# Patient Record
Sex: Female | Born: 1945 | Race: White | Hispanic: No | Smoking: Never smoker
Health system: Southern US, Community
[De-identification: ages and names within clinical notes are randomized; demographics above are authoritative.]

## PROBLEM LIST (undated history)

## (undated) DIAGNOSIS — T4145XA Adverse effect of unspecified anesthetic, initial encounter: Secondary | ICD-10-CM

## (undated) DIAGNOSIS — R21 Rash and other nonspecific skin eruption: Secondary | ICD-10-CM

## (undated) DIAGNOSIS — K219 Gastro-esophageal reflux disease without esophagitis: Secondary | ICD-10-CM

## (undated) DIAGNOSIS — R002 Palpitations: Secondary | ICD-10-CM

## (undated) DIAGNOSIS — J387 Other diseases of larynx: Secondary | ICD-10-CM

## (undated) DIAGNOSIS — G243 Spasmodic torticollis: Secondary | ICD-10-CM

## (undated) DIAGNOSIS — T8859XA Other complications of anesthesia, initial encounter: Secondary | ICD-10-CM

## (undated) DIAGNOSIS — A048 Other specified bacterial intestinal infections: Secondary | ICD-10-CM

## (undated) DIAGNOSIS — K5730 Diverticulosis of large intestine without perforation or abscess without bleeding: Secondary | ICD-10-CM

## (undated) DIAGNOSIS — K449 Diaphragmatic hernia without obstruction or gangrene: Secondary | ICD-10-CM

## (undated) DIAGNOSIS — L309 Dermatitis, unspecified: Secondary | ICD-10-CM

## (undated) DIAGNOSIS — Z98811 Dental restoration status: Secondary | ICD-10-CM

## (undated) DIAGNOSIS — Z87442 Personal history of urinary calculi: Secondary | ICD-10-CM

## (undated) DIAGNOSIS — R0982 Postnasal drip: Secondary | ICD-10-CM

## (undated) DIAGNOSIS — R1310 Dysphagia, unspecified: Secondary | ICD-10-CM

## (undated) DIAGNOSIS — I10 Essential (primary) hypertension: Secondary | ICD-10-CM

## (undated) DIAGNOSIS — E785 Hyperlipidemia, unspecified: Secondary | ICD-10-CM

## (undated) DIAGNOSIS — F419 Anxiety disorder, unspecified: Secondary | ICD-10-CM

## (undated) HISTORY — DX: Gastro-esophageal reflux disease without esophagitis: K21.9

## (undated) HISTORY — DX: Diverticulosis of large intestine without perforation or abscess without bleeding: K57.30

## (undated) HISTORY — DX: Hyperlipidemia, unspecified: E78.5

## (undated) HISTORY — DX: Other specified bacterial intestinal infections: A04.8

## (undated) HISTORY — DX: Diaphragmatic hernia without obstruction or gangrene: K44.9

---

## 1981-07-08 HISTORY — PX: TRANSPHENOIDAL / TRANSNASAL HYPOPHYSECTOMY / RESECTION PITUITARY TUMOR: SUR1382

## 1990-07-08 HISTORY — PX: OOPHORECTOMY: SHX86

## 1991-07-09 HISTORY — PX: ABDOMINAL HYSTERECTOMY: SHX81

## 1999-11-22 ENCOUNTER — Encounter: Admission: RE | Admit: 1999-11-22 | Discharge: 1999-11-22 | Payer: Self-pay | Admitting: Urology

## 1999-11-22 ENCOUNTER — Encounter: Payer: Self-pay | Admitting: Urology

## 2000-01-16 ENCOUNTER — Encounter: Payer: Self-pay | Admitting: Internal Medicine

## 2000-01-16 ENCOUNTER — Encounter: Payer: Self-pay | Admitting: Orthopedic Surgery

## 2000-01-16 ENCOUNTER — Inpatient Hospital Stay (HOSPITAL_COMMUNITY): Admission: EM | Admit: 2000-01-16 | Discharge: 2000-01-17 | Payer: Self-pay | Admitting: Internal Medicine

## 2000-01-16 HISTORY — PX: ORIF ULNAR FRACTURE: SHX5417

## 2000-04-28 ENCOUNTER — Encounter: Payer: Self-pay | Admitting: Gastroenterology

## 2000-04-28 ENCOUNTER — Encounter (INDEPENDENT_AMBULATORY_CARE_PROVIDER_SITE_OTHER): Payer: Self-pay

## 2000-04-28 ENCOUNTER — Other Ambulatory Visit: Admission: RE | Admit: 2000-04-28 | Discharge: 2000-04-28 | Payer: Self-pay | Admitting: Gastroenterology

## 2000-05-28 ENCOUNTER — Encounter: Admission: RE | Admit: 2000-05-28 | Discharge: 2000-05-28 | Payer: Self-pay | Admitting: Urology

## 2000-05-28 ENCOUNTER — Encounter: Payer: Self-pay | Admitting: Urology

## 2000-06-17 ENCOUNTER — Encounter: Admission: RE | Admit: 2000-06-17 | Discharge: 2000-06-17 | Payer: Self-pay | Admitting: Urology

## 2000-06-17 ENCOUNTER — Encounter: Payer: Self-pay | Admitting: Urology

## 2000-12-10 ENCOUNTER — Encounter: Payer: Self-pay | Admitting: Urology

## 2000-12-10 ENCOUNTER — Encounter: Admission: RE | Admit: 2000-12-10 | Discharge: 2000-12-10 | Payer: Self-pay | Admitting: Urology

## 2001-09-11 ENCOUNTER — Encounter: Admission: RE | Admit: 2001-09-11 | Discharge: 2001-09-11 | Payer: Self-pay | Admitting: Internal Medicine

## 2001-09-11 ENCOUNTER — Encounter: Payer: Self-pay | Admitting: Internal Medicine

## 2001-12-01 ENCOUNTER — Encounter: Admission: RE | Admit: 2001-12-01 | Discharge: 2001-12-01 | Payer: Self-pay | Admitting: Urology

## 2001-12-01 ENCOUNTER — Encounter: Payer: Self-pay | Admitting: Urology

## 2003-03-23 ENCOUNTER — Ambulatory Visit (HOSPITAL_COMMUNITY): Admission: RE | Admit: 2003-03-23 | Discharge: 2003-03-23 | Payer: Self-pay | Admitting: Gastroenterology

## 2003-03-23 ENCOUNTER — Encounter: Payer: Self-pay | Admitting: Gastroenterology

## 2004-06-19 ENCOUNTER — Ambulatory Visit: Payer: Self-pay | Admitting: Internal Medicine

## 2004-07-12 ENCOUNTER — Ambulatory Visit: Payer: Self-pay | Admitting: Internal Medicine

## 2004-07-20 ENCOUNTER — Ambulatory Visit: Payer: Self-pay | Admitting: Internal Medicine

## 2004-07-20 ENCOUNTER — Ambulatory Visit: Payer: Self-pay | Admitting: Family Medicine

## 2004-08-14 ENCOUNTER — Ambulatory Visit: Payer: Self-pay | Admitting: Internal Medicine

## 2004-08-24 ENCOUNTER — Ambulatory Visit: Payer: Self-pay | Admitting: Internal Medicine

## 2004-09-11 ENCOUNTER — Ambulatory Visit (HOSPITAL_COMMUNITY): Admission: RE | Admit: 2004-09-11 | Discharge: 2004-09-11 | Payer: Self-pay | Admitting: Internal Medicine

## 2004-09-11 ENCOUNTER — Ambulatory Visit: Payer: Self-pay | Admitting: Internal Medicine

## 2004-09-26 ENCOUNTER — Encounter: Admission: RE | Admit: 2004-09-26 | Discharge: 2004-09-26 | Payer: Self-pay | Admitting: Internal Medicine

## 2004-10-22 ENCOUNTER — Ambulatory Visit: Payer: Self-pay | Admitting: Internal Medicine

## 2005-01-10 ENCOUNTER — Ambulatory Visit: Payer: Self-pay | Admitting: Internal Medicine

## 2005-01-22 ENCOUNTER — Ambulatory Visit: Payer: Self-pay | Admitting: Internal Medicine

## 2005-03-01 ENCOUNTER — Ambulatory Visit: Payer: Self-pay | Admitting: Internal Medicine

## 2005-03-04 ENCOUNTER — Ambulatory Visit: Payer: Self-pay

## 2005-03-05 ENCOUNTER — Ambulatory Visit: Payer: Self-pay

## 2005-04-01 ENCOUNTER — Ambulatory Visit: Payer: Self-pay | Admitting: Internal Medicine

## 2005-04-12 ENCOUNTER — Ambulatory Visit: Payer: Self-pay | Admitting: Internal Medicine

## 2005-04-16 ENCOUNTER — Ambulatory Visit: Payer: Self-pay | Admitting: Gastroenterology

## 2005-05-03 ENCOUNTER — Ambulatory Visit: Payer: Self-pay | Admitting: Internal Medicine

## 2005-07-08 HISTORY — PX: CATARACT EXTRACTION, BILATERAL: SHX1313

## 2005-08-01 ENCOUNTER — Ambulatory Visit: Payer: Self-pay | Admitting: Internal Medicine

## 2005-08-20 ENCOUNTER — Ambulatory Visit: Payer: Self-pay | Admitting: Gastroenterology

## 2005-10-16 ENCOUNTER — Ambulatory Visit: Payer: Self-pay | Admitting: Gastroenterology

## 2005-10-28 ENCOUNTER — Ambulatory Visit: Payer: Self-pay | Admitting: Gastroenterology

## 2005-10-28 LAB — HM COLONOSCOPY

## 2006-03-04 ENCOUNTER — Ambulatory Visit: Payer: Self-pay | Admitting: Internal Medicine

## 2006-03-11 ENCOUNTER — Ambulatory Visit: Payer: Self-pay | Admitting: Internal Medicine

## 2006-04-25 ENCOUNTER — Ambulatory Visit: Payer: Self-pay | Admitting: Internal Medicine

## 2006-12-26 ENCOUNTER — Ambulatory Visit: Payer: Self-pay | Admitting: Internal Medicine

## 2006-12-26 LAB — CONVERTED CEMR LAB
ALT: 32 units/L (ref 0–40)
AST: 28 units/L (ref 0–37)
Albumin: 4 g/dL (ref 3.5–5.2)
Alkaline Phosphatase: 53 units/L (ref 39–117)
BUN: 12 mg/dL (ref 6–23)
Basophils Absolute: 0 10*3/uL (ref 0.0–0.1)
Basophils Relative: 0.1 % (ref 0.0–1.0)
CO2: 34 meq/L — ABNORMAL HIGH (ref 19–32)
Calcium: 9.6 mg/dL (ref 8.4–10.5)
Chloride: 104 meq/L (ref 96–112)
Creatinine, Ser: 0.6 mg/dL (ref 0.4–1.2)
Eosinophils Absolute: 0.3 10*3/uL (ref 0.0–0.6)
Eosinophils Relative: 6.3 % — ABNORMAL HIGH (ref 0.0–5.0)
GFR calc Af Amer: 131 mL/min
GFR calc non Af Amer: 108 mL/min
Glucose, Bld: 97 mg/dL (ref 70–99)
HCT: 42.8 % (ref 36.0–46.0)
Hemoglobin: 14.5 g/dL (ref 12.0–15.0)
Hgb A1c MFr Bld: 5.9 % (ref 4.6–6.0)
Lymphocytes Relative: 18.2 % (ref 12.0–46.0)
MCHC: 34 g/dL (ref 30.0–36.0)
MCV: 88.9 fL (ref 78.0–100.0)
Monocytes Absolute: 0.3 10*3/uL (ref 0.2–0.7)
Monocytes Relative: 7.2 % (ref 3.0–11.0)
Neutro Abs: 3.3 10*3/uL (ref 1.4–7.7)
Neutrophils Relative %: 68.2 % (ref 43.0–77.0)
Platelets: 220 10*3/uL (ref 150–400)
Potassium: 3.6 meq/L (ref 3.5–5.1)
RBC: 4.81 M/uL (ref 3.87–5.11)
RDW: 13.5 % (ref 11.5–14.6)
Sodium: 141 meq/L (ref 135–145)
TSH: 4.09 microintl units/mL (ref 0.35–5.50)
Total Bilirubin: 2 mg/dL — ABNORMAL HIGH (ref 0.3–1.2)
Total Protein: 7.2 g/dL (ref 6.0–8.3)
WBC: 4.8 10*3/uL (ref 4.5–10.5)

## 2007-01-24 ENCOUNTER — Encounter: Payer: Self-pay | Admitting: Internal Medicine

## 2007-01-24 DIAGNOSIS — F32A Depression, unspecified: Secondary | ICD-10-CM | POA: Insufficient documentation

## 2007-01-24 DIAGNOSIS — F329 Major depressive disorder, single episode, unspecified: Secondary | ICD-10-CM

## 2007-01-24 DIAGNOSIS — K219 Gastro-esophageal reflux disease without esophagitis: Secondary | ICD-10-CM | POA: Insufficient documentation

## 2007-01-24 DIAGNOSIS — E785 Hyperlipidemia, unspecified: Secondary | ICD-10-CM | POA: Insufficient documentation

## 2007-01-24 DIAGNOSIS — I10 Essential (primary) hypertension: Secondary | ICD-10-CM | POA: Insufficient documentation

## 2007-01-24 DIAGNOSIS — Z87442 Personal history of urinary calculi: Secondary | ICD-10-CM | POA: Insufficient documentation

## 2007-03-04 ENCOUNTER — Ambulatory Visit: Payer: Self-pay | Admitting: Gastroenterology

## 2007-04-01 ENCOUNTER — Ambulatory Visit: Payer: Self-pay | Admitting: Internal Medicine

## 2007-04-01 LAB — CONVERTED CEMR LAB
ALT: 27 units/L (ref 0–35)
AST: 24 units/L (ref 0–37)
Alkaline Phosphatase: 58 units/L (ref 39–117)
Cholesterol: 189 mg/dL (ref 0–200)
HDL: 56.2 mg/dL (ref >39.0)
LDL Cholesterol: 114 mg/dL — ABNORMAL HIGH (ref 0–99)
Total Bilirubin: 2.1 mg/dL — ABNORMAL HIGH (ref 0.3–1.2)
Total CHOL/HDL Ratio: 3.4
Triglycerides: 93 mg/dL (ref 0–149)
VLDL: 19 mg/dL (ref 0–40)

## 2007-04-20 ENCOUNTER — Ambulatory Visit: Payer: Self-pay | Admitting: Internal Medicine

## 2007-09-02 ENCOUNTER — Telehealth: Payer: Self-pay | Admitting: Internal Medicine

## 2007-10-07 DIAGNOSIS — K5730 Diverticulosis of large intestine without perforation or abscess without bleeding: Secondary | ICD-10-CM | POA: Insufficient documentation

## 2007-11-16 ENCOUNTER — Telehealth: Payer: Self-pay | Admitting: Internal Medicine

## 2008-02-19 ENCOUNTER — Encounter: Payer: Self-pay | Admitting: Internal Medicine

## 2008-03-03 ENCOUNTER — Encounter: Payer: Self-pay | Admitting: Internal Medicine

## 2008-03-28 ENCOUNTER — Ambulatory Visit: Payer: Self-pay | Admitting: Internal Medicine

## 2008-03-28 ENCOUNTER — Telehealth: Payer: Self-pay | Admitting: Gastroenterology

## 2008-03-28 DIAGNOSIS — M810 Age-related osteoporosis without current pathological fracture: Secondary | ICD-10-CM | POA: Insufficient documentation

## 2008-04-12 ENCOUNTER — Ambulatory Visit: Payer: Self-pay | Admitting: Internal Medicine

## 2008-05-16 ENCOUNTER — Telehealth: Payer: Self-pay | Admitting: Gastroenterology

## 2008-06-14 ENCOUNTER — Telehealth: Payer: Self-pay | Admitting: Internal Medicine

## 2008-06-14 ENCOUNTER — Ambulatory Visit: Payer: Self-pay | Admitting: Gastroenterology

## 2009-01-23 ENCOUNTER — Telehealth: Payer: Self-pay | Admitting: Family Medicine

## 2009-03-23 ENCOUNTER — Telehealth: Payer: Self-pay | Admitting: Gastroenterology

## 2009-03-28 ENCOUNTER — Telehealth: Payer: Self-pay | Admitting: Internal Medicine

## 2009-05-01 ENCOUNTER — Ambulatory Visit: Payer: Self-pay | Admitting: Internal Medicine

## 2009-06-07 ENCOUNTER — Ambulatory Visit: Payer: Self-pay | Admitting: Internal Medicine

## 2009-06-07 DIAGNOSIS — R040 Epistaxis: Secondary | ICD-10-CM | POA: Insufficient documentation

## 2009-06-07 LAB — CONVERTED CEMR LAB
Albumin: 4.2 g/dL (ref 3.5–5.2)
Alkaline Phosphatase: 57 units/L (ref 39–117)
BUN: 10 mg/dL (ref 6–23)
Basophils Relative: 0.2 % (ref 0.0–3.0)
CO2: 34 meq/L — ABNORMAL HIGH (ref 19–32)
Chloride: 101 meq/L (ref 96–112)
Cholesterol: 210 mg/dL — ABNORMAL HIGH (ref 0–200)
Hemoglobin: 14.1 g/dL (ref 12.0–15.0)
Lymphocytes Relative: 15.7 % (ref 12.0–46.0)
Monocytes Relative: 5 % (ref 3.0–12.0)
Neutro Abs: 5.3 10*3/uL (ref 1.4–7.7)
Potassium: 3.5 meq/L (ref 3.5–5.1)
RBC: 4.58 M/uL (ref 3.87–5.11)
TSH: 3.33 microintl units/mL (ref 0.35–5.50)
Total CHOL/HDL Ratio: 3
Triglycerides: 92 mg/dL (ref 0.0–149.0)
VLDL: 18.4 mg/dL (ref 0.0–40.0)

## 2009-06-10 ENCOUNTER — Encounter: Payer: Self-pay | Admitting: Internal Medicine

## 2009-06-16 ENCOUNTER — Encounter: Payer: Self-pay | Admitting: Internal Medicine

## 2009-06-21 ENCOUNTER — Telehealth (INDEPENDENT_AMBULATORY_CARE_PROVIDER_SITE_OTHER): Payer: Self-pay | Admitting: *Deleted

## 2009-09-27 ENCOUNTER — Telehealth: Payer: Self-pay | Admitting: Gastroenterology

## 2009-10-03 ENCOUNTER — Telehealth: Payer: Self-pay | Admitting: Internal Medicine

## 2009-11-02 ENCOUNTER — Telehealth: Payer: Self-pay | Admitting: Internal Medicine

## 2009-11-03 ENCOUNTER — Ambulatory Visit: Payer: Self-pay | Admitting: Internal Medicine

## 2009-12-29 ENCOUNTER — Ambulatory Visit: Payer: Self-pay | Admitting: Internal Medicine

## 2009-12-29 DIAGNOSIS — J309 Allergic rhinitis, unspecified: Secondary | ICD-10-CM | POA: Insufficient documentation

## 2010-01-11 ENCOUNTER — Telehealth: Payer: Self-pay | Admitting: Internal Medicine

## 2010-03-30 ENCOUNTER — Ambulatory Visit: Payer: Self-pay | Admitting: Internal Medicine

## 2010-04-24 ENCOUNTER — Telehealth: Payer: Self-pay | Admitting: Internal Medicine

## 2010-05-03 ENCOUNTER — Telehealth: Payer: Self-pay | Admitting: Gastroenterology

## 2010-05-24 ENCOUNTER — Encounter (INDEPENDENT_AMBULATORY_CARE_PROVIDER_SITE_OTHER): Payer: Self-pay | Admitting: *Deleted

## 2010-05-24 ENCOUNTER — Ambulatory Visit: Payer: Self-pay | Admitting: Gastroenterology

## 2010-05-24 DIAGNOSIS — M436 Torticollis: Secondary | ICD-10-CM | POA: Insufficient documentation

## 2010-07-23 ENCOUNTER — Ambulatory Visit: Admit: 2010-07-23 | Payer: Self-pay | Admitting: Gastroenterology

## 2010-07-29 ENCOUNTER — Encounter: Payer: Self-pay | Admitting: Otolaryngology

## 2010-07-29 ENCOUNTER — Encounter: Payer: Self-pay | Admitting: Internal Medicine

## 2010-08-07 NOTE — Progress Notes (Signed)
Summary: Switch Aciphex to a Generic   Medications Added OMEPRAZOLE 20 MG CPDR (OMEPRAZOLE) 1 by mouth qd       Phone Note Call from Patient Call back at Home Phone [phone] Call back at or cell [phone]   Call For: DR Jacqualine Weichel Reason for Call: Talk to Nurse Summary of Call: Only has 5 aciphex pills left. Uses [facility] mail order. They told her that if she was to switch to generic her medicine would be free. She has been paying $70 for the aciphex so would really like to try to get the free generic instead. Initial call taken by: Leanor Kail [facility],  September 27, 2009 8:25 AM  Follow-up for Phone Call        any is .Marland Kitchenok Follow-up by: Mardella Layman MD FACG,  September 27, 2009 8:38 AM  Additional Follow-up for Phone Call Additional follow up Details #1::        Med changed to omeprazole.  Rxsent to [facility]  Pt notified. Additional Follow-up by: Ashok Cordia RN,  September 27, 2009 8:53 AM    New/Updated Medications: OMEPRAZOLE 20 MG CPDR (OMEPRAZOLE) 1 by mouth qd Prescriptions: OMEPRAZOLE 20 MG CPDR (OMEPRAZOLE) 1 by mouth qd  #90 x 3   Entered by:   Ashok Cordia RN   Authorized by:   Mardella Layman MD [facility]   Signed by:   Ashok Cordia RN on 09/27/2009   Method used:   Electronically to        [facility]* (mail-order)             ,          Ph: [phone]       Fax: [phone]   RxID:   [id number]

## 2010-08-07 NOTE — Letter (Signed)
Summary: [facility] Instructions  [facility]  [address] [place], [place] [address]   Phone: [phone]  Fax: [phone]       JANILLE DRAUGHON    [birth year]    MRN: [id number]        Procedure Day /Date: Monday 07/23/2010     Arrival Time: 9:30am     Procedure Time: 10:30am     Location of Procedure:                    X  [facility] (4th Floor)                        PREPARATION FOR COLONOSCOPY WITH MOVIPREP   Starting 5 days prior to your procedure 07/18/2010 do not eat nuts, seeds, popcorn, corn, beans, peas,  salads, or any raw vegetables.  Do not take any fiber supplements (e.g. Metamucil, Citrucel, and Benefiber).  THE DAY BEFORE YOUR PROCEDURE         DATE: 07/22/2010  DAY: Sunday  1.  Drink clear liquids the entire day-NO SOLID FOOD  2.  Do not drink anything colored red or purple.  Avoid juices with pulp.  No orange juice.  3.  Drink at least 64 oz. (8 glasses) of fluid/clear liquids during the day to prevent dehydration and help the prep work efficiently.  CLEAR LIQUIDS INCLUDE: Water Jello Ice Popsicles Tea (sugar ok, no milk/cream) Powdered fruit flavored drinks Coffee (sugar ok, no milk/cream) Gatorade Juice: apple, white grape, white cranberry  Lemonade Clear bullion, consomm, broth Carbonated beverages (any kind) Strained chicken noodle soup Hard Candy                             4.  In the morning, mix first dose of MoviPrep solution:    Empty 1 Pouch A and 1 Pouch B into the disposable container    Add lukewarm drinking water to the top line of the container. Mix to dissolve    Refrigerate (mixed solution should be used within 24 hrs)  5.  Begin drinking the prep at 5:00 p.m. The MoviPrep container is divided by 4 marks.   Every 15 minutes drink the solution down to the next mark (approximately 8 oz) until the full liter is complete.   6.  Follow completed prep with 16 oz of clear liquid of your choice (Nothing  red or purple).  Continue to drink clear liquids until bedtime.  7.  Before going to bed, mix second dose of MoviPrep solution:    Empty 1 Pouch A and 1 Pouch B into the disposable container    Add lukewarm drinking water to the top line of the container. Mix to dissolve    Refrigerate  THE DAY OF YOUR PROCEDURE      DATE: 07/23/2010  DAY: Monday  Beginning at 5:30am (5 hours before procedure):         1. Every 15 minutes, drink the solution down to the next mark (approx 8 oz) until the full liter is complete.  2. Follow completed prep with 16 oz. of clear liquid of your choice.    3. You may drink clear liquids until 8:30am (2 HOURS BEFORE PROCEDURE).   MEDICATION INSTRUCTIONS  Unless otherwise instructed, you should take regular prescription medications with a small sip of water   as early as possible the morning of your procedure.  OTHER INSTRUCTIONS  You will need a responsible adult at least 66 years of age to accompany you and drive you home.   This person must remain in the waiting room during your procedure.  Wear loose fitting clothing that is easily removed.  Leave jewelry and other valuables at home.  However, you may wish to bring a book to read or  an iPod/MP3 player to listen to music as you wait for your procedure to start.  Remove all body piercing jewelry and leave at home.  Total time from sign-in until discharge is approximately 2-3 hours.  You should go home directly after your procedure and rest.  You can resume normal activities the  day after your procedure.  The day of your procedure you should not:   Drive   Make legal decisions   Operate machinery   Drink alcohol   Return to work  You will receive specific instructions about eating, activities and medications before you leave.    The above instructions have been reviewed and explained to me by   _______________________    I fully understand and can verbalize these  instructions _____________________________ Date _________

## 2010-08-07 NOTE — Assessment & Plan Note (Signed)
Summary: flu shot-lb   Nurse Visit   Allergies: 1)  ! Codeine 2)  ! Penicillin  Orders Added: 1)  Flu Vaccine 66yrs + MEDICARE PATIENTS [Q2039] 2)  Administration Flu vaccine - MCR [G0008]      Flu Vaccine Consent Questions     Do you have a history of severe allergic reactions to this vaccine? no    Any prior history of allergic reactions to egg and/or gelatin? no    Do you have a sensitivity to the preservative Thimersol? no    Do you have a past history of Guillan-Barre Syndrome? no    Do you currently have an acute febrile illness? no    Have you ever had a severe reaction to latex? no    Vaccine information given and explained to patient? yes    Are you currently pregnant? no    Lot Number:[id number]   Exp Date:01/05/2011   Site Given  Left Deltoid IMu

## 2010-08-07 NOTE — Progress Notes (Signed)
  Phone Note Refill Request Message from:  Patient on January 11, 2010 8:26 AM     New/Updated Medications: VERAMYST 27.5 MCG/SPRAY SUSP (FLUTICASONE FUROATE) 1 spray two times a day as needed Prescriptions: VERAMYST 27.5 MCG/SPRAY SUSP (FLUTICASONE FUROATE) 1 spray two times a day as needed  #1 month x 3   Entered by:   Ami Bullins CMA   Authorized by:   Jacques Navy MD   Signed by:   Bill Salinas CMA on 01/11/2010   Method used:   [facility] [phone][facility] (retail)       [address]       [place], [place]  [address]       Ph: [phone]       Fax: [phone]   RxID:   [id number]

## 2010-08-07 NOTE — Assessment & Plan Note (Signed)
Summary: cough,cold/cd   Vital Signs:  Patient profile:   65 year old female Height:      63 inches Weight:      189 pounds BMI:     33.60 O2 Sat:      95 % on Room air Temp:     98.1 degrees F oral Pulse rate:   105 / minute BP sitting:   148 / 82  (left arm) Cuff size:   regular  Vitals Entered By: Bill Salinas CMA (November 03, 2009 1:52 PM)  O2 Flow:  Room air  Primary Care Provider:  Jacques Navy MD   History of Present Illness: Patient with a cold with sinus congestion, headach, gagging and choking cough associated with chest pain. Her sputum has been tenacious but clear. She had been taking robitussin at home. She had been instructed to take 1 tsp robitussin, loratadine and mucinex.   Current Medications (verified): 1)  Tums Calcium For Life Bone 750 Mg  Chew (Calcium Carbonate Antacid) .... 2-4 Once Daily 2)  Simvastatin 20 Mg Tabs (Simvastatin) .[name].. 1 By Mouth Once Daily 3)  Hydrochlorothiazide 25 Mg Tabs (Hydrochlorothiazide) .[name].. 1 Once Daily 4)  Omeprazole 20 Mg Cpdr (Omeprazole) .[name].. 1 By Mouth Qd 5)  Tums 500 Mg  Chew (Calcium Carbonate Antacid) .... As Needed 6)  Vitamin D 1000 Unit Tabs (Cholecalciferol) .... Once Daily 7)  Hyomax-Ft 0.125 Mg Tbdp (Hyoscyamine Sulfate) .... Take One By Mouth As Needed 8)  Tussin Dm 100-10 Mg/44ml Syrp (Dextromethorphan-Guaifenesin) .... As Needed 9)  Claritin 10 Mg Tabs (Loratadine) .... As Needed  Allergies (verified): 1)  ! Codeine 2)  ! Penicillin  Past History:  Past Medical History: Last updated: 31-Mar-2008 HIATAL HERNIA (ICD-553.3) HELICOBACTER PYLORI INFECTION, HX OF (ICD-V12.71) ESOPHAGITIS, REFLUX (ICD-530.11) DIVERTICULOSIS, COLON (ICD-562.10) DEPRESSION (ICD-311) GERD (ICD-530.81) RENAL CALCULUS, HX OF (ICD-V13.01) HYPERTENSION (ICD-401.9) HYPERLIPIDEMIA (ICD-272.4)  Past Surgical History: Last updated: 06/14/2008 Hysterectomy pituitary tumor surgery  arm surgery c section  Family History: Last  updated: 03-31-08 father - deceased @ 50: ITP, HTN mother - deceased @ 73: CAD/MI, colon cancer,  Breast cancer Neg - DM in first degree kin  Social History: Last updated: 03-31-2008 HSG married -'1- 39yrs, divorced 1 daughter - '85; no grandchildren work: part-time for [employer] - alone, but adjoins daughter  Review of Systems       The patient complains of fever, hoarseness, chest pain, and prolonged cough.  The patient denies anorexia, weight loss, weight gain, dyspnea on exertion, peripheral edema, hemoptysis, abdominal pain, incontinence, muscle weakness, difficulty walking, unusual weight change, and angioedema.    Physical Exam  General:  WNWD white female in no distress Head:  normocephalic and atraumatic.   Eyes:  vision grossly intact, pupils equal, pupils round, and corneas and lenses clear.   Ears:  R ear normal and L ear normal.   Nose:  no external deformity and no external erythema.   Mouth:  no oral lesions, posterior pharynx Neck:  supple, full ROM, and no thyromegaly.   Chest Wall:  no deformities.   Lungs:  normal respiratory effort, no intercostal retractions, no accessory muscle use, normal breath sounds, no crackles, and no wheezes.   Heart:  regualr tachycardia, II/VI systolic murmur. Msk:  normal ROM, no joint tenderness, and no redness over joints.   Pulses:  2+ radial pulse Neurologic:  alert & oriented X3, cranial nerves II-XII intact, gait normal, and DTRs symmetrical and normal.  Skin:  turgor normal.   Cervical Nodes:  no anterior cervical adenopathy and no posterior cervical adenopathy.   Psych:  Oriented X3, normally interactive, and good eye contact.     Impression & Recommendations:  Problem # 1:  VIRAL URI (ICD-465.9) Patient presentatiion and exam c/w viral URI.  Plan - supportive care - see pt. instr.   Her updated medication list for this problem includes:    Benzonatate 100 Mg Caps (Benzonatate) .[name]... 1 by  mouth three times a day for cough    Claritin 10 Mg Tabs (Loratadine) .[name]... As needed  Complete Medication List: 1)  Tums Calcium For Life Bone 750 Mg Chew (Calcium carbonate antacid) .... 2-4 once daily 2)  Simvastatin 20 Mg Tabs (Simvastatin) .[name].. 1 by mouth once daily 3)  Hydrochlorothiazide 25 Mg Tabs (Hydrochlorothiazide) .[name].. 1 once daily 4)  Omeprazole 20 Mg Cpdr (Omeprazole) .[name].. 1 by mouth qd 5)  Tums 500 Mg Chew (Calcium carbonate antacid) .... As needed 6)  Vitamin D 1000 Unit Tabs (Cholecalciferol) .... Once daily 7)  Hyomax-ft 0.125 Mg Tbdp (Hyoscyamine sulfate) .... Take one by mouth as needed 8)  Benzonatate 100 Mg Caps (Benzonatate) .[name].. 1 by mouth three times a day for cough 9)  Claritin 10 Mg Tabs (Loratadine) .... As needed  Patient Instructions: 1)  Viral upper respiratory infection - no sign of bacterial infection, thus no need for antibiotics. Plan - phenergan with codein cough syrup 1 tsp every 6 hours as needed for cough; benzonatate 100mg  three times a day for cough;  mucinex two tablets AM and PM to help thin secretions; sudafed 30mg  three times a day as needed for sinus congestion; hydrate; vitamin C 1500mg  daily.  Prescriptions: BENZONATATE 100 MG CAPS (BENZONATATE) 1 by mouth three times a day for cough  #30 x 1   Entered and Authorized by:   Jacques Navy MD   Signed by:   Jacques Navy MD on 11/03/2009   Method used:   Print then Give to Patient   RxID:   [id number]

## 2010-08-07 NOTE — Progress Notes (Signed)
Summary: Mucus  Phone Note Call from Patient   Summary of Call: Pt has office visit w/Dr Bernardina Cacho for eval tomorrow pm. She is req suggestions for tonight. C/o cough and increasing amounts of mucus causing gagging. Wants to know if it is ok to try benadryl tonight or other suggestions?  Initial call taken by: Lamar Sprinkles, CMA,  November 02, 2009 3:20 PM  Follow-up for Phone Call        OK to try loratadine 10mg  daily, take dose tonight, for drainage; Robitussin DM ( or equivalent) 1 tsp q 6 hours.   patient called. Follow-up by: Jacques Navy MD,  November 02, 2009 6:42 PM

## 2010-08-07 NOTE — Progress Notes (Signed)
Summary: renew rx   Phone Note Call from Patient Call back at Home Phone [phone]   Caller: Patient Call For: Dr. Jarold Motto Reason for Call: Talk to Nurse Summary of Call: needs rx for [facility]cyamine [facility] [place] Initial call taken by: Vallarie Mare,  May 03, 2010 11:04 AM  Follow-up for Phone Call        rx refilled and pt made an appt for 05/24/2010 Follow-up by: Harlow Mares CMA (AAMA),  May 03, 2010 11:35 AM    Prescriptions: HYOMAX-FT 0.125 MG TBDP (HYOSCYAMINE SULFATE) take one by mouth as needed  #90 x 0   Entered by:   Harlow Mares CMA (AAMA)   Authorized by:   Mardella Layman MD [facility]   Signed by:   Harlow Mares CMA (AAMA) on 05/03/2010   Method used:   [facility]        [facility] [phone]* (retail)       [address]       [place], [place]  [address]       Ph: [phone]       Fax: [phone]   RxID:   [id number]

## 2010-08-07 NOTE — Progress Notes (Signed)
Summary: Omeprazole  Phone Note Call from Patient   Summary of Call: Pt was changed from aciphex to omeprazole. She wants to make sure Dr Debby Bud thinks this is an ok change keeping in mind her current medications.  Initial call taken by: Lamar Sprinkles, CMA,  October 03, 2009 3:31 PM  Follow-up for Phone Call        omperazole, a 1st cousin of aciphex, is just fine. May need to take two = 40mg  to see the same efficacy as aciphex. Can have Rx for # 60 with as needed refills Follow-up by: Jacques Navy MD,  October 04, 2009 8:59 AM  Additional Follow-up for Phone Call Additional follow up Details #1::        Patient notified and states that Dr. Jarold Motto already gave her an prescription. Additional Follow-up by: Lucious Groves,  October 04, 2009 9:48 AM

## 2010-08-07 NOTE — Progress Notes (Signed)
Summary: Simvastatin  Phone Note Call from Patient   Summary of Call: Pt has been cutting simvastatin 40 in half. She is req rx fro 20 mg tabs to go to [facility]. OK?  Initial call taken by: Lamar Sprinkles, CMA,  October 03, 2009 10:06 AM  Follow-up for Phone Call        OK  simvastatin 20mg  # 90 refill x 3, sig 1 by mouth qPM Follow-up by: Jacques Navy MD,  October 03, 2009 12:47 PM    New/Updated Medications: SIMVASTATIN 20 MG TABS (SIMVASTATIN) 1 by mouth once daily Prescriptions: SIMVASTATIN 20 MG TABS (SIMVASTATIN) 1 by mouth once daily  #90 x 3   Entered by:   Lamar Sprinkles, CMA   Authorized by:   Jacques Navy MD   Signed by:   Lamar Sprinkles, CMA on 10/03/2009   Method used:   Faxed to ...       [facility]* (mail-order)             ,          Ph: [phone]       Fax: [phone]   RxID:   [id number]

## 2010-08-07 NOTE — Assessment & Plan Note (Signed)
Summary: follow up 530.81/562.10/lk    History of Present Illness Visit Type: Follow-up Visit Primary GI MD: Sheryn Bison MD FACP FAGA Primary Provider: Jacques Navy MD Chief Complaint: GERD , no problems; She wants to set up colon screening in Jan. 2012 History of Present Illness:   Very pleasant 65 year old Caucasian female with chronic gastroesophageal reflux disease doing well on omeprazole 20 mg a day. She also has diverticulosis coli, IBS, and uses Levsin on a p.r.n. basis. She has a family history colon cancer and is due for followup colonoscopy exam. She denies lower bowel problems at this time. She is followed closely by Dr. Illene Regulus.   GI Review of Systems    Reports acid reflux.      Denies abdominal pain, belching, bloating, chest pain, dysphagia with liquids, dysphagia with solids, heartburn, loss of appetite, nausea, vomiting, vomiting blood, weight loss, and  weight gain.        Denies anal fissure, black tarry stools, change in bowel habit, constipation, diarrhea, diverticulosis, fecal incontinence, heme positive stool, hemorrhoids, irritable bowel syndrome, jaundice, light color stool, liver problems, rectal bleeding, and  rectal pain.    Current Medications (verified): 1)  Tums Calcium For Life Bone 750 Mg  Chew (Calcium Carbonate Antacid) .... 2-4 Once Daily 2)  Simvastatin 20 Mg Tabs (Simvastatin) .[name].. 1 By Mouth Once Daily 3)  Hydrochlorothiazide 25 Mg Tabs (Hydrochlorothiazide) .[name].. 1 Once Daily 4)  Omeprazole 20 Mg Cpdr (Omeprazole) .[name].. 1 By Mouth Qd 5)  Tums 500 Mg  Chew (Calcium Carbonate Antacid) .... As Needed 6)  Vitamin D 1000 Unit Tabs (Cholecalciferol) .... Once Daily 7)  Hyomax-Ft 0.125 Mg Tbdp (Hyoscyamine Sulfate) .... Take One By Mouth As Needed 8)  Claritin 10 Mg Tabs (Loratadine) .... As Needed  Allergies (verified): 1)  ! Codeine 2)  ! Penicillin  Past History:  Past medical, surgical, family and social histories (including risk  factors) reviewed for relevance to current acute and chronic problems.  Past Medical History: Reviewed history from 03/28/2008 and no changes required. HIATAL HERNIA (ICD-553.3) HELICOBACTER PYLORI INFECTION, HX OF (ICD-V12.71) ESOPHAGITIS, REFLUX (ICD-530.11) DIVERTICULOSIS, COLON (ICD-562.10) DEPRESSION (ICD-311) GERD (ICD-530.81) RENAL CALCULUS, HX OF (ICD-V13.01) HYPERTENSION (ICD-401.9) HYPERLIPIDEMIA (ICD-272.4)  Past Surgical History: Reviewed history from 06/14/2008 and no changes required. Hysterectomy pituitary tumor surgery  arm surgery c section  Family History: Reviewed history from 03/28/2008 and no changes required. father - deceased @ 38: ITP, HTN mother - deceased @ 42: CAD/MI, colon cancer,  Breast cancer Neg - DM in first degree kin  Social History: Reviewed history from 03/28/2008 and no changes required. HSG married -'82- 59yrs, divorced 1 daughter - '85; no grandchildren work: part-time for [employer] - alone, but adjoins daughter  Review of Systems       The patient complains of allergy/sinus and arthritis/joint pain.  The patient denies anemia, anxiety-new, back pain, blood in urine, breast changes/lumps, change in vision, confusion, cough, coughing up blood, depression-new, fainting, fatigue, fever, headaches-new, hearing problems, heart murmur, heart rhythm changes, itching, menstrual pain, muscle pains/cramps, night sweats, nosebleeds, pregnancy symptoms, shortness of breath, skin rash, sleeping problems, sore throat, swelling of feet/legs, swollen lymph glands, thirst - excessive , urination - excessive , urination changes/pain, urine leakage, vision changes, and voice change.    Vital Signs:  Patient profile:   65 year old female Height:      63 inches Weight:      193.13 pounds BMI:  34.34 Pulse rate:   60 / minute Pulse rhythm:   regular BP sitting:   136 / 78  (left arm) Cuff size:   regular  Vitals Entered By:  June Mc[place]ray CMA Duncan Dull) (May 24, 2010 10:41 AM)  Physical Exam  General:  Well developed, well nourished, no acute distress.healthy appearing.  healthy appearing.   Head:  Normocephalic and atraumatic. Eyes:  PERRLA, no icterus.exam deferred to patient's ophthalmologist.  exam deferred to patient's ophthalmologist.   Lungs:  Clear throughout to auscultation. Heart:  Regular rate and rhythm; no murmurs, rubs,  or bruits. Abdomen:  Soft, nontender and nondistended. No masses, hepatosplenomegaly or hernias noted. Normal bowel sounds. Extremities:  No clubbing, cyanosis, edema or deformities noted. Neurologic:  Alert and  oriented x4;  grossly normal neurologically. Psych:  Alert and cooperative. Normal mood and affect.   Impression & Recommendations:  Problem # 1:  ADENOCARCINOMA, COLON, FAMILY HX (ICD-V16.0) Assessment Unchanged Colonoscopy scheduled at her convenience. She does have some mild hemorrhoid problems and we will examine her rectum closed at the time of her colonoscopy.  Problem # 2:  GERD (ICD-530.81) Assessment: Improved continue reflect regime and daily omeprazole therapy. She does not need endoscopy at this time. Reflect regime again reviewed with patient.  Other Orders: Colonoscopy (Colon)  Patient Instructions: 1)  Copy sent to : Jacques Navy MD 2)  Your prescription(s) have been sent to you pharmacy.  3)  Your procedure has been scheduled for 07/23/2010, please follow the seperate instructions.  4)  Please continue current medications.  5)  [facility] Patient Information Guide given to patient.  6)  Colonoscopy and Flexible Sigmoidoscopy brochure given.  7)  Diet should be high in fiber ( fruits, vegetables, whole grains) but low in residue. Drink at least eight (8) glasses of water a day.  8)  The medication list was reviewed and reconciled.  All changed / newly prescribed medications were explained.  A complete medication list was provided  to the patient / caregiver. Prescriptions: HYOMAX-FT 0.125 MG TBDP (HYOSCYAMINE SULFATE) take one by mouth as needed  #90 x 3   Entered by:   Harlow Mares CMA (AAMA)   Authorized by:   Mardella Layman MD [facility]   Signed by:   Harlow Mares CMA (AAMA) on 05/24/2010   Method used:   [facility] [phone]* (retail)       [address]       [place], [place]  [address]       Ph: [phone]       Fax: [phone]   RxID:   [id number] MOVIPREP 100 GM  SOLR (PEG-KCL-NACL-NASULF-NA ASC-C) As per prep instructions.  #1 x 0   Entered by:   Harlow Mares CMA (AAMA)   Authorized by:   Mardella Layman MD [facility]   Signed by:   Harlow Mares CMA (AAMA) on 05/24/2010   Method used:   [facility]        [facility] [phone]* (retail)       [address]       [place], [place]  [address]       Ph: [phone]       Fax: [phone]   RxID:   [id number]

## 2010-08-07 NOTE — Assessment & Plan Note (Signed)
Summary: SINUS PRESSURE/CONGESTION/CD   Vital Signs:  Patient profile:   65 year old female Height:      63 inches Weight:      190 pounds BMI:     33.78 O2 Sat:      96 % on Room air Temp:     97.5 degrees F oral Pulse rate:   89 / minute BP sitting:   126 / 78  (left arm) Cuff size:   regular  Vitals Entered By: Bill Salinas CMA (December 29, 2009 10:08 AM)  O2 Flow:  Room air CC: pt here with c/o sinus drip, sinus pressure and hoarsness/ pt has seen two ENT mds that she is unhappy with. She has an appt sch with Dr Patterson Hammersmith ENT at [facility]     Last PAP Result pt has had hysterectomy   Primary Care Provider:  Jacques Navy MD  CC:  pt here with c/o sinus drip and sinus pressure and hoarsness/ pt has seen two ENT mds that she is unhappy with. She has an appt sch with Dr Patterson Hammersmith ENT at [facility].  History of Present Illness: Patient presents for evaluation of persistent nasal drainage, sinus congestion and laryngitis. She has seen Dr. Pollyann Kennedy in December - normal examination including vocal chords. she had use flonase nasal spray only once or twice and then stopped after developing epistaxis. Of note: she continues to have epistaxis when not using nasal spray. She has not had any fever, or purlulent drainage. She does work in a Administrator, arts" environment raising the possibility of alergy related problem.  She does report that the sinus drainage has a terrible odor and bad taste.   Current Medications (verified): 1)  Tums Calcium For Life Bone 750 Mg  Chew (Calcium Carbonate Antacid) .... 2-4 Once Daily 2)  Simvastatin 20 Mg Tabs (Simvastatin) .[name].. 1 By Mouth Once Daily 3)  Hydrochlorothiazide 25 Mg Tabs (Hydrochlorothiazide) .[name].. 1 Once Daily 4)  Omeprazole 20 Mg Cpdr (Omeprazole) .[name].. 1 By Mouth Qd 5)  Tums 500 Mg  Chew (Calcium Carbonate Antacid) .... As Needed 6)  Vitamin D 1000 Unit Tabs (Cholecalciferol) .... Once Daily 7)  Hyomax-Ft 0.125 Mg Tbdp (Hyoscyamine Sulfate) .... Take One By  Mouth As Needed 8)  Benzonatate 100 Mg Caps (Benzonatate) .[name].. 1 By Mouth Three Times A Day For Cough 9)  Claritin 10 Mg Tabs (Loratadine) .... As Needed  Allergies (verified): 1)  ! Codeine 2)  ! Penicillin  Past History:  Past Medical History: Last updated: 04/02/2008 HIATAL HERNIA (ICD-553.3) HELICOBACTER PYLORI INFECTION, HX OF (ICD-V12.71) ESOPHAGITIS, REFLUX (ICD-530.11) DIVERTICULOSIS, COLON (ICD-562.10) DEPRESSION (ICD-311) GERD (ICD-530.81) RENAL CALCULUS, HX OF (ICD-V13.01) HYPERTENSION (ICD-401.9) HYPERLIPIDEMIA (ICD-272.4)  Past Surgical History: Last updated: 06/14/2008 Hysterectomy pituitary tumor surgery  arm surgery c section  Family History: Last updated: 04/02/2008 father - deceased @ 80: ITP, HTN mother - deceased @ 55: CAD/MI, colon cancer,  Breast cancer Neg - DM in first degree kin  Social History: Last updated: 2008-04-02 HSG married -'56- 81yrs, divorced 1 daughter - '85; no grandchildren work: part-time for [employer] - alone, but adjoins daughter  Review of Systems       The patient complains of hoarseness and prolonged cough.  The patient denies anorexia, fever, weight loss, weight gain, chest pain, syncope, dyspnea on exertion, headaches, abdominal pain, muscle weakness, difficulty walking, depression, enlarged lymph nodes, and angioedema.    Physical Exam  General:  alert, well-developed, and  well-nourished.   Head:  no sinus tenderness Eyes:  corneas and lenses clear and no injection.   Neck:  supple and full ROM.   Lungs:  normal respiratory effort and normal breath sounds.   Heart:  normal rate and regular rhythm.   Skin:  turgor normal and color normal.   Cervical Nodes:  no anterior cervical adenopathy and no posterior cervical adenopathy.   Psych:  Oriented X3, memory intact for recent and remote, normally interactive, and good eye contact.     Impression & Recommendations:  Problem # 1:  ALLERGIC  RHINITIS (ICD-477.9) Symptoms are very likely to be allergic or vasomotor rhinnitis. Due to the bad smell and taste of mucus does suggest possibility of infection.  Plan - continue claritin.          trial of veramyst - if successful will Rx fluticasone          continue nasal saline spray           avelox 400mg  once daily x 7 (pcn allergic patient.          Continue omeprazole.   Her updated medication list for this problem includes:    Claritin 10 Mg Tabs (Loratadine) .[name]... As needed  Complete Medication List: 1)  Tums Calcium For Life Bone 750 Mg Chew (Calcium carbonate antacid) .... 2-4 once daily 2)  Simvastatin 20 Mg Tabs (Simvastatin) .[name].. 1 by mouth once daily 3)  Hydrochlorothiazide 25 Mg Tabs (Hydrochlorothiazide) .[name].. 1 once daily 4)  Omeprazole 20 Mg Cpdr (Omeprazole) .[name].. 1 by mouth qd 5)  Tums 500 Mg Chew (Calcium carbonate antacid) .... As needed 6)  Vitamin D 1000 Unit Tabs (Cholecalciferol) .... Once daily 7)  Hyomax-ft 0.125 Mg Tbdp (Hyoscyamine sulfate) .... Take one by mouth as needed 8)  Benzonatate 100 Mg Caps (Benzonatate) .[name].. 1 by mouth three times a day for cough 9)  Claritin 10 Mg Tabs (Loratadine) .... As needed 10)  Avelox 400 Mg Tabs (Moxifloxacin hcl) .[name].. 1 by mouth once daily x 7  Patient Instructions: 1)  laryngitis and post-nasal drip: most likely rhinnitis, either allergic or vasomotor. With the bad smell and taste of mucus may have infection.  Plan - avelox 400mg  once a day x 7 days for infection. Veramyst spray - one spray per nostril once a day (gargle after use). If this works we can prescribe the generic. Continue claritin, may use sudafed as needed, continue nasal saline spray and may use the benzonatate perles as needed.  Prescriptions: AVELOX 400 MG TABS (MOXIFLOXACIN HCL) 1 by mouth once daily x 7  #7 x 0   Entered and Authorized by:   Jacques Navy MD   Signed by:   Jacques Navy MD on 12/29/2009   Method used:   [facility]         [phone][facility] (retail)       [address]       [place], [place]  [address]       Ph: [phone]       Fax: [phone]   RxID:   [id number]    Preventive Care Screening  Pap Smear:    Date:  12/29/2009    Results:  pt has had hysterectomy

## 2010-08-07 NOTE — Progress Notes (Signed)
Summary: Nose pain X 1 day  Phone Note Call from Patient   Caller: Patient Summary of Call: pt left vm stating she woke up yesterday am with her nose hurting on top and a little swollen.  She states it is some better today but she wants to know what she can do for it.  She stopped using veramyst.......Marland Kitchenplease advise Initial call taken by: Lanier Prude, [facility]),  April 24, 2010 11:24 AM  Follow-up for Phone Call        OK to stop veramyst. Warm compresses may help. For persistent pain and swellng - OV Follow-up by: Jacques Navy MD,  April 24, 2010 1:21 PM  Additional Follow-up for Phone Call Additional follow up Details #1::        pt informed  Additional Follow-up by: Lanier Prude, [facility]),  April 24, 2010 2:05 PM

## 2010-08-29 ENCOUNTER — Other Ambulatory Visit: Payer: Self-pay | Admitting: Dermatology

## 2010-09-18 ENCOUNTER — Telehealth: Payer: Self-pay | Admitting: Gastroenterology

## 2010-09-18 ENCOUNTER — Telehealth: Payer: Self-pay | Admitting: Internal Medicine

## 2010-09-25 NOTE — Progress Notes (Signed)
Summary: Omeprazole refill   Phone Note Call from Patient Call back at Home Phone [phone]   Call For: Dr Jarold Motto Reason for Call: Refill Medication Summary of Call: Wants her omeprazole refill. Says nurse already know where to send her script to for her [facility], she didnt know. Initial call taken by: Leanor Kail [facility],  September 18, 2010 8:04 AM  Follow-up for Phone Call        rx sent Follow-up by: Harlow Mares CMA [facility]),  September 18, 2010 8:07 AM    Prescriptions: OMEPRAZOLE 20 MG CPDR (OMEPRAZOLE) 1 by mouth qd  #90 x 3   Entered by:   Harlow Mares CMA (AAMA)   Authorized by:   Mardella Layman MD [facility]   Signed by:   Harlow Mares CMA (AAMA) on 09/18/2010   Method used:   Electronically to        [facility]* (retail)             ,          Ph: [phone]       Fax: [phone]   RxID:   [id number]

## 2010-09-25 NOTE — Progress Notes (Signed)
Summary: REFILL & OV   Phone Note Call from Patient   Summary of Call: Patient is requesting refill of simvastatin to [facility], will be out in 2 wks. Wants to know if she needs labs?  Initial call taken by: Lamar Sprinkles, CMA,  September 18, 2010 9:54 AM  Follow-up for Phone Call        1. last OV 12/10 ! 2. Last labs 12/10! 3. OK to refill simvastatin for 90 days only. 4.  Needs lab: lipid 272.4, hepatic 995.20 , metabolic 401.9 5. Needs full phsyical exam in next 90 days. Follow-up by: Jacques Navy MD,  September 18, 2010 9:37 PM  Additional Follow-up for Phone Call Additional follow up Details #1::        left detailed vm on pt's home #, pt will need office visit w/labs after.  Additional Follow-up by: Lamar Sprinkles, CMA,  September 20, 2010 3:51 PM    Prescriptions: SIMVASTATIN 20 MG TABS (SIMVASTATIN) 1 by mouth once daily  #90 x 0   Entered by:   Lamar Sprinkles, CMA   Authorized by:   Jacques Navy MD   Signed by:   Lamar Sprinkles, CMA on 09/20/2010   Method used:   Electronically to        [facility]* (retail)             ,          Ph: [phone]       Fax: [phone]   RxID:   [id number]

## 2010-10-01 ENCOUNTER — Telehealth: Payer: Self-pay | Admitting: Gastroenterology

## 2010-10-01 ENCOUNTER — Other Ambulatory Visit: Payer: Self-pay | Admitting: Dermatology

## 2010-10-01 MED ORDER — OMEPRAZOLE 20 MG PO CPDR: 20.0000 mg | DELAYED_RELEASE_CAPSULE | Freq: Every day | ORAL | Status: DC

## 2010-10-01 NOTE — Telephone Encounter (Signed)
rx sent to [facility] per pt.

## 2010-10-05 ENCOUNTER — Other Ambulatory Visit: Payer: Self-pay | Admitting: *Deleted

## 2010-10-05 MED ORDER — OMEPRAZOLE 20 MG PO CPDR: 20.0000 mg | DELAYED_RELEASE_CAPSULE | Freq: Every day | ORAL | Status: DC

## 2010-10-05 NOTE — Telephone Encounter (Signed)
Addended by: Harlow Mares on: 10/05/2010 05:03 PM   Modules accepted: Orders

## 2010-10-05 NOTE — Telephone Encounter (Signed)
[facility]  

## 2010-10-08 ENCOUNTER — Telehealth: Payer: Self-pay | Admitting: *Deleted

## 2010-10-08 MED ORDER — SIMVASTATIN 20 MG PO TABS: 20.0000 mg | ORAL_TABLET | Freq: Every day | ORAL | Status: DC

## 2010-10-08 NOTE — Telephone Encounter (Signed)
Call[facility] who stated that they have no information on fill for pt's simvastatin. Called patient who advised that a rx refill is needed. RX sent to [facility]

## 2010-10-08 NOTE — Telephone Encounter (Signed)
Pt left vm - [facility] questions about pt's simvastatin and [facility] @ [phone]

## 2010-10-09 NOTE — Telephone Encounter (Signed)
Rx Error recvd from Pharmacy: Called [facility] and gave VO to pharmacist/John

## 2010-10-25 ENCOUNTER — Other Ambulatory Visit: Payer: Self-pay | Admitting: Otolaryngology

## 2010-10-25 ENCOUNTER — Ambulatory Visit
Admission: RE | Admit: 2010-10-25 | Discharge: 2010-10-25 | Disposition: A | Discharge status: Home / Self Care | Payer: Medicare Other | Source: Ambulatory Visit | Attending: Otolaryngology | Admitting: Otolaryngology

## 2010-10-25 DIAGNOSIS — J329 Chronic sinusitis, unspecified: Secondary | ICD-10-CM

## 2010-11-15 ENCOUNTER — Ambulatory Visit (INDEPENDENT_AMBULATORY_CARE_PROVIDER_SITE_OTHER): Payer: Medicare Other | Admitting: Internal Medicine

## 2010-11-15 ENCOUNTER — Other Ambulatory Visit (INDEPENDENT_AMBULATORY_CARE_PROVIDER_SITE_OTHER): Payer: Medicare Other

## 2010-11-15 VITALS — BP 144/88 | HR 92 | Temp 98.4°F | Wt 188.0 lb

## 2010-11-15 DIAGNOSIS — E859 Amyloidosis, unspecified: Secondary | ICD-10-CM

## 2010-11-15 LAB — CBC WITH DIFFERENTIAL/PLATELET
Basophils Relative: 0.4 % (ref 0.0–3.0)
Eosinophils Relative: 4.7 % (ref 0.0–5.0)
HCT: 42.9 % (ref 36.0–46.0)
Hemoglobin: 14.6 g/dL (ref 12.0–15.0)
Lymphs Abs: 1.2 10*3/uL (ref 0.7–4.0)
MCV: 90.3 fl (ref 78.0–100.0)
Monocytes Absolute: 0.5 10*3/uL (ref 0.1–1.0)
Monocytes Relative: 7 % (ref 3.0–12.0)
Neutro Abs: 4.5 10*3/uL (ref 1.4–7.7)
Platelets: 245 10*3/uL (ref 150.0–400.0)
RBC: 4.75 Mil/uL (ref 3.87–5.11)
WBC: 6.5 10*3/uL (ref 4.5–10.5)

## 2010-11-16 NOTE — Progress Notes (Signed)
  Subjective:    Patient ID: Katie Stout, female    DOB: 1946-01-19, 65 y.o.   MRN: [id number]  HPI Mrs. Nolton recently had ENT evaluation for chronic hoarseness. 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 She is here for further evaluation to rule out any underlying hematologic disease.  I have reviewed the patient's medical history in detail and updated the computerized patient record.    Review of Systems HEENT- no fever, sweats, weight loss Pul - hoarseness. No cough, SOB, DOE Cor - no chest pain or discomfort GI- stable without change MSK normal    Objective:   Physical Exam WNWD white woman in o distress HEENT - hoarse, no further exam - deferred to Dr. Marvetta Gibbons Cor - RRR       Assessment & Plan:  1. 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

## 2010-11-19 LAB — PROTEIN ELECTROPHORESIS, SERUM, WITH REFLEX
Alpha-2-Globulin: 11 % (ref 7.1–11.8)
Beta 2: 5.6 % (ref 3.2–6.5)
Beta Globulin: 7.5 % — ABNORMAL HIGH (ref 4.7–7.2)
Gamma Globulin: 13.5 % (ref 11.1–18.8)
Total Protein, Serum Electrophoresis: 7.4 g/dL (ref 6.0–8.3)

## 2010-11-20 NOTE — Assessment & Plan Note (Signed)
[facility]                         GASTROENTEROLOGY OFFICE NOTE   SHENEKA, SCHROM                      MRN:          [id number]  DATE:03/04/2007                            DOB:          13-May-1946    OFFICE ADD-ON NOTE   COMPLAINT:  Painful swallowing.   HISTORY:  Katie Stout is a pleasant 65 year old white female followed by Dr.  Jarold Motto.  She does have a history of chronic GERD and also  diverticulosis.  She was last seen in April of 2007 when she underwent  colonoscopy.  She did not have any polyps noted at that time, and was  found to have left colon diverticulosis.  Last EGD was done in October  of 2001.  She had reflux esophagitis and a small hiatal hernia.  She has  been on long-term AcipHex 20 mg q. a.m. and says this generally controls  her symptoms well.  Now, over the past 2 to 3 days, she has been having  a burning, raw sensation from her throat all the way to the substernal  area.  She denies any chest pain and says that this does not feel like  her usual acid reflux.  She has been having pain when swallowing as  well, with the burning sensation after swallowing any bolus of food.  She has not had any nausea or vomiting.  No fevers or chills.  She has  not been on any recent antibiotics.  Denies any new medications, but had  been taking Aleve for a couple of days about 4 to 5 days ago.  Over the  past few months, she denies any difficulty with dysphagia or  odynophagia.  She said her symptoms started after a particularly  stressful day, after she had gone for a job interview, which she had not  had to do for the past 33 years.  She has been using Tums in addition to  her AcipHex with little success.   The patient also mentions that off and on over the past couple of years,  she has periodically regurgitated a little piece of food, which she  feels comes up from somewhere in her esophageus.  She says that normally  this smells  awful, like it has been sitting in her esophageus for a long  time.   EXAM:  Well-developed white female in no acute distress.  Weight 193.8, blood pressure 130/86, pulse 88.  CARDIOVASCULAR:  Regular rate and rhythm with S1, S2.  PULMONARY:  Clear to A and P.  ABDOMEN:  Obese, soft.  She is very minimally tender in the subxiphoid  area.  There is no palpable mass or hepatosplenomegaly.  Bowel sounds  are active.  RECTAL:  Not done today.   IMPRESSION:  48. A 65 year old white female with chronic gastroesophageal reflux      disease with a 2 to 3 day history of dysphagia and odynophagia.  I      suspect she has an acute esophagitis, possibly exacerbated by      recent Aleve and rule out esophageal stricture.  2. Family history of colon cancer.  Up to date on colon screening.   PLAN:  1. Increase AcipHex to 20 mg b.i.d. for the next 2 to 3 weeks and then      try to drop back to once daily.  2. Add Carafate slurry 1 g between meals and at bedtime.  3. Schedule EGD with Dr. Jarold Motto and possible esophageal dilation.  4. Advised no aspirin or NSAID for the time being.      Mike Gip, PA-C  Electronically Signed      Barbette Hair. Arlyce Dice, MD,FACG  Electronically Signed   AE/MedQ  DD: 03/04/2007  DT: 03/05/2007  Job #: [id number]

## 2010-11-21 ENCOUNTER — Encounter: Payer: Self-pay | Admitting: Internal Medicine

## 2010-11-23 ENCOUNTER — Telehealth: Payer: Self-pay | Admitting: *Deleted

## 2010-11-23 NOTE — Op Note (Signed)
[place]  Patient:    Katie Stout, Katie Stout                      MRN: [id number] Proc. Date: 01/16/00 Adm. Date:  [id number] Attending:  Twana First                           Operative Report  PREOPERATIVE DIAGNOSIS:  Left ulna fracture with posterior radial head Monteggia fracture dislocation.  POSTOPERATIVE DIAGNOSIS:  Left ulna fracture with posterior radial head Monteggia fracture dislocation.  OPERATION PERFORMED:  Open reduction internal fixation of left ulna fracture and radial head dislocation.  SURGEON:  Elana Alm. Thurston Hole, M.D.  ASSISTANT:  Kirstin Adelberger, P.A.  ANESTHESIA:  General.  OPERATIVE TIME:  One hour.  COMPLICATIONS:  None.  DESCRIPTION OF PROCEDURE:  The patient was brought to the operating room on January 16, 2000 and placed on the operating table in supine position.  After an adequate level of general anesthesia was obtained, her radial head posterior dislocation was reduced closed.  The left arm was prepped using sterile Betadine and draped using sterile technique.  She received vancomycin 1 gm IV preoperatively for prophylaxis.  The arm was exsanguinated and a tourniquet elevated 250 mmHg.  Initially through a 7 cm longitudinal incision based on the posterior ulnar border of the ulna proximally initially exposure was made. The underlying subcutaneous tissues were incised in line with the skin incisoin.  The intermuscular septum was incised longitudinally and the ulnar fracture was exposed.  Ulnar nerve carefully retracted.  The fracture was reduced in an anatomic position and held there while a 7-0 one third tubular plate was placed on the posterior lateral surface and sequentially each of the screw holes were drilled, measured, tapped and the appropriate length 3.5 mm cortical screws placed.  After this was done, firm fixation was achieved.  At this point AP and lateral fluoroscopic x-rays confirmed anatomic  reduction of the fracture, satisfactory position of the hardware and satisfactory reduction of the radial head.  The wound was copiously irrigated and closed using 2-0 Vicryl suture and skin staples.  Sterile dressings were applied and a long arm splint.  Tourniquet was released.  Patient awakened and taken to recovery room in stable condition.  The sponge and needle counts were correct x 2 at the end of the case. DD:  01/16/00 TD:  01/17/00 Job: [id number]

## 2010-11-23 NOTE — Telephone Encounter (Signed)
Please call patient: Katie Stout and protein electrophoresis were normal

## 2010-11-23 NOTE — Telephone Encounter (Signed)
Pt requesting lab results.

## 2010-11-23 NOTE — Telephone Encounter (Signed)
Pt informed by Lamar Sprinkles of results and pending letter sent via mail.

## 2010-11-28 ENCOUNTER — Encounter: Payer: Self-pay | Admitting: Gastroenterology

## 2010-12-04 ENCOUNTER — Ambulatory Visit (INDEPENDENT_AMBULATORY_CARE_PROVIDER_SITE_OTHER): Payer: Medicare Other | Admitting: Internal Medicine

## 2010-12-04 ENCOUNTER — Telehealth: Payer: Self-pay

## 2010-12-04 VITALS — BP 138/76 | HR 105 | Temp 98.3°F | Wt 189.0 lb

## 2010-12-04 DIAGNOSIS — R300 Dysuria: Secondary | ICD-10-CM

## 2010-12-04 DIAGNOSIS — R319 Hematuria, unspecified: Secondary | ICD-10-CM

## 2010-12-04 LAB — POCT URINALYSIS DIPSTICK
Ketones, UA: NEGATIVE
Protein, UA: NEGATIVE
Spec Grav, UA: 1.002
Urobilinogen, UA: 0.2

## 2010-12-04 NOTE — Telephone Encounter (Signed)
[facility] Triage Call Report Triage Record Num: [id number] Operator: Albertine Grates [name] Call Date & Time: 12/03/2010 10:53:27AM Patient Phone: [phone] PCP: Illene Regulus Patient Gender: Female PCP Fax : [phone] Patient DOB: June 10, 1946 Practice Name: [facility] Reason for Call: Has had frequency with urination since 5-27. Has had blood in urine 5-26 but none since. Has pressure in lower abdomen. Afebrile. Has been taking Azo but has not helped. Advised follow up with MD 5-29 per Urinary Symptoms protocol. Protocol(s) Used: Urinary Symptoms - Female Recommended Outcome per Protocol: See Provider within 24 hours Reason for Outcome: Has one or more urinary tract symptoms Care Advice: Increase intake of fluids. Try to drink 8 oz. (.2 liter) every hour when awake, including unsweetened cranberry juice, unless on restricted fluids for other medical reasons. Take sips of fluid or eat ice chips if nauseated or vomiting. ~ Limit carbonated, alcoholic, and caffeinated beverages such as coffee, tea and soda. Avoid nonprescription cold and allergy medications that contain caffeine. Limit intake of tomatoes, fruit juices (except for unsweetened cranberry juice), dairy products, spicy foods, sugar, and artificial sweeteners (aspartame or saccharine). Stop or decrease smoking. Reducing exposure to bladder irritants may help lessen urgency. ~ Systemic Inflammatory Response Syndrome (SIRS): Watch for signs of a generalized, whole body infection. Occurs within days of a localized infection, especially of the urinary, GI, respiratory or nervous systems; or after a traumatic injury or invasive procedure. - Call EMS 911 if symptoms have worsened, such as increasing confusion or unusual drowsiness; cold and clammy skin; no urine output; rapid respiration (>30/min.) or slow respiration (<10/min.); struggling to breathe. - Go to the ED immediately for early symptoms of rapid  pulse >90/min. or rapid breathing >20/min. at rest; chills; oral temperature >100.4 F (38 C) or <96.8 F (36 C) when associated with conditions noted. ~ 12/03/2010 11:04:34AM Page 1 of 1 [id number]

## 2010-12-04 NOTE — Progress Notes (Signed)
  Subjective:    Patient ID: Katie Stout, female    DOB: 12/10/1945, 65 y.o.   MRN: [id number]  HPI Katie Stout had U/A symptoms with bladder spasms and pain. She has a h/o kidney stones with painless hematuria. She has seen Dr. Isabel Caprice for follow-up of stable nephrolithiasis. Starting Saturday, may 27th she had bladder spasms, gross hematuria which resolved in 24 hrs. She has been taking left over anti-spasmodics from Dr. Jarold Motto and drinking a lot of fluids including cranberry juice.  Her symptoms are resolved. She never had any flank or back pain, no fever, no chills.   PMH, FamHx and SocHx reviewed for any changes and relevance.    Review of Systems  Constitutional: Negative.  Negative for fever.  HENT: Negative.   Eyes: Negative.   Respiratory: Negative.   Cardiovascular: Negative.   Gastrointestinal: Negative.   Genitourinary: Positive for dysuria and frequency.  Musculoskeletal: Negative.   Neurological: Negative.   Hematological: Negative.   Psychiatric/Behavioral: Negative.        Objective:   Physical Exam Vitals reviewed Gen'l - WNWD white woman moderately overweight Abdomen - no flank pain, no tenderness to deep palpation right, left and suprapubic regions       Assessment & Plan:  1. Dysuria - U/a negative. Symptoms resolved. No further eval or treatment needed.

## 2010-12-26 ENCOUNTER — Other Ambulatory Visit: Payer: Self-pay | Admitting: Internal Medicine

## 2010-12-26 DIAGNOSIS — E859 Amyloidosis, unspecified: Secondary | ICD-10-CM

## 2010-12-27 ENCOUNTER — Other Ambulatory Visit: Payer: Federal, State, Local not specified - PPO

## 2010-12-27 DIAGNOSIS — E859 Amyloidosis, unspecified: Secondary | ICD-10-CM

## 2010-12-28 ENCOUNTER — Telehealth: Payer: Self-pay | Admitting: *Deleted

## 2010-12-28 LAB — KAPPA/LAMBDA LIGHT CHAINS: Kappa free light chain: 1.37 mg/dL (ref 0.33–1.94)

## 2010-12-28 NOTE — Telephone Encounter (Signed)
Patient will be doing 24 hour urine collection this weekend. She wants to know if she should take HCTZ the am of this collection?

## 2010-12-28 NOTE — Telephone Encounter (Signed)
Patient informed. 

## 2010-12-28 NOTE — Telephone Encounter (Signed)
Taking her HCT is ok

## 2010-12-31 ENCOUNTER — Encounter: Payer: Federal, State, Local not specified - PPO | Admitting: Oncology

## 2010-12-31 ENCOUNTER — Encounter: Payer: Self-pay | Admitting: Internal Medicine

## 2010-12-31 ENCOUNTER — Other Ambulatory Visit: Payer: Federal, State, Local not specified - PPO

## 2010-12-31 ENCOUNTER — Telehealth: Payer: Self-pay | Admitting: Internal Medicine

## 2010-12-31 DIAGNOSIS — E859 Amyloidosis, unspecified: Secondary | ICD-10-CM

## 2010-12-31 NOTE — Telephone Encounter (Signed)
Call patient- test for kappa free light chain -part of the amyloid work-up is normal! Yeah!

## 2010-12-31 NOTE — Telephone Encounter (Signed)
LMOVM for patient to call back.

## 2010-12-31 NOTE — Telephone Encounter (Signed)
Patient informed. 

## 2011-01-01 ENCOUNTER — Telehealth: Payer: Self-pay | Admitting: *Deleted

## 2011-01-01 ENCOUNTER — Ambulatory Visit (INDEPENDENT_AMBULATORY_CARE_PROVIDER_SITE_OTHER): Payer: Medicare Other | Admitting: Internal Medicine

## 2011-01-01 DIAGNOSIS — Z780 Asymptomatic menopausal state: Secondary | ICD-10-CM

## 2011-01-01 DIAGNOSIS — Z0000 Encounter for general adult medical examination without abnormal findings: Secondary | ICD-10-CM

## 2011-01-01 DIAGNOSIS — I10 Essential (primary) hypertension: Secondary | ICD-10-CM

## 2011-01-01 DIAGNOSIS — Z1231 Encounter for screening mammogram for malignant neoplasm of breast: Secondary | ICD-10-CM

## 2011-01-01 DIAGNOSIS — E785 Hyperlipidemia, unspecified: Secondary | ICD-10-CM

## 2011-01-01 DIAGNOSIS — K219 Gastro-esophageal reflux disease without esophagitis: Secondary | ICD-10-CM

## 2011-01-01 DIAGNOSIS — E859 Amyloidosis, unspecified: Secondary | ICD-10-CM

## 2011-01-01 DIAGNOSIS — M810 Age-related osteoporosis without current pathological fracture: Secondary | ICD-10-CM

## 2011-01-01 DIAGNOSIS — Z1211 Encounter for screening for malignant neoplasm of colon: Secondary | ICD-10-CM

## 2011-01-01 NOTE — Progress Notes (Signed)
Subjective:    Patient ID: Katie Stout, female    DOB: 1946/04/24, 65 y.o.   MRN: [id number]  HPI   The patient is here for annual Medicare wellness examination and management of other chronic and acute problems. She has has had biopsy proven amyloidosis of the larynx. She is completing a workup for systemic amyloid that is so far negative with the remaining test being Urine for bence-jones proteins.    The risk factors are reflected in the social history.  The roster of all physicians providing medical care to patient - is listed in the Snapshot section of the chart.  Activities of daily living:  The patient is 100% inedpendent in all ADLs: dressing, toileting, feeding as well as independent mobility  Home safety : The patient has smoke detectors in the home. They wear seatbelts. No firearms at home. There is no violence in the home.   There is no risks for hepatitis, STDs or HIV. There is no   history of blood transfusion. They have no travel history to infectious disease endemic areas of the world.  The patient has seen their dentist in the last six month. They have seen their eye doctor in the last year. They admit to hearing difficulty, right ear,  and have not had audiologic testing in the last year.  They do not  have excessive sun exposure. Discussed the need for sun protection: hats, long sleeves and use of sunscreen if there is significant sun exposure.   Diet: the importance of a healthy diet is discussed. They do have a healthy diet.  The patient has a regular exercise program:which is not consistent walking , 30 duration, 3 times per week.  The benefits of regular aerobic exercise were discussed.  Depression screen: there are no signs or vegative symptoms of depression- irritability, change in appetite, anhedonia, sadness/tearfullness.  Cognitive assessment: the patient manages all their financial and personal affairs and is actively engaged. They could relate  day,date,year and events; recalled 3/3 objects at 3 minutes; performed clock-face test normally.  The following portions of the patient's history were reviewed and updated as appropriate: allergies, current medications, past family history, past medical history,  past surgical history, past social history  and problem list.  Vision, hearing, body mass index were assessed and reviewed.   During the course of the visit the patient was educated and counseled about appropriate screening and preventive services including : fall prevention , diabetes screening, nutrition counseling, colorectal cancer screening, and recommended immunizations.  Past Medical History  Diagnosis Date  . Hiatal hernia   . Helicobacter pylori (H. pylori) infection   . Diverticulosis of colon (without mention of hemorrhage)   . Depression   . GERD (gastroesophageal reflux disease)   . Personal history of urinary calculi   . Unspecified essential hypertension   . Other and unspecified hyperlipidemia    Past Surgical History  Procedure Date  . Abdominal hysterectomy   . 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 Arm surgery   . Cesarean section    Family History  Problem Relation Age of Onset  . Coronary artery disease Mother   . Heart attack Mother   . Breast cancer Mother   . Colon cancer Mother   . Cancer Mother     colon, breast  . Hypertension Father   . Thrombocytopenia Father   . Other Father     ITP   History   Social History  .  Marital Status: Widowed    Spouse Name: N/A    Number of Children: N/A  . Years of Education: N/A   Occupational History  . Cashier    Social History Main Topics  . Smoking status: Not on file  . Smokeless tobacco: Not on file  . Alcohol Use:   . Drug Use:   . Sexually Active:    Other Topics Concern  . Not on file   Social History Narrative   HSGMarried '68-22 years; Divorced1 Daughter - '85; no grandchildrenWork: PT for  [employer]/cashierLives: Alone, but adjoins daughter      Review of Systems Review of Systems  Constitutional:  Negative for fever, chills, activity change and unexpected weight change.  HEENT:  Negative for hearing loss, ear pain, congestion, neck stiffness and postnasal drip. Negative for sore throat or swallowing problems. Negative for dental complaints.   Eyes: Negative for vision loss or change in visual acuity.  Respiratory: Negative for chest tightness and wheezing.   Cardiovascular: Negative for chest pain. Positive for  Palpitations - chronic problem and associated with nervousness. . No decreased exercise tolerance Gastrointestinal: No change in bowel habit. No bloating or gas. No reflux or indigestion Genitourinary: Negative for urgency, frequency, flank pain and difficulty urinating.  Musculoskeletal: Negative for myalgias, back pain, arthralgias and gait problem. OA hands with stiffness and pain - mild. Neurological: Negative for dizziness, tremors, weakness and headaches.  Hematological: Negative for adenopathy.  Psychiatric/Behavioral: Negative for behavioral problems and dysphoric mood.       Objective:   Physical Exam Vitals reviewed and normal Gen'l- overweight white woman in no distress HEENT- [place]/AT, C&S clear, PERRLA, oropharynx without lesion Neck - supple, no thyromegaly Nodes - negative submandibular, cervial, supraclavicular, axillary Breast - pendulous,skin normal, nipples without discharge, breast generally tender but no fixed mass or lesion noted. Pulmonary - normal breath sounds, no increased work of breathing Cor - RRR, no murmurs, rubs or gallops Abdomen - BS+ x 4, no tenderness, guarding or rebound, no HSM. Pelvic - deferred Rectal - deferred Ext - no clubbing cyanosis or edema, no deformity of small, medium or large joints. Neuro - A&O x 3, CN II-XII normal, DTRs 2+ symmetrical Derm - clear  Lab Results  Component Value Date   WBC 6.5 11/15/2010    HGB 14.6 11/15/2010   HCT 42.9 11/15/2010   PLT 245.0 11/15/2010   CHOL 199 01/02/2011   TRIG 119.0 01/02/2011   HDL 68.60 01/02/2011   LDLDIRECT 130.4 06/07/2009   ALT 28 01/02/2011   ALT 28 01/02/2011   AST 24 01/02/2011   AST 24 01/02/2011   NA 141 01/02/2011   K 3.7 01/02/2011   CL 103 01/02/2011   CREATININE 0.6 01/02/2011   BUN 12 01/02/2011   CO2 31 01/02/2011   TSH 3.55 01/02/2011   HGBA1C 5.9 12/26/2006           Assessment & Plan:

## 2011-01-01 NOTE — Telephone Encounter (Signed)
Katie Stout, please assist pt in scheduling this apt, THANKS!!

## 2011-01-01 NOTE — Telephone Encounter (Signed)
Order entered

## 2011-01-01 NOTE — Telephone Encounter (Signed)
Pt states that MD was going to put in order for bone density. No orders were placed, does she need this?

## 2011-01-02 ENCOUNTER — Other Ambulatory Visit (INDEPENDENT_AMBULATORY_CARE_PROVIDER_SITE_OTHER): Payer: Medicare Other

## 2011-01-02 ENCOUNTER — Ambulatory Visit (INDEPENDENT_AMBULATORY_CARE_PROVIDER_SITE_OTHER)
Admission: RE | Admit: 2011-01-02 | Discharge: 2011-01-02 | Disposition: A | Discharge status: Home / Self Care | Payer: Medicare Other | Source: Ambulatory Visit | Attending: Internal Medicine | Admitting: Internal Medicine

## 2011-01-02 DIAGNOSIS — Z0000 Encounter for general adult medical examination without abnormal findings: Secondary | ICD-10-CM | POA: Insufficient documentation

## 2011-01-02 DIAGNOSIS — E785 Hyperlipidemia, unspecified: Secondary | ICD-10-CM

## 2011-01-02 DIAGNOSIS — I10 Essential (primary) hypertension: Secondary | ICD-10-CM

## 2011-01-02 DIAGNOSIS — M810 Age-related osteoporosis without current pathological fracture: Secondary | ICD-10-CM

## 2011-01-02 DIAGNOSIS — Z780 Asymptomatic menopausal state: Secondary | ICD-10-CM

## 2011-01-02 LAB — COMPREHENSIVE METABOLIC PANEL
ALT: 28 U/L (ref 0–35)
AST: 24 U/L (ref 0–37)
CO2: 31 mEq/L (ref 19–32)
Calcium: 9.2 mg/dL (ref 8.4–10.5)
Chloride: 103 mEq/L (ref 96–112)
Creatinine, Ser: 0.6 mg/dL (ref 0.4–1.2)
GFR: 117.67 mL/min (ref >60.00)
Sodium: 141 mEq/L (ref 135–145)
Total Protein: 6.9 g/dL (ref 6.0–8.3)

## 2011-01-02 LAB — HEPATIC FUNCTION PANEL
ALT: 28 U/L (ref 0–35)
Alkaline Phosphatase: 51 U/L (ref 39–117)
Bilirubin, Direct: 0.3 mg/dL (ref 0.0–0.3)
Total Bilirubin: 2.2 mg/dL — ABNORMAL HIGH (ref 0.3–1.2)
Total Protein: 6.9 g/dL (ref 6.0–8.3)

## 2011-01-02 LAB — TSH: TSH: 3.55 u[IU]/mL (ref 0.35–5.50)

## 2011-01-02 LAB — UIFE/LIGHT CHAINS/TP QN, 24-HR UR
Alpha 1, Urine: DETECTED — AB
Alpha 2, Urine: DETECTED — AB
Free Lambda Excretion/Day: 0.24 mg/d
Free Lambda Lt Chains,Ur: 0.01 mg/dL — ABNORMAL LOW (ref 0.02–0.67)
Total Protein, Urine: 0.7 mg/dL

## 2011-01-02 LAB — LIPID PANEL
Cholesterol: 199 mg/dL (ref 0–200)
LDL Cholesterol: 107 mg/dL — ABNORMAL HIGH (ref 0–99)

## 2011-01-02 NOTE — Telephone Encounter (Signed)
Katie Stout,---Gave pt appt:  01/02/11 @ 1130A  For bone density

## 2011-01-02 NOTE — Assessment & Plan Note (Signed)
BP Readings from Last 3 Encounters:  01/01/11 146/82  12/04/10 138/76  11/15/10 144/88   Borderline control.   Plan - patient to monitor BP outside of the office and report back readings. If SBP consistently above 140 will adjust medications

## 2011-01-02 NOTE — Assessment & Plan Note (Signed)
Adequate control on present medical regimen. No adverse side affects.  Plan - continue present medication.

## 2011-01-02 NOTE — Assessment & Plan Note (Signed)
Interval history significant for amyloidosis -see above. With normal additional lab no indication for referral to hematology/oncology at this time. Physical exam is normal except for weight issues. Lab results are within normal limits. She is current with colorectal cancer screenign with last colonoscopy in '07. She last had mammography in '06 and will be referred for study. Immunizations: due for tetanus, pneumonia and shingles vaccine.   Weight management - she acknowledges that this is a problem. Discussed approach: smart food choices, PORTION SIZE CONTROL with frequent small meals, meals of 1000 chews, regular exercise - 3 times a week for 30 minutes with a target heart rate of 180% of resting. Target weight 175; goal is to loose 1 lb per month.  In summary - a delightful woman who appears medical stable. Amyloidosis eval essentially complete. She needs immunizations, bone density study and mammography. She is advised to monitor her blood pressure and report back.

## 2011-01-02 NOTE — Assessment & Plan Note (Signed)
Last DXA '03 and due for follow-up study with recommendations to follow

## 2011-01-02 NOTE — Assessment & Plan Note (Signed)
Stable with no complaint of abdominal pain or problems at this visit

## 2011-01-02 NOTE — Assessment & Plan Note (Signed)
Additional work-up: normal CBC, normal UPEP, SPEP and serum for Kappa free light chains.

## 2011-01-02 NOTE — Progress Notes (Deleted)
  Subjective:    Patient ID: Katie Stout, female    DOB: 1946/02/21, 65 y.o.   MRN: [id number]  HPI    Review of Systems     Objective:   Physical Exam        Assessment & Plan:

## 2011-01-03 ENCOUNTER — Telehealth: Payer: Self-pay | Admitting: Internal Medicine

## 2011-01-03 NOTE — Telephone Encounter (Signed)
Final test is back and is normal in regard to amyloid work-up

## 2011-01-03 NOTE — Telephone Encounter (Signed)
Pt returned call, I informed pt of results

## 2011-01-08 ENCOUNTER — Other Ambulatory Visit: Payer: Self-pay | Admitting: *Deleted

## 2011-01-08 ENCOUNTER — Telehealth: Payer: Self-pay | Admitting: *Deleted

## 2011-01-08 MED ORDER — HYDROCHLOROTHIAZIDE 25 MG PO TABS: 25.0000 mg | ORAL_TABLET | Freq: Every day | ORAL | Status: DC

## 2011-01-08 MED ORDER — SIMVASTATIN 20 MG PO TABS: 20.0000 mg | ORAL_TABLET | Freq: Every day | ORAL | Status: DC

## 2011-01-08 NOTE — Telephone Encounter (Signed)
Left mess to call office back.   

## 2011-01-08 NOTE — Telephone Encounter (Signed)
Patient requesting a call back - has questions about last OV.   Also needs RFs to go to local pharm - done.

## 2011-01-11 NOTE — Telephone Encounter (Signed)
Called patient/no answer// closing phone note until patient calls back

## 2011-01-15 ENCOUNTER — Encounter: Payer: Self-pay | Admitting: Internal Medicine

## 2011-01-25 ENCOUNTER — Ambulatory Visit: Payer: Federal, State, Local not specified - PPO | Admitting: Endocrinology

## 2011-01-30 ENCOUNTER — Telehealth: Payer: Self-pay | Admitting: *Deleted

## 2011-01-30 NOTE — Telephone Encounter (Signed)
##@@**&&###!!!!!!!!!!!!!   cannot find report. I check the tab for DXA it directs me to encounters where I can find no report. Tell Ermalinda Barrios that the EPIC people need to make retrieval of information a 1 click operation ( 2 clicks at the most). If you can pull up the study please call the patient and read the results or mail her a copy.  Boarding my plane  Now - signing off for the day.

## 2011-01-30 NOTE — Telephone Encounter (Signed)
Spoke w/Jilda - Dr Cato Mulligan was reading MD for that date.

## 2011-01-30 NOTE — Telephone Encounter (Signed)
Patient requesting results of bone density.

## 2011-01-31 ENCOUNTER — Telehealth: Payer: Self-pay | Admitting: *Deleted

## 2011-01-31 NOTE — Telephone Encounter (Signed)
Patient informed. 

## 2011-01-31 NOTE — Telephone Encounter (Signed)
Report reprinted and Dr Jonny Ruiz read in Dr Cato Mulligan absence. He says pt has osteopenia, any suggestions for treatment? Med list shows calcium daily.

## 2011-01-31 NOTE — Telephone Encounter (Signed)
Spoke w/patient regarding dexa results. She wanted to inform MD of some issues from last OV notes received.  1. Pulse OX was 98% Not 89 as recorded per patient.  2. HEENT stated NEG for hearing loss but upper documentation stated she had some hearing loss which was correct per patient. 3. She was never a smoker, this was not updated - Done  I explained to patient that we were unable to change the OV notes but will notate in chart her concerns and I will pass info to MD.

## 2011-01-31 NOTE — Telephone Encounter (Signed)
Treatment for osteopenia: 1200mg  daily of calcium (diet plus supplement), vit D 800 iu daily and weight bearing exercise, i.e. Walking.

## 2011-02-01 NOTE — Telephone Encounter (Signed)
Hanks no further documentation needed

## 2011-02-11 ENCOUNTER — Encounter: Payer: Self-pay | Admitting: Internal Medicine

## 2011-02-19 ENCOUNTER — Other Ambulatory Visit: Payer: Self-pay | Admitting: Physician Assistant

## 2011-03-04 ENCOUNTER — Encounter: Payer: Self-pay | Admitting: Internal Medicine

## 2011-03-17 ENCOUNTER — Other Ambulatory Visit: Payer: Self-pay | Admitting: Internal Medicine

## 2011-03-27 ENCOUNTER — Ambulatory Visit: Payer: Federal, State, Local not specified - PPO

## 2011-03-28 ENCOUNTER — Ambulatory Visit (INDEPENDENT_AMBULATORY_CARE_PROVIDER_SITE_OTHER): Payer: Medicare Other | Admitting: *Deleted

## 2011-03-28 DIAGNOSIS — Z23 Encounter for immunization: Secondary | ICD-10-CM

## 2011-06-13 ENCOUNTER — Encounter: Payer: Self-pay | Admitting: Gastroenterology

## 2011-06-21 ENCOUNTER — Encounter: Payer: Self-pay | Admitting: *Deleted

## 2011-06-27 ENCOUNTER — Other Ambulatory Visit (INDEPENDENT_AMBULATORY_CARE_PROVIDER_SITE_OTHER): Payer: Medicare Other

## 2011-06-27 ENCOUNTER — Ambulatory Visit: Payer: Medicare Other | Admitting: Gastroenterology

## 2011-06-27 ENCOUNTER — Encounter: Payer: Self-pay | Admitting: Gastroenterology

## 2011-06-27 VITALS — BP 134/78 | HR 64 | Ht 64.0 in | Wt 180.6 lb

## 2011-06-27 DIAGNOSIS — R6889 Other general symptoms and signs: Secondary | ICD-10-CM

## 2011-06-27 DIAGNOSIS — K5730 Diverticulosis of large intestine without perforation or abscess without bleeding: Secondary | ICD-10-CM

## 2011-06-27 DIAGNOSIS — R141 Gas pain: Secondary | ICD-10-CM

## 2011-06-27 DIAGNOSIS — D509 Iron deficiency anemia, unspecified: Secondary | ICD-10-CM

## 2011-06-27 LAB — VITAMIN B12: Vitamin B-12: 393 pg/mL (ref 211–911)

## 2011-06-27 LAB — IBC PANEL: Saturation Ratios: 20.5 % (ref 20.0–50.0)

## 2011-06-27 LAB — FERRITIN: Ferritin: 9.9 ng/mL — ABNORMAL LOW (ref 10.0–291.0)

## 2011-06-27 LAB — FOLATE: Folate: 9.7 ng/mL (ref >5.9)

## 2011-06-27 MED ORDER — CILIDINIUM-CHLORDIAZEPOXIDE 2.5-5 MG PO CAPS: 1.0000 | ORAL_CAPSULE | Freq: Three times a day (TID) | ORAL | Status: AC | PRN

## 2011-06-27 NOTE — Progress Notes (Signed)
This is a very nice 65 year old Caucasian female with chronic IBS and diverticulosis coli. She also has chronic acid reflux and is on Prilosec 20 mg a day. 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 Evaluation for systemic amyloidosis by Dr. Illene Regulus was negative. Patient perhaps has lactose intolerance with associated postprandial gas and bloating. She really denies diarrhea or constipation, melena, hematochezia, or any hepatobiliary or systemic complaints. A lot of her symptoms do seem to be stress related, and have in the past responded to when necessary Levsin. The patient denies excessive use of other nonabsorbable carbohydrates such as sorbitol or fructose. Her appetite remains good her weight is stable.   Current Medications, Allergies, Past Medical History, Past Surgical History, Family History and Social History were reviewed in [employer] record.  Pertinent Review of Systems Negative   Physical Exam: Awake and alert no acute distress appears stated age. Chest is clear and she seems to be in a regular rhythm without murmurs gallops or rubs. There is no abdominal distention, organomegaly, masses or tenderness. Bowel sounds are nonobstructive, and I cannot appreciate a succussion splash. Peripheral extremities unremarkable mental status is normal.    Assessment and Plan-IBS-type symptomatology with probable lactose intolerance. Patient relates a lot of her problems have begun since she started Prilosec, and we will discontinue this medication since she has minimal if any reflux symptoms. Workup for primary amyloidosis has been negative. She is up-to-date on her colonoscopy exams. I have asked her to take a daily dose of Metamucil, we will use when necessary Librax, high fiber diet as tolerated, and liberal by mouth fluids. She is to call me in 2 weeks for progress report.  :   Encounter Diagnoses  Name Primary?  . Diverticulosis of colon  (without mention of hemorrhage) Yes  . Iron deficiency anemia, unspecified    . Other general symptoms    . Abdominal gas pain

## 2011-06-27 NOTE — Patient Instructions (Signed)
Please go to the basement today for your labs.  Stop Prilosec and Levsin. Take Librax as needed, a rx has been sent to your pharmacy. Start Metamucil once a day. Buy Lactaid two tabs with milk products. Follow the FODMAPS Diet.

## 2011-06-28 ENCOUNTER — Telehealth: Payer: Self-pay | Admitting: Gastroenterology

## 2011-06-28 ENCOUNTER — Telehealth: Payer: Self-pay | Admitting: *Deleted

## 2011-06-28 LAB — CELIAC PANEL 10
Endomysial Screen: NEGATIVE
Gliadin IgG: 9.1 U/mL (ref <20)
IgA: 205 mg/dL (ref 69–380)

## 2011-06-28 MED ORDER — FERROUS FUM-IRON POLYSACCH 162-115.2 MG PO CAPS: 1.0000 | ORAL_CAPSULE | Freq: Every day | ORAL | Status: DC

## 2011-06-28 NOTE — Telephone Encounter (Signed)
Message copied by Leonette Monarch on Fri Jun 28, 2011  3:14 PM ------      Message from: Mardella Layman      Created: Fri Jun 28, 2011 12:45 PM       [employer] daily

## 2011-06-28 NOTE — Telephone Encounter (Signed)
Pt aware, rx sent. 

## 2011-06-28 NOTE — Telephone Encounter (Signed)
Spoke with patient and told her all of the results are not back and that we will call her if any are abnormal

## 2011-07-25 DIAGNOSIS — H04129 Dry eye syndrome of unspecified lacrimal gland: Secondary | ICD-10-CM | POA: Diagnosis not present

## 2011-08-08 ENCOUNTER — Ambulatory Visit: Payer: Medicare Other

## 2011-08-16 ENCOUNTER — Telehealth: Payer: Self-pay | Admitting: Gastroenterology

## 2011-08-16 NOTE — Telephone Encounter (Signed)
Pt reports in December Dr Jarold Motto stopped her Hyoscyamine and placed her on Librax. The librax isn't working as well for her, it makes her tired; the hyoscyamine makes her stomach relax and she feels better. She doesn't take them often and she has a few left until we call her Monday. Can pt switch back to Hyoscyamine? Thanks.

## 2011-08-19 MED ORDER — HYOSCYAMINE SULFATE 0.125 MG SL SUBL: 0.1250 mg | SUBLINGUAL_TABLET | Freq: Four times a day (QID) | SUBLINGUAL | Status: DC | PRN

## 2011-08-19 NOTE — Telephone Encounter (Signed)
levsin is fine

## 2011-08-19 NOTE — Telephone Encounter (Signed)
Notified pt Dr Jarold Motto stated it's ok to order her Levsin; pt stated understanding and it was ordered to her Pharmacy of choice.

## 2011-10-10 DIAGNOSIS — G243 Spasmodic torticollis: Secondary | ICD-10-CM | POA: Diagnosis not present

## 2011-11-06 HISTORY — PX: LARYNX SURGERY: SHX692

## 2011-12-17 ENCOUNTER — Other Ambulatory Visit: Payer: Self-pay | Admitting: Internal Medicine

## 2011-12-17 NOTE — Telephone Encounter (Signed)
Rx refill sent to [facility]..simvastatin

## 2011-12-25 DIAGNOSIS — R490 Dysphonia: Secondary | ICD-10-CM | POA: Diagnosis not present

## 2012-02-13 DIAGNOSIS — M62838 Other muscle spasm: Secondary | ICD-10-CM | POA: Diagnosis not present

## 2012-02-13 DIAGNOSIS — R6889 Other general symptoms and signs: Secondary | ICD-10-CM | POA: Diagnosis not present

## 2012-02-13 DIAGNOSIS — G243 Spasmodic torticollis: Secondary | ICD-10-CM | POA: Diagnosis not present

## 2012-03-04 ENCOUNTER — Ambulatory Visit: Payer: Medicare Other | Admitting: General Practice

## 2012-03-04 DIAGNOSIS — Z23 Encounter for immunization: Secondary | ICD-10-CM

## 2012-03-04 MED ORDER — TETANUS-DIPHTHERIA TOXOIDS TD 5-2 LFU IM INJ: 0.5000 mL | INJECTION | Freq: Once | INTRAMUSCULAR | Status: DC

## 2012-03-16 ENCOUNTER — Other Ambulatory Visit: Payer: Self-pay | Admitting: Internal Medicine

## 2012-03-24 DIAGNOSIS — R1310 Dysphagia, unspecified: Secondary | ICD-10-CM | POA: Diagnosis not present

## 2012-03-24 DIAGNOSIS — R490 Dysphonia: Secondary | ICD-10-CM | POA: Diagnosis not present

## 2012-03-24 DIAGNOSIS — R040 Epistaxis: Secondary | ICD-10-CM | POA: Diagnosis not present

## 2012-03-25 ENCOUNTER — Encounter: Payer: Self-pay | Admitting: Gastroenterology

## 2012-04-06 ENCOUNTER — Encounter: Payer: Self-pay | Admitting: Gastroenterology

## 2012-04-07 DIAGNOSIS — J387 Other diseases of larynx: Secondary | ICD-10-CM

## 2012-04-07 DIAGNOSIS — Z23 Encounter for immunization: Secondary | ICD-10-CM | POA: Diagnosis not present

## 2012-04-07 HISTORY — DX: Other diseases of larynx: J38.7

## 2012-04-10 ENCOUNTER — Encounter (HOSPITAL_BASED_OUTPATIENT_CLINIC_OR_DEPARTMENT_OTHER): Payer: Self-pay | Admitting: *Deleted

## 2012-04-10 DIAGNOSIS — R0982 Postnasal drip: Secondary | ICD-10-CM

## 2012-04-10 HISTORY — DX: Postnasal drip: R09.82

## 2012-04-10 NOTE — H&P (Signed)
PREOPERATIVE H&P  Chief Complaint: hoarseness  HPI: Katie Stout is a 66 y.o. female who presents for evaluation of hoarseness and worsening of her voice. She has history of laryngeal amyloid originally diagnosed on DL and bx on 4/09. Her voice has gradually gotten worse and she's taken back to the OR for repeat DL and bx. She's also had some dysphagia and right sided epistaxis which will be addressed during surgery.   Past Medical History  Diagnosis Date  . Helicobacter pylori (H. pylori) infection   . Diverticulosis of colon (without mention of hemorrhage)   . Depression   . GERD (gastroesophageal reflux disease)   . Personal history of urinary calculi   . Other and unspecified hyperlipidemia   . Post-nasal drip 04/10/2012  . Hypertension     under control, has been on med. x 7 yrs.  . 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 Palpitations     "all my life" - states no problems  . Complication of anesthesia     states had flashing lights x 6 wks. post-op in 1992  . Hiatal hernia   . 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 Dental crowns present    Past Surgical History  Procedure Date  . 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 Cesarean section   . Abdominal hysterectomy 1993    complete  . Oophorectomy 1992    x 1  . Cataract extraction, bilateral 2007  . Orif ulnar fracture 01/16/2000    left: also radial head dislocation  . Larynx surgery 11/2011    vocal cord biopsy   History   Social History  . Marital Status: Widowed    Spouse Name: N/A    Number of Children: 1  . Years of Education: N/A   Occupational History  . Cashier    Social History Main Topics  . Smoking status: Never Smoker   . Smokeless tobacco: Never Used  . Alcohol Use: Yes     Rarely  . Drug Use: No  . Sexually Active: Not on file   Other Topics Concern  . Not on file   Social History  Narrative   HSGMarried '68-22 years; Divorced1 Daughter - '85; no grandchildrenWork: PT for [employer]/cashierLives: Alone, but adjoins daughter   Family History  Problem Relation Age of Onset  . Coronary artery disease Mother   . Heart attack Mother   . Breast cancer Mother   . Colon cancer Mother   . Hypertension Father   . Thrombocytopenia Father   . Other Father     ITP  . Prostate cancer Brother    Allergies  Allergen Reactions  . Penicillins Hives  . Adhesive (Tape) Other (See Comments)    SKIN REDNESS AND IRRITATION  . Codeine Nausea Only   Prior to Admission medications   Medication Sig Start Date End Date Taking? Authorizing Provider  calcium carbonate (TUMS CALCIUM FOR LIFE BONE) 750 MG chewable tablet Chew 2-4 tablets by mouth daily.     Yes Historical Provider, MD  cholecalciferol (VITAMIN D) 1000 UNITS tablet Take 1,000 Units by mouth daily.     Yes Historical Provider, MD  hydrochlorothiazide (HYDRODIURIL) 25 MG tablet TAKE 1 TABLET (25 MG TOTAL) BY MOUTH DAILY. 03/16/12  Yes Jacques Navy, MD  loratadine (CLARITIN) 10 MG tablet Take 10 mg by mouth as needed.     Yes  Historical Provider, MD  simethicone (MYLICON) 125 MG chewable tablet Chew 125 mg by mouth every 6 (six) hours as needed.     Yes Historical Provider, MD  simvastatin (ZOCOR) 20 MG tablet TAKE 1 TABLET (20 MG TOTAL) BY MOUTH AT BEDTIME. 12/17/11  Yes Jacques Navy, MD     Positive ROS: right sided epistaxis and hoarseness  All other systems have been reviewed and were otherwise negative with the exception of those mentioned in the HPI and as above.  Physical Exam: There were no vitals filed for this visit.  General: Alert, no acute distress Oral: Normal oral mucosa and tonsils Nasal: Clear nasal passages. Prominent right septal vessel. FOL revealed increase amyloid mass left side of larynx Neck: No palpable adenopathy or thyroid nodules Ear: Ear canal is clear with normal appearing  TMs Cardiovascular: Regular rate and rhythm, no murmur.  Respiratory: Clear to auscultation Neurologic: Alert and oriented x 3   Assessment/Plan: LARYNGEAL AMYLOID Plan for Procedure(s): MICROLARYNGOSCOPY WITH CO2 LASER AND EXCISION OF VOCAL CORD LESION ESOPHAGOSCOPY EPISTAXIS CONTROL   Dillard Cannon, MD 04/10/2012 3:14 PM

## 2012-04-13 ENCOUNTER — Encounter (HOSPITAL_BASED_OUTPATIENT_CLINIC_OR_DEPARTMENT_OTHER): Payer: Self-pay | Admitting: *Deleted

## 2012-04-13 ENCOUNTER — Encounter (HOSPITAL_BASED_OUTPATIENT_CLINIC_OR_DEPARTMENT_OTHER)
Admission: RE | Admit: 2012-04-13 | Discharge: 2012-04-13 | Disposition: A | Discharge status: Home / Self Care | Payer: Medicare Other | Source: Ambulatory Visit | Attending: Otolaryngology | Admitting: Otolaryngology

## 2012-04-13 ENCOUNTER — Other Ambulatory Visit: Payer: Self-pay

## 2012-04-13 DIAGNOSIS — I10 Essential (primary) hypertension: Secondary | ICD-10-CM | POA: Diagnosis not present

## 2012-04-13 DIAGNOSIS — R040 Epistaxis: Secondary | ICD-10-CM | POA: Diagnosis not present

## 2012-04-13 DIAGNOSIS — R490 Dysphonia: Secondary | ICD-10-CM | POA: Diagnosis not present

## 2012-04-13 DIAGNOSIS — Z01810 Encounter for preprocedural cardiovascular examination: Secondary | ICD-10-CM | POA: Diagnosis not present

## 2012-04-13 DIAGNOSIS — E8589 Other amyloidosis: Secondary | ICD-10-CM | POA: Diagnosis not present

## 2012-04-13 DIAGNOSIS — Z01812 Encounter for preprocedural laboratory examination: Secondary | ICD-10-CM | POA: Diagnosis not present

## 2012-04-13 DIAGNOSIS — R1310 Dysphagia, unspecified: Secondary | ICD-10-CM | POA: Diagnosis not present

## 2012-04-13 LAB — BASIC METABOLIC PANEL
BUN: 12 mg/dL (ref 6–23)
Calcium: 9.9 mg/dL (ref 8.4–10.5)
Chloride: 98 mEq/L (ref 96–112)
Creatinine, Ser: 0.59 mg/dL (ref 0.50–1.10)
Glucose, Bld: 99 mg/dL (ref 70–99)
Potassium: 3.8 mEq/L (ref 3.5–5.1)
Sodium: 140 mEq/L (ref 135–145)

## 2012-04-13 NOTE — Pre-Procedure Instructions (Signed)
To come for BMET and EKG 

## 2012-04-14 ENCOUNTER — Encounter (HOSPITAL_BASED_OUTPATIENT_CLINIC_OR_DEPARTMENT_OTHER): Payer: Self-pay | Admitting: Anesthesiology

## 2012-04-14 ENCOUNTER — Encounter (HOSPITAL_BASED_OUTPATIENT_CLINIC_OR_DEPARTMENT_OTHER): Payer: Self-pay | Admitting: *Deleted

## 2012-04-14 ENCOUNTER — Ambulatory Visit (HOSPITAL_BASED_OUTPATIENT_CLINIC_OR_DEPARTMENT_OTHER): Payer: Medicare Other | Admitting: Anesthesiology

## 2012-04-14 ENCOUNTER — Ambulatory Visit (HOSPITAL_BASED_OUTPATIENT_CLINIC_OR_DEPARTMENT_OTHER)
Admission: RE | Admit: 2012-04-14 | Discharge: 2012-04-14 | Disposition: A | Discharge status: Home / Self Care | Payer: Medicare Other | Source: Ambulatory Visit | Attending: Otolaryngology | Admitting: Otolaryngology

## 2012-04-14 ENCOUNTER — Encounter (HOSPITAL_BASED_OUTPATIENT_CLINIC_OR_DEPARTMENT_OTHER)
Admission: RE | Disposition: A | Discharge status: Home / Self Care | Payer: Self-pay | Source: Ambulatory Visit | Attending: Otolaryngology

## 2012-04-14 DIAGNOSIS — R1310 Dysphagia, unspecified: Secondary | ICD-10-CM | POA: Diagnosis not present

## 2012-04-14 DIAGNOSIS — I10 Essential (primary) hypertension: Secondary | ICD-10-CM | POA: Insufficient documentation

## 2012-04-14 DIAGNOSIS — E8589 Other amyloidosis: Secondary | ICD-10-CM | POA: Diagnosis not present

## 2012-04-14 DIAGNOSIS — Z01810 Encounter for preprocedural cardiovascular examination: Secondary | ICD-10-CM | POA: Insufficient documentation

## 2012-04-14 DIAGNOSIS — E859 Amyloidosis, unspecified: Secondary | ICD-10-CM | POA: Diagnosis not present

## 2012-04-14 DIAGNOSIS — J383 Other diseases of vocal cords: Secondary | ICD-10-CM | POA: Diagnosis not present

## 2012-04-14 DIAGNOSIS — R040 Epistaxis: Secondary | ICD-10-CM | POA: Insufficient documentation

## 2012-04-14 DIAGNOSIS — Z01812 Encounter for preprocedural laboratory examination: Secondary | ICD-10-CM | POA: Insufficient documentation

## 2012-04-14 DIAGNOSIS — R490 Dysphonia: Secondary | ICD-10-CM | POA: Insufficient documentation

## 2012-04-14 HISTORY — DX: Other diseases of larynx: J38.7

## 2012-04-14 HISTORY — DX: Adverse effect of unspecified anesthetic, initial encounter: T41.45XA

## 2012-04-14 HISTORY — DX: Dermatitis, unspecified: L30.9

## 2012-04-14 HISTORY — DX: Palpitations: R00.2

## 2012-04-14 HISTORY — DX: Dental restoration status: Z98.811

## 2012-04-14 HISTORY — DX: Spasmodic torticollis: G24.3

## 2012-04-14 HISTORY — DX: Personal history of urinary calculi: Z87.442

## 2012-04-14 HISTORY — DX: Essential (primary) hypertension: I10

## 2012-04-14 HISTORY — DX: Anxiety disorder, unspecified: F41.9

## 2012-04-14 HISTORY — DX: Postnasal drip: R09.82

## 2012-04-14 HISTORY — DX: Dysphagia, unspecified: R13.10

## 2012-04-14 HISTORY — PX: NASAL HEMORRHAGE CONTROL: SHX287

## 2012-04-14 HISTORY — PX: ESOPHAGOSCOPY: SHX5534

## 2012-04-14 HISTORY — DX: Other complications of anesthesia, initial encounter: T88.59XA

## 2012-04-14 HISTORY — DX: Rash and other nonspecific skin eruption: R21

## 2012-04-14 SURGERY — MICROLARYNGOSCOPY WITH CO2 LASER AND EXCISION OF VOCAL CORD LESION
Anesthesia: General | Site: Throat | Laterality: Right | Wound class: Clean Contaminated

## 2012-04-14 MED ORDER — FENTANYL CITRATE 0.05 MG/ML IJ SOLN
INTRAMUSCULAR | Status: DC | PRN
  Administered 2012-04-14 (×2): 50 ug via INTRAVENOUS

## 2012-04-14 MED ORDER — PROPOFOL 10 MG/ML IV BOLUS
INTRAVENOUS | Status: DC | PRN
  Administered 2012-04-14: 20 mg via INTRAVENOUS
  Administered 2012-04-14: 200 mg via INTRAVENOUS

## 2012-04-14 MED ORDER — LACTATED RINGERS IV SOLN
INTRAVENOUS | Status: DC
  Administered 2012-04-14 (×2): via INTRAVENOUS

## 2012-04-14 MED ORDER — SUCCINYLCHOLINE CHLORIDE 20 MG/ML IJ SOLN
INTRAMUSCULAR | Status: DC | PRN
  Administered 2012-04-14: 100 mg via INTRAVENOUS

## 2012-04-14 MED ORDER — LIDOCAINE HCL (CARDIAC) 20 MG/ML IV SOLN
INTRAVENOUS | Status: DC | PRN
  Administered 2012-04-14: 75 mg via INTRAVENOUS

## 2012-04-14 MED ORDER — BACITRACIN ZINC 500 UNIT/GM EX OINT
TOPICAL_OINTMENT | CUTANEOUS | Status: DC | PRN
  Administered 2012-04-14: 1 via TOPICAL

## 2012-04-14 MED ORDER — ONDANSETRON HCL 4 MG/2ML IJ SOLN
INTRAMUSCULAR | Status: DC | PRN
  Administered 2012-04-14: 4 mg via INTRAVENOUS

## 2012-04-14 MED ORDER — DEXAMETHASONE SODIUM PHOSPHATE 4 MG/ML IJ SOLN
INTRAMUSCULAR | Status: DC | PRN
  Administered 2012-04-14: 10 mg via INTRAVENOUS

## 2012-04-14 MED ORDER — EPINEPHRINE HCL 1 MG/ML IJ SOLN
INTRAMUSCULAR | Status: DC | PRN
  Administered 2012-04-14: 1 mL

## 2012-04-14 MED ORDER — CLARITHROMYCIN 500 MG PO TABS: 500.0000 mg | ORAL_TABLET | Freq: Two times a day (BID) | ORAL | Status: AC

## 2012-04-14 MED ORDER — CLINDAMYCIN PHOSPHATE 600 MG/50ML IV SOLN
INTRAVENOUS | Status: DC | PRN
  Administered 2012-04-14: 600 mg via INTRAVENOUS

## 2012-04-14 MED ORDER — MIDAZOLAM HCL 5 MG/5ML IJ SOLN
INTRAMUSCULAR | Status: DC | PRN
  Administered 2012-04-14: 2 mg via INTRAVENOUS

## 2012-04-14 MED ORDER — HYDROCODONE-ACETAMINOPHEN 5-500 MG PO TABS: 1.0000 | ORAL_TABLET | Freq: Four times a day (QID) | ORAL | Status: DC | PRN

## 2012-04-14 MED ORDER — FENTANYL CITRATE 0.05 MG/ML IJ SOLN
25.0000 ug | INTRAMUSCULAR | Status: DC | PRN
  Administered 2012-04-14 (×2): 50 ug via INTRAVENOUS

## 2012-04-14 SURGICAL SUPPLY — 43 items
APPLICATOR COTTON TIP 6IN STRL (MISCELLANEOUS) ×8 IMPLANT
CANISTER SUCTION 1200CC (MISCELLANEOUS) ×4 IMPLANT
CLOTH BEACON ORANGE TIMEOUT ST (SAFETY) ×4 IMPLANT
COAGULATOR SUCT 8FR VV (MISCELLANEOUS) ×4 IMPLANT
CONT SPEC 4OZ CLIKSEAL STRL BL (MISCELLANEOUS) IMPLANT
CORDS BIPOLAR (ELECTRODE) IMPLANT
DECANTER SPIKE VIAL GLASS SM (MISCELLANEOUS) IMPLANT
DEPRESSOR TONGUE BLADE STERILE (MISCELLANEOUS) ×4 IMPLANT
DRESSING NASAL POPE 10X1.5X2.5 (GAUZE/BANDAGES/DRESSINGS) IMPLANT
DRSG NASAL POPE 10X1.5X2.5 (GAUZE/BANDAGES/DRESSINGS)
DRSG TELFA 3X8 NADH (GAUZE/BANDAGES/DRESSINGS) IMPLANT
ELECT REM PT RETURN 9FT ADLT (ELECTROSURGICAL) ×4
ELECT REM PT RETURN 9FT PED (ELECTROSURGICAL)
ELECTRODE REM PT RETRN 9FT PED (ELECTROSURGICAL) IMPLANT
ELECTRODE REM PT RTRN 9FT ADLT (ELECTROSURGICAL) ×3 IMPLANT
FILTER 7/8 IN (FILTER) ×4 IMPLANT
GAUZE SPONGE 4X4 12PLY STRL LF (GAUZE/BANDAGES/DRESSINGS) ×8 IMPLANT
GLOVE SKINSENSE NS SZ7.0 (GLOVE) ×1
GLOVE SKINSENSE STRL SZ7.0 (GLOVE) ×3 IMPLANT
GLOVE SS BIOGEL STRL SZ 7.5 (GLOVE) ×3 IMPLANT
GLOVE SUPERSENSE BIOGEL SZ 7.5 (GLOVE) ×1
GOWN PREVENTION PLUS XLARGE (GOWN DISPOSABLE) ×4 IMPLANT
GOWN PREVENTION PLUS XXLARGE (GOWN DISPOSABLE) ×4 IMPLANT
GUARD TEETH (MISCELLANEOUS) IMPLANT
HEMOSTAT SURGICEL 2X14 (HEMOSTASIS) IMPLANT
MARKER SKIN DUAL TIP RULER LAB (MISCELLANEOUS) ×4 IMPLANT
NEEDLE 27GAX1X1/2 (NEEDLE) IMPLANT
NEEDLE SPNL 22GX7 QUINCKE BK (NEEDLE) IMPLANT
NS IRRIG 1000ML POUR BTL (IV SOLUTION) ×4 IMPLANT
PACK BASIN DAY SURGERY FS (CUSTOM PROCEDURE TRAY) IMPLANT
PAD EYE OVAL STERILE LF (GAUZE/BANDAGES/DRESSINGS) ×8 IMPLANT
PATTIES SURGICAL .5 X3 (DISPOSABLE) ×4 IMPLANT
REDUCTION FITTING 1/4 IN (FILTER) ×4 IMPLANT
SHEET MEDIUM DRAPE 40X70 STRL (DRAPES) ×4 IMPLANT
SLEEVE SCD COMPRESS KNEE MED (MISCELLANEOUS) ×4 IMPLANT
SOLUTION BUTLER CLEAR DIP (MISCELLANEOUS) ×4 IMPLANT
SPONGE GAUZE 2X2 8PLY STRL LF (GAUZE/BANDAGES/DRESSINGS) IMPLANT
SURGILUBE 2OZ TUBE FLIPTOP (MISCELLANEOUS) ×4 IMPLANT
SYR CONTROL 10ML LL (SYRINGE) IMPLANT
TOWEL OR 17X24 6PK STRL BLUE (TOWEL DISPOSABLE) ×4 IMPLANT
TUBE CONNECTING 20X1/4 (TUBING) ×8 IMPLANT
WATER STERILE IRR 1000ML POUR (IV SOLUTION) ×4 IMPLANT
YANKAUER SUCT BULB TIP NO VENT (SUCTIONS) ×4 IMPLANT

## 2012-04-14 NOTE — Anesthesia Procedure Notes (Signed)
Procedure Name: Intubation Date/Time: 04/14/2012 7:55 AM Performed by: Zenia Resides D Pre-anesthesia Checklist: Patient identified, Emergency Drugs available, Suction available, Patient being monitored and Timeout performed Patient Re-evaluated:Patient Re-evaluated prior to inductionOxygen Delivery Method: Circle System Utilized Preoxygenation: Pre-oxygenation with 100% oxygen Intubation Type: IV induction Ventilation: Mask ventilation without difficulty Grade View: Grade II Tube type: Oral Laser Tube: Laser Tube and Cuffed inflated with minimal occlusive pressure - saline Tube size: 5.5 mm Number of attempts: 1 Airway Equipment and Method: stylet and oral airway Placement Confirmation: ETT inserted through vocal cords under direct vision,  positive ETCO2 and breath sounds checked- equal and bilateral Tube secured with: Tape Dental Injury: Teeth and Oropharynx as per pre-operative assessment

## 2012-04-14 NOTE — Anesthesia Preprocedure Evaluation (Signed)
Anesthesia Evaluation  Patient identified by MRN, date of birth, ID band Patient awake    Reviewed: Allergy & Precautions, H&P , NPO status , Patient's Chart, lab work & pertinent test results  Airway Mallampati: II TM Distance: >3 FB Neck ROM: Full    Dental No notable dental hx. (+) Teeth Intact, Dental Advisory Given and Caps   Pulmonary neg pulmonary ROS,  breath sounds clear to auscultation  Pulmonary exam normal       Cardiovascular hypertension, On Medications Rhythm:Regular Rate:Normal     Neuro/Psych PSYCHIATRIC DISORDERS  Neuromuscular disease    GI/Hepatic Neg liver ROS, hiatal hernia, GERD-  Medicated and Controlled,  Endo/Other  negative endocrine ROS  Renal/GU negative Renal ROS  negative genitourinary   Musculoskeletal   Abdominal   Peds  Hematology negative hematology ROS (+)   Anesthesia Other Findings   Reproductive/Obstetrics negative OB ROS                           Anesthesia Physical Anesthesia Plan  ASA: III  Anesthesia Plan: General   Post-op Pain Management:    Induction: Intravenous  Airway Management Planned: Oral ETT  Additional Equipment:   Intra-op Plan:   Post-operative Plan: Extubation in OR  Informed Consent: I have reviewed the patients History and Physical, chart, labs and discussed the procedure including the risks, benefits and alternatives for the proposed anesthesia with the patient or authorized representative who has indicated his/her understanding and acceptance.   Dental advisory given  Plan Discussed with: CRNA  Anesthesia Plan Comments:         Anesthesia Quick Evaluation

## 2012-04-14 NOTE — Anesthesia Postprocedure Evaluation (Signed)
  Anesthesia Post-op Note  Patient: Lyndal Alamillo Klatt  Procedure(s) Performed: Procedure(s) (LRB) with comments: MICROLARYNGOSCOPY WITH CO2 LASER AND EXCISION OF VOCAL CORD LESION (N/A) ESOPHAGOSCOPY (Right) EPISTAXIS CONTROL (Right) - Cauterization of Right Septal Vessel  Patient Location: PACU  Anesthesia Type: General  Level of Consciousness: awake, alert  and oriented  Airway and Oxygen Therapy: Patient Spontanous Breathing  Post-op Pain: mild  Post-op Assessment: Post-op Vital signs reviewed, Patient's Cardiovascular Status Stable, Respiratory Function Stable, Patent Airway and No signs of Nausea or vomiting  Post-op Vital Signs: Reviewed and stable  Complications: No apparent anesthesia complications

## 2012-04-14 NOTE — Interval H&P Note (Signed)
History and Physical Interval Note:  04/14/2012 7:42 AM  Katie Stout  has presented today for surgery, with the diagnosis of LARYNGEAL AMYLOID  The various methods of treatment have been discussed with the patient and family. After consideration of risks, benefits and other options for treatment, the patient has consented to  Procedure(s) (LRB) with comments: MICROLARYNGOSCOPY WITH CO2 LASER AND EXCISION OF VOCAL CORD LESION (N/A) ESOPHAGOSCOPY (Right) EPISTAXIS CONTROL (Right) - Cauterization of Right Septal Vessel as a surgical intervention .  The patient's history has been reviewed, patient examined, no change in status, stable for surgery.  I have reviewed the patient's chart and labs.  Questions were answered to the patient's satisfaction.     NEWMAN, CHRISTOPHER

## 2012-04-14 NOTE — Transfer of Care (Signed)
Immediate Anesthesia Transfer of Care Note  Patient: Katie Stout  Procedure(s) Performed: Procedure(s) (LRB) with comments: MICROLARYNGOSCOPY WITH CO2 LASER AND EXCISION OF VOCAL CORD LESION (N/A) ESOPHAGOSCOPY (Right) EPISTAXIS CONTROL (Right) - Cauterization of Right Septal Vessel  Patient Location: PACU  Anesthesia Type: General  Level of Consciousness: awake and alert   Airway & Oxygen Therapy: Patient Spontanous Breathing and Patient connected to face mask oxygen  Post-op Assessment: Report given to PACU RN and Post -op Vital signs reviewed and stable  Post vital signs: Reviewed and stable  Complications: No apparent anesthesia complications

## 2012-04-14 NOTE — Brief Op Note (Signed)
04/14/2012  9:06 AM  PATIENT:  Caprice Kluver Amador  66 y.o. female  PRE-OPERATIVE DIAGNOSIS:  LARYNGEAL AMYLOID  POST-OPERATIVE DIAGNOSIS:  LARYNGEAL AMYLOID and Epistaxis  PROCEDURE:  Procedure(s) (LRB) with comments: MICROLARYNGOSCOPY WITH CO2 LASER AND EXCISION OF VOCAL CORD LESION (N/A) ESOPHAGOSCOPY (Right) EPISTAXIS CONTROL (Right) - Cauterization of Right Septal Vessel  SURGEON:  Surgeon(s) and Role:    * Drema Halon, MD - Primary  PHYSICIAN ASSISTANT:   ASSISTANTS: none   ANESTHESIA:   general  EBL:  Total I/O In: 1000 [I.V.:1000] Out: -   BLOOD ADMINISTERED:none  DRAINS: none   LOCAL MEDICATIONS USED:  NONE  SPECIMEN:  Source of Specimen:  left false vocal cord  DISPOSITION OF SPECIMEN:  PATHOLOGY  COUNTS:  YES  TOURNIQUET:  * No tourniquets in log *  DICTATION: .Other Dictation: Dictation Number [id number]  PLAN OF CARE: Discharge to home after PACU  PATIENT DISPOSITION:  PACU - hemodynamically stable.   Delay start of Pharmacological VTE agent (>24hrs) due to surgical blood loss or risk of bleeding: yes

## 2012-04-15 ENCOUNTER — Encounter (HOSPITAL_BASED_OUTPATIENT_CLINIC_OR_DEPARTMENT_OTHER): Payer: Self-pay | Admitting: Otolaryngology

## 2012-04-15 LAB — POCT HEMOGLOBIN-HEMACUE: Hemoglobin: 14.9 g/dL (ref 12.0–15.0)

## 2012-04-15 NOTE — Op Note (Signed)
Katie Stout, Katie Stout NO.:  [id number]  MEDICAL RECORD NO.:  [id number]  LOCATION:                               FACILITY:  [facility]  PHYSICIAN:  Katie Stout. Katie Stout, M.D.DATE OF BIRTH:  09/03/1945  DATE OF PROCEDURE:  04/14/2012 DATE OF DISCHARGE:  04/14/2012                              OPERATIVE REPORT   PREOPERATIVE DIAGNOSES: 1. Laryngeal amyloid with hoarseness. 2. Dysphagia. 3. Right-sided epistaxis.  POSTOPERATIVE DIAGNOSES: 1. Laryngeal amyloid with hoarseness. 2. Dysphagia. 3. Right-sided epistaxis.  OPERATION PERFORMED: 1. Direct laryngoscopy with CO2 laser excision of the left false vocal     cord mass. 2. Cervical esophagoscopy. 3. Cauterization of right septal vessel for epistaxis.  SURGEON:  Katie Stout. Katie Stout, M.D.  ANESTHESIA:  General endotracheal.  COMPLICATIONS:  None.  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  She has had worsening of her voice over the last 6 months.  On exam in office, she has increasing mass and fullness of the left false vocal cord making visualization of the true vocal cord difficult.  She has also had some recent dysphagia following a Botox injections for torticollis of the neck.  In addition to the above problems that she has had repeated right-sided epistaxis and on exam, has a very prominent anterior septal vessel on the right side of the septum.  We will plan on cauterization of this at time of direct laryngoscopy and esophagoscopy.  DESCRIPTION OF PROCEDURE:  After adequate endotracheal anesthesia, first the nose was examined.  The patient had a very prominent vessel along with a little bit area of almost granulation tissue on the right side of the septum.  This was cauterized with silver nitrate and bacitracin ointment was applied.  The remaining nasal passageway was  clear. Following this, esophagoscopy was performed and cervical esophagoscopy, the piriform sinus on the right side.  Upper esophageal sphincter was unobstructed and was able to pass the cervical esophagoscope without too much difficulty.  The upper cervical esophagus was clear of any mucosal abnormalities.  This completed the esophagoscopy and following this, direct laryngoscopy was performed.  The laser laryngoscope was utilized. This was suspended, photos were obtained.  The patient had a mass involving the left false cord making visualization of the true cord very difficult.  Using the CO2 laser set at 5 and 6 watts, the false cord mass was excised.  Over the posterior aspect of the mass, it was very erythematous in color.  The specimen of the mass was sent to Pathology. After removing the false cord mass, the vocal cords better visualized. She had some slight edema of the vocal cord, but the vocal cord itself looked fairly good with no discrete abnormalities and palpation of the vocal cord with the forceps was soft.  There really did not appear that the vocal cord was involved without much amyloid.  Cotton pledgets soaked and adrenaline was placed for hemostasis.  This completed the procedure.  Katie Stout was subsequently awoke from anesthesia and transferred to the recovery room, postop doing well.  DISPOSITION:  Katie Stout is  discharged home later this morning.  I placed her on Biaxin 500 mg b.i.d. for 1 week.  Antacid medication either Nexium or Prilosec and Vicodin for pain.  However, follow up in my office in 2 weeks for recheck.          ______________________________ Katie Stout. Katie Stout, M.D.     CEN/MEDQ  D:  04/14/2012  T:  04/14/2012  Job:  [id number]

## 2012-04-17 ENCOUNTER — Telehealth: Payer: Self-pay | Admitting: Internal Medicine

## 2012-04-17 NOTE — Telephone Encounter (Signed)
Caller: [name]/Patient; [name]; PCP: Illene Regulus (Adults only); Best Callback Phone Number: [phone] States had surgery on larynx 10-8. Has mucus draining down back of throat since after surgery. Has nasal drainage and is draining down throat. Afebrile. Has cough caused by drainage. Has heard wheezing since 10-10. Spoke to surgeon 10-10 and he advised using something to stop drainage. Has started using Mucinex 10-11. Continues to wheeze and feels short of breath. Advised Urgent Care 10-11 as no history of wheezing.

## 2012-04-18 ENCOUNTER — Ambulatory Visit (INDEPENDENT_AMBULATORY_CARE_PROVIDER_SITE_OTHER): Payer: Medicare Other | Admitting: Family Medicine

## 2012-04-18 ENCOUNTER — Encounter: Payer: Self-pay | Admitting: Family Medicine

## 2012-04-18 VITALS — BP 160/80 | HR 96 | Temp 98.0°F | Wt 187.0 lb

## 2012-04-18 DIAGNOSIS — R05 Cough: Secondary | ICD-10-CM

## 2012-04-18 DIAGNOSIS — R059 Cough, unspecified: Secondary | ICD-10-CM | POA: Diagnosis not present

## 2012-04-18 DIAGNOSIS — Z20822 Contact with and (suspected) exposure to covid-19: Secondary | ICD-10-CM | POA: Insufficient documentation

## 2012-04-18 MED ORDER — AZITHROMYCIN 250 MG PO TABS: ORAL_TABLET | ORAL | Status: DC

## 2012-04-18 NOTE — Patient Instructions (Addendum)
Keep an eye on blood pressure (go to store once a week or once every 2 weeks to check).  If consistently >140/90, come in to see Dr. Debby Bud sooner. Continue neil med sinus rinse. May continue alleve. May continue simple mucinex (without decongestant). Prescription for zpack sent into pharmacy - fill if fever or worsening productive cough or worsening in general. If continued cough into next week, return to see Dr. Ezzard Standing. Good to see you today, call us with questions.

## 2012-04-18 NOTE — Progress Notes (Signed)
  Subjective:    Patient ID: Katie Stout, female    DOB: [birth year], 66 y.o.   MRN: [id number]  HPI CC: cough  Recently had vocal cord surgery on Tuesday this week for amyloid growth.  Also had nose bleed cauterized.  She was sent home with clarithromycin 500mg  bid.  Then 4d ago started having coughing with wheezing and shortness of breath.  She stopped antibiotic.  + coughing and gagging from tickle in throat.  + RN and PNdrainage.  + ST.  Cough dry, nonproductive.  Has been using ricola cough drops which have significantly helped.  No fevers/chills, abd pain, n/v, ear or tooth pain.  No h/o asthma, COPD.  No smokers at home.  No sick contacts.  bp elevated today - "i have white coat syndrome".  Does not have cuff at home.  Past Medical History  Diagnosis Date  . Diverticulosis of colon (without mention of hemorrhage)   . GERD (gastroesophageal reflux disease)   . Other and unspecified hyperlipidemia   . Post-nasal drip 04/10/2012  . Hypertension     under control, has been on med. x 7 yrs.  . 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 Palpitations     "all my life" - states no problems; states had stress test 2006; no cardiologist  . Complication of anesthesia     states had flashing lights x 6 wks. post-op in 1992  . Dental crowns present   . History of kidney stones   . Anxiety   . Difficulty swallowing solids     states difficulty swallowing dry foods  . Rash     breasts  . Eczema   . 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 Hiatal hernia     states "small"     Review of Systems Per HPI    Objective:   Physical Exam  Nursing note and vitals reviewed. Constitutional: She appears well-developed and well-nourished. No distress.  HENT:  Head: Normocephalic and atraumatic.  Right Ear: Hearing, tympanic membrane, external ear and ear canal normal.  Left Ear: Hearing, tympanic membrane, external ear and  ear canal normal.  Nose: Rhinorrhea present. No mucosal edema. Right sinus exhibits no maxillary sinus tenderness and no frontal sinus tenderness. Left sinus exhibits no maxillary sinus tenderness and no frontal sinus tenderness.  Mouth/Throat: Uvula is midline and mucous membranes are normal. Posterior oropharyngeal edema present. No oropharyngeal exudate, posterior oropharyngeal erythema or tonsillar abscesses.       Hoarse voice  Eyes: Conjunctivae normal and EOM are normal. Pupils are equal, round, and reactive to light. No scleral icterus.  Neck: Normal range of motion. Neck supple.  Cardiovascular: Normal rate, regular rhythm, normal heart sounds and intact distal pulses.   No murmur heard. Pulmonary/Chest: Effort normal and breath sounds normal. No respiratory distress. She has no wheezes. She has no rales.       Lungs clear  Lymphadenopathy:    She has no cervical adenopathy.  Skin: Skin is warm and dry. No rash noted.       Assessment & Plan:

## 2012-04-18 NOTE — Assessment & Plan Note (Signed)
Unclear etiology after recent vocal cord surgery.  Pt hesitant to use cough syrup and I'm hesitant to use nasal steroid given recent surgery. ?due to expected inflammation after surgery.  exam WNL today. No significant sinus congestion. Will provide with zpack and recommended other supportive measures (see pt instructions). If sxs continued into next week, rec see ENT surgeon Ezzard Standing). Pt agrees with plan.

## 2012-05-18 ENCOUNTER — Encounter: Payer: Medicare Other | Admitting: Gastroenterology

## 2012-05-19 DIAGNOSIS — Z1231 Encounter for screening mammogram for malignant neoplasm of breast: Secondary | ICD-10-CM | POA: Diagnosis not present

## 2012-05-21 DIAGNOSIS — R928 Other abnormal and inconclusive findings on diagnostic imaging of breast: Secondary | ICD-10-CM | POA: Diagnosis not present

## 2012-05-27 ENCOUNTER — Encounter: Payer: Self-pay | Admitting: Internal Medicine

## 2012-06-01 ENCOUNTER — Encounter: Payer: Self-pay | Admitting: Internal Medicine

## 2012-06-10 ENCOUNTER — Encounter: Payer: Self-pay | Admitting: Internal Medicine

## 2012-06-10 ENCOUNTER — Ambulatory Visit (INDEPENDENT_AMBULATORY_CARE_PROVIDER_SITE_OTHER): Payer: Medicare Other | Admitting: Internal Medicine

## 2012-06-10 ENCOUNTER — Other Ambulatory Visit (INDEPENDENT_AMBULATORY_CARE_PROVIDER_SITE_OTHER): Payer: Medicare Other

## 2012-06-10 VITALS — BP 158/82 | HR 98 | Temp 98.2°F | Resp 16 | Ht 64.5 in | Wt 187.5 lb

## 2012-06-10 DIAGNOSIS — I10 Essential (primary) hypertension: Secondary | ICD-10-CM

## 2012-06-10 DIAGNOSIS — Z0000 Encounter for general adult medical examination without abnormal findings: Secondary | ICD-10-CM

## 2012-06-10 DIAGNOSIS — M810 Age-related osteoporosis without current pathological fracture: Secondary | ICD-10-CM

## 2012-06-10 DIAGNOSIS — E785 Hyperlipidemia, unspecified: Secondary | ICD-10-CM | POA: Diagnosis not present

## 2012-06-10 DIAGNOSIS — R259 Unspecified abnormal involuntary movements: Secondary | ICD-10-CM

## 2012-06-10 DIAGNOSIS — D509 Iron deficiency anemia, unspecified: Secondary | ICD-10-CM | POA: Diagnosis not present

## 2012-06-10 DIAGNOSIS — E859 Amyloidosis, unspecified: Secondary | ICD-10-CM

## 2012-06-10 DIAGNOSIS — K219 Gastro-esophageal reflux disease without esophagitis: Secondary | ICD-10-CM

## 2012-06-10 LAB — LIPID PANEL
HDL: 64.7 mg/dL (ref >39.00)
Total CHOL/HDL Ratio: 3
VLDL: 19.8 mg/dL (ref 0.0–40.0)

## 2012-06-10 LAB — HEPATIC FUNCTION PANEL
Bilirubin, Direct: 0.3 mg/dL (ref 0.0–0.3)
Total Bilirubin: 2 mg/dL — ABNORMAL HIGH (ref 0.3–1.2)
Total Protein: 7.3 g/dL (ref 6.0–8.3)

## 2012-06-10 MED ORDER — SERTRALINE HCL 25 MG PO TABS: 25.0000 mg | ORAL_TABLET | Freq: Every day | ORAL | Status: DC

## 2012-06-10 NOTE — Progress Notes (Signed)
Subjective:    Patient ID: Katie Stout, female    DOB: 1946-06-15, 66 y.o.   MRN: [id number]  HPI The patient is here for annual Medicare wellness examination and management of other chronic and acute problems.  Interval notable for resection of laryngeal mass - amyloidosis. She has made a good recovery.  She does report that she has frequent episodes of anxiety. She has had good relief with xanax.   The risk factors are reflected in the social history.  The roster of all physicians providing medical care to patient - is listed in the Snapshot section of the chart.  Activities of daily living:  The patient is 100% inedpendent in all ADLs: dressing, toileting, feeding as well as independent mobility  Home safety : The patient has smoke detectors in the home. Fall - fall safe including grab bars.  They wear seatbelts. No firearms at home   There is no risks for hepatitis, STDs or HIV. There is no history of blood transfusion. They have no travel history to infectious disease endemic areas of the world.  The patient has seen their dentist in the last six month. They have seen their eye doctor in the last year. They admit to  hearing difficulty right ear and have not had audiologic testing in the last year.    They do not  have excessive sun exposure. Discussed the need for sun protection: hats, long sleeves and use of sunscreen if there is significant sun exposure.   Diet: the importance of a healthy diet is discussed. They do have a healthy diet.  The patient has a  regular exercise program: walking , 60 min duration, 3 per week.  The benefits of regular aerobic exercise were discussed.  Depression screen: there are no signs or vegative symptoms of depression- irritability, change in appetite, anhedonia, sadness/tearfullness.  Cognitive assessment: the patient manages all their financial and personal affairs and is actively engaged.   Past Medical History  Diagnosis Date  .  Diverticulosis of colon (without mention of hemorrhage)   . GERD (gastroesophageal reflux disease)   . Other and unspecified hyperlipidemia   . Post-nasal drip 04/10/2012  . Hypertension     under control, has been on med. x 7 yrs.  . 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 Palpitations     "all my life" - states no problems; states had stress test 2006; no cardiologist  . Complication of anesthesia     states had flashing lights x 6 wks. post-op in 1992  . Dental crowns present   . History of kidney stones   . Anxiety   . Difficulty swallowing solids     states difficulty swallowing dry foods  . Rash     breasts  . Eczema   . 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 Hiatal hernia     states "small"   Past Surgical History  Procedure Date  . 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 Cesarean section   . Abdominal hysterectomy 1993    complete  . Oophorectomy 1992    x 1  . Cataract extraction, bilateral 2007  . Orif ulnar fracture 01/16/2000    left: also radial head dislocation  . Larynx surgery 11/2011    vocal cord biopsy  . Esophagoscopy 04/14/2012    Procedure: ESOPHAGOSCOPY;  Surgeon: Drema Halon, MD;  Location: [facility];  Service: ENT;  Laterality: Right;  . Nasal hemorrhage control 04/14/2012    Procedure: EPISTAXIS CONTROL;  Surgeon: Drema Halon, MD;  Location: [facility];  Service: ENT;  Laterality: Right;  Cauterization of Right Septal Vessel   Family History  Problem Relation Age of Onset  . Coronary artery disease Mother   . Heart attack Mother   . Breast cancer Mother   . Colon cancer Mother   . Hypertension Father   . Thrombocytopenia Father   . Other Father     ITP  . Prostate cancer Brother    History   Social History  . Marital Status: Widowed    Spouse Name: N/A    Number of Children: 1  . Years  of Education: N/A   Occupational History  . Cashier    Social History Main Topics  . Smoking status: Never Smoker   . Smokeless tobacco: Never Used  . Alcohol Use: No  . Drug Use: No  . Sexually Active: Not on file   Other Topics Concern  . Not on file   Social History Narrative   HSGMarried '68-22 years; Divorced1 Daughter - '85; no grandchildrenWork: PT for [employer]/cashierLives: Alone, but adjoins daughter    Current Outpatient Prescriptions on File Prior to Visit  Medication Sig Dispense Refill  . calcium carbonate (TUMS CALCIUM FOR LIFE BONE) 750 MG chewable tablet Chew 2-4 tablets by mouth daily.        . cholecalciferol (VITAMIN D) 1000 UNITS tablet Take 1,000 Units by mouth daily.        . hydrochlorothiazide (HYDRODIURIL) 25 MG tablet TAKE 1 TABLET (25 MG TOTAL) BY MOUTH DAILY.  90 tablet  3  . simvastatin (ZOCOR) 20 MG tablet TAKE 1 TABLET (20 MG TOTAL) BY MOUTH AT BEDTIME.  90 tablet  3  . azithromycin (ZITHROMAX Z-PAK) 250 MG tablet Two on day 1 followed by one daily for 4 days for total of 5 days, PO  6 each  0  . HYDROcodone-acetaminophen (VICODIN) 5-500 MG per tablet Take 1-2 tablets by mouth every 6 (six) hours as needed for pain.  24 tablet  0  . loratadine (CLARITIN) 10 MG tablet Take 10 mg by mouth as needed.        [name] omeprazole (PRILOSEC) 20 MG capsule Take 20 mg by mouth daily.      . simethicone (MYLICON) 125 MG chewable tablet Chew 125 mg by mouth every 6 (six) hours as needed.         Current Facility-Administered Medications on File Prior to Visit  Medication Dose Route Frequency Provider Last Rate Last Dose  . tetanus & diphtheria toxoids (adult) (TENIVAC) injection 0.5 mL  0.5 mL Intramuscular Once Jacques Navy, MD         Vision, hearing, body mass index were assessed and reviewed.   During the course of the visit the patient was educated and counseled about appropriate screening and preventive services including : fall prevention , diabetes  screening, nutrition counseling, colorectal cancer screening, and recommended immunizations.    Review of Systems Constitutional:  Negative for fever, chills, activity change and unexpected weight change.  HEENT:  Negative for hearing loss, ear pain, congestion, neck stiffness and postnasal drip. Negative for sore throat or swallowing problems. Negative for dental complaints.   Eyes: Negative for vision loss or change in visual acuity.  Respiratory: Negative for chest tightness and wheezing. Negative for DOE.   Cardiovascular: Negative  for chest pain. Positive for palpitations. No decreased exercise tolerance Gastrointestinal: No change in bowel habit. No bloating or gas. No reflux or indigestion Genitourinary: Negative for urgency, frequency, flank pain and difficulty urinating.  Musculoskeletal: Negative for myalgias, back pain, arthralgias and gait problem.  Neurological: Negative for dizziness, tremors, weakness and headaches.  Hematological: Negative for adenopathy.  Psychiatric/Behavioral: Negative for behavioral problems and dysphoric mood.       Objective:   Physical Exam Filed Vitals:   06/10/12 0915  BP: 158/82  Pulse: 98  Temp: 98.2 F (36.8 C)  Resp: 16   Wt Readings from Last 3 Encounters:  06/10/12 187 lb 8 oz (85.049 kg)  04/18/12 187 lb (84.823 kg)  04/10/12 185 lb (83.915 kg)   Gen'l: well nourished, well developed white Woman in no distress. Voice remains mildly hoarse. HEENT - [place]/AT, EACs/TMs normal, oropharynx with native dentition in good condition, no buccal or palatal lesions, posterior pharynx clear, mucous membranes moist. C&S clear, PERRLA, fundi - normal Neck - supple, no thyromegaly Nodes- negative submental, cervical, supraclavicular regions Chest - no deformity, no CVAT Lungs - cleat without rales, wheezes. No increased work of breathing Breast -deferred Cardiovascular - regular rate and rhythm, quiet precordium, no murmurs, rubs or gallops, 2+  radial, DP and PT pulses Abdomen - BS+ x 4, no HSM, no guarding or rebound or tenderness Pelvic - deferred to gyn Rectal - deferred to gyn Extremities - no clubbing, cyanosis, edema or deformity.  Neuro - A&O x 3, CN II-XII normal, motor strength normal and equal, DTRs 2+ and symmetrical biceps, radial, and patellar tendons. Cerebellar - no tremor, no rigidity, fluid movement and normal gait. Derm - Head, neck, back, abdomen and extremities without suspicious lesions        Assessment & Plan:

## 2012-06-10 NOTE — Patient Instructions (Addendum)
Thanks for coming to see me.  For anxiety - try Zoloft (sertraline) 25 mg once a day.   Your exam was normal. Lab is pending and you will hear either by mail or by MyChart

## 2012-06-14 NOTE — Assessment & Plan Note (Signed)
BP Readings from Last 3 Encounters:  06/10/12 158/82  04/18/12 160/80  04/14/12 152/82   suboptimal control on present regimen.  Plan  Monitor BP at home or drugstore and report results. If 140+ will need to adjust medications.

## 2012-06-14 NOTE — Assessment & Plan Note (Signed)
Due for next scan June '14. Has mild to moderated osteopenia.

## 2012-06-14 NOTE — Assessment & Plan Note (Signed)
Stable off medication.

## 2012-06-14 NOTE — Assessment & Plan Note (Signed)
Her last Hgb was robust. Problem resolved.

## 2012-06-14 NOTE — Assessment & Plan Note (Signed)
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 Currently getting a bit stiffer and is due for follow up visit.

## 2012-06-14 NOTE — Assessment & Plan Note (Signed)
Excellent and protective HDL, LDL just above goal of 130 or less. LDL/HDL about 2 -protective. Framingham cardiac risk: 6% chance of cardiac event in the next 10 years, influenced by elevated BP.  Plan  Continue present regimen.

## 2012-06-14 NOTE — Assessment & Plan Note (Signed)
Doing well. Has some mild residual hoarseness.

## 2012-06-14 NOTE — Assessment & Plan Note (Signed)
Interval history significant for resection of amyloid mass on vocal chords by Dr. Ezzard Standing. A benign condition. Otherwise she has been healthy. Physical exam, sans brest/pelvic is normal. Lab results are reviewed and are in normal range. She is current with colorectal cancer and breast cancer screening. Immunizations - due for pneumonia and shingles vaccine.  In summary - a delightful woman who is doing well at this time. She will return in 1 year or sooner as needed.

## 2012-06-17 ENCOUNTER — Encounter: Payer: Medicare Other | Admitting: Internal Medicine

## 2012-06-18 DIAGNOSIS — G243 Spasmodic torticollis: Secondary | ICD-10-CM | POA: Diagnosis not present

## 2012-07-21 ENCOUNTER — Telehealth: Payer: Self-pay | Admitting: *Deleted

## 2012-07-21 ENCOUNTER — Encounter: Payer: Self-pay | Admitting: Internal Medicine

## 2012-07-21 NOTE — Telephone Encounter (Signed)
Returned call to patient call back #. Left message of statement per Dr. Luanne Bras cholesterol is controlled and liver functions are normal.. Patient to call back if would like copy sent to her MY Chart.

## 2012-07-21 NOTE — Telephone Encounter (Signed)
PATIENT CALLED AND LEFT MESSAGE CONCERNING HER LAB RESULTS FOR HER LIVER FUNCTION  TEST. STATES SHE HAS NOT HEARD FROM THE DOCTOR CONCERNING THIS. IS THERE ANY REASON FOR CONCERN WITH THESE RESULTS. PLEASE ADVISE. PATIENT CALL BACK #[phone].

## 2012-07-21 NOTE — Telephone Encounter (Signed)
Copy of CPX mailed Dec 16th. Labs were no printed but problem list does state cholesterol is controlled. Liver functions were normal - copy may be sent via MyChart ( I don't know how to do this but maybe someone can teach [place])

## 2012-07-22 ENCOUNTER — Ambulatory Visit: Payer: Medicare Other

## 2012-07-28 ENCOUNTER — Ambulatory Visit (INDEPENDENT_AMBULATORY_CARE_PROVIDER_SITE_OTHER): Payer: Medicare Other | Admitting: *Deleted

## 2012-07-28 DIAGNOSIS — Z23 Encounter for immunization: Secondary | ICD-10-CM | POA: Diagnosis not present

## 2012-07-29 DIAGNOSIS — H26499 Other secondary cataract, unspecified eye: Secondary | ICD-10-CM | POA: Diagnosis not present

## 2012-08-03 ENCOUNTER — Other Ambulatory Visit: Payer: Self-pay | Admitting: Gastroenterology

## 2012-08-03 NOTE — Telephone Encounter (Signed)
NEED OFFICE VISIT FOR FURTHER REFILLS 

## 2012-10-22 DIAGNOSIS — G243 Spasmodic torticollis: Secondary | ICD-10-CM | POA: Diagnosis not present

## 2012-11-03 DIAGNOSIS — R490 Dysphonia: Secondary | ICD-10-CM | POA: Diagnosis not present

## 2012-11-13 ENCOUNTER — Other Ambulatory Visit: Payer: Medicare Other

## 2012-11-13 ENCOUNTER — Encounter: Payer: Self-pay | Admitting: Internal Medicine

## 2012-11-13 ENCOUNTER — Ambulatory Visit (INDEPENDENT_AMBULATORY_CARE_PROVIDER_SITE_OTHER): Payer: Medicare Other | Admitting: Internal Medicine

## 2012-11-13 VITALS — BP 148/80 | HR 85 | Temp 99.5°F | Wt 188.0 lb

## 2012-11-13 DIAGNOSIS — N390 Urinary tract infection, site not specified: Secondary | ICD-10-CM

## 2012-11-13 DIAGNOSIS — N200 Calculus of kidney: Secondary | ICD-10-CM | POA: Diagnosis not present

## 2012-11-13 LAB — POCT URINALYSIS DIPSTICK
Ketones, UA: NEGATIVE
Leukocytes, UA: NEGATIVE
Protein, UA: NEGATIVE

## 2012-11-13 MED ORDER — NITROFURANTOIN MONOHYD MACRO 100 MG PO CAPS: 100.0000 mg | ORAL_CAPSULE | Freq: Two times a day (BID) | ORAL | Status: DC

## 2012-11-13 NOTE — Progress Notes (Signed)
Subjective:    Patient ID: Katie Stout, female    DOB: 06-21-1946, 67 y.o.   MRN: [id number]  HPI  Patient of Dr. Debby Bud presents to the office today with CC of abdominal pain, urinary frequency, and urgency x 1 day.  Patient noticed symptoms yesterday afternoon and started increasing her fluid intake.  She feels that this has not helped.  Patient states a history of kidney stones and UTIs in the past.  She was followed by Dr. Isabel Caprice, but has not seen him in over 7 years.  She denies fever, chills, nausea, vomiting, hematuria, dysuria, flank pain, and low back pain.  She states that she would like to go back to see Dr. Isabel Caprice and would like a referral today.   Past Medical History  Diagnosis Date  . Diverticulosis of colon (without mention of hemorrhage)   . GERD (gastroesophageal reflux disease)   . Other and unspecified hyperlipidemia   . Post-nasal drip 04/10/2012  . Hypertension     under control, has been on med. x 7 yrs.  . 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 Palpitations     "all my life" - states no problems; states had stress test 2006; no cardiologist  . Complication of anesthesia     states had flashing lights x 6 wks. post-op in 1992  . Dental crowns present   . History of kidney stones   . Anxiety   . Difficulty swallowing solids     states difficulty swallowing dry foods  . Rash     breasts  . Eczema   . 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 Hiatal hernia     states "small"   Family History  Problem Relation Age of Onset  . Coronary artery disease Mother   . Heart attack Mother   . Breast cancer Mother   . Colon cancer Mother   . Hypertension Father   . Thrombocytopenia Father   . Other Father     ITP  . Prostate cancer Brother        Review of Systems  Constitutional: Negative for fever, chills and fatigue.  Gastrointestinal: Positive for abdominal pain. Negative for nausea and  vomiting.  Genitourinary: Positive for urgency and frequency. Negative for dysuria, hematuria, vaginal bleeding, vaginal discharge and difficulty urinating.  All other systems reviewed and are negative.       Objective:   Physical Exam  Nursing note and vitals reviewed. Constitutional: She is oriented to person, place, and time. She appears well-developed and well-nourished. No distress.  HENT:  Head: Normocephalic and atraumatic.  Eyes: Conjunctivae are normal. No scleral icterus.  Neck: Normal range of motion. Neck supple.  Cardiovascular: Normal rate, regular rhythm and normal heart sounds.  Exam reveals no gallop and no friction rub.   No murmur heard. Pulmonary/Chest: Effort normal and breath sounds normal. No respiratory distress. She has no wheezes. She has no rales. She exhibits no tenderness.  Abdominal: Soft. Bowel sounds are normal. She exhibits no distension and no mass. There is tenderness in the suprapubic area. There is no rebound, no guarding and no CVA tenderness.  Lymphadenopathy:    She has no cervical adenopathy.  Neurological: She is alert and oriented to person, place, and time.  Skin: Skin is warm and dry. She is not diaphoretic.  Psychiatric: She has a normal mood and affect. Her behavior is normal.  Assessment & Plan:   Patient presents to the office today for suprapubic abdominal pain, urinary urgency, and frequency.  Patient's past medical history was reviewed.  Patient's history and physical exam is consistent with cystitis despite negative dipstick in office today.  We will start empiric Macrobid 100 mg PO BID x 5 days.  Patient instructed to drink non caffeine, non-carbinated beverages.    Patient will be referred to urology to see Dr. Isabel Caprice.     I have personally reviewed this case with PA student. I also personally examined this patient. I agree with history and findings as documented above. I reviewed, discussed and approve of the  assessment and plan as listed above. Rene Paci, MD

## 2012-11-13 NOTE — Patient Instructions (Signed)
It was good to see you today. Test(s) ordered today. Your results will be released to MyChart (or called to you) after review, usually within 72hours after test completion. If any changes need to be made, you will be notified at that same time. Until culture results are back, start Macrobid antibiotics 2x/day for 7 days - Your prescription(s) have been submitted to your pharmacy. Please take as directed and contact our office if you believe you are having problem(s) with the medication(s). we'll make referral to Dr Isabel Caprice for follow up. Our office will contact you regarding appointment(s) once made.

## 2012-11-13 NOTE — Progress Notes (Signed)
  Subjective:    Patient ID: Katie Stout, female    DOB: 1945-11-23, 67 y.o.   MRN: [id number]  HPI  See cc and PA student note  Past Medical History  Diagnosis Date  . Diverticulosis of colon (without mention of hemorrhage)   . GERD (gastroesophageal reflux disease)   . Other and unspecified hyperlipidemia   . Post-nasal drip 04/10/2012  . Hypertension     under control, has been on med. x 7 yrs.  . 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 Palpitations     "all my life" - states no problems; states had stress test 2006; no cardiologist  . Complication of anesthesia     states had flashing lights x 6 wks. post-op in 1992  . Dental crowns present   . History of kidney stones   . Anxiety   . Difficulty swallowing solids     states difficulty swallowing dry foods  . Rash     breasts  . Eczema   . 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 Hiatal hernia     states "small"    Review of Systems     Objective:   Physical Exam BP 148/80  Pulse 85  Temp(Src) 99.5 F (37.5 C) (Oral)  Wt 188 lb (85.276 kg)  BMI 31.78 kg/m2  SpO2 95% Wt Readings from Last 3 Encounters:  11/13/12 188 lb (85.276 kg)  06/10/12 187 lb 8 oz (85.049 kg)  04/18/12 187 lb (84.823 kg)   Constitutional: She appears well-developed and well-nourished. No distress. nontoxic Neck: Normal range of motion. Neck supple. No JVD present. No thyromegaly present.  Cardiovascular: Normal rate, regular rhythm and normal heart sounds.  No murmur heard. No BLE edema. Pulmonary/Chest: Effort normal and breath sounds normal. No respiratory distress. She has no wheezes.  Abdominal: mild suprapubic tenderness. Soft. Bowel sounds are normal. She exhibits no distension. There is no CVA tenderness. no masses Skin: Skin is warm and dry. No rash noted. No erythema.  Psychiatric: She has a normal mood and affect. Her behavior is normal. Judgment and thought  content normal.   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

## 2012-11-15 LAB — URINE CULTURE: Colony Count: NO GROWTH

## 2012-11-16 ENCOUNTER — Telehealth: Payer: Self-pay

## 2012-11-16 NOTE — Telephone Encounter (Signed)
Pt called stating that she received her Ucx result on MyChart and since it showed no bacteria, should she continue ABX, please advise.

## 2012-11-16 NOTE — Telephone Encounter (Signed)
I would advise completing antibiotics as begun for symptoms  followup with urology as planned

## 2012-11-16 NOTE — Telephone Encounter (Signed)
Pt advised and will complete ABX course as prescribed.

## 2012-11-17 DIAGNOSIS — R928 Other abnormal and inconclusive findings on diagnostic imaging of breast: Secondary | ICD-10-CM | POA: Diagnosis not present

## 2012-11-17 DIAGNOSIS — Z09 Encounter for follow-up examination after completed treatment for conditions other than malignant neoplasm: Secondary | ICD-10-CM | POA: Diagnosis not present

## 2012-11-20 ENCOUNTER — Other Ambulatory Visit: Payer: Self-pay | Admitting: Internal Medicine

## 2012-11-28 ENCOUNTER — Other Ambulatory Visit: Payer: Self-pay | Admitting: Gastroenterology

## 2012-12-07 DIAGNOSIS — N200 Calculus of kidney: Secondary | ICD-10-CM | POA: Diagnosis not present

## 2012-12-07 DIAGNOSIS — R310 Gross hematuria: Secondary | ICD-10-CM | POA: Diagnosis not present

## 2012-12-14 ENCOUNTER — Ambulatory Visit: Payer: Medicare Other | Admitting: Internal Medicine

## 2012-12-14 DIAGNOSIS — N200 Calculus of kidney: Secondary | ICD-10-CM | POA: Diagnosis not present

## 2012-12-14 DIAGNOSIS — R310 Gross hematuria: Secondary | ICD-10-CM | POA: Diagnosis not present

## 2012-12-14 DIAGNOSIS — K8020 Calculus of gallbladder without cholecystitis without obstruction: Secondary | ICD-10-CM | POA: Diagnosis not present

## 2012-12-14 DIAGNOSIS — N281 Cyst of kidney, acquired: Secondary | ICD-10-CM | POA: Diagnosis not present

## 2012-12-30 ENCOUNTER — Other Ambulatory Visit: Payer: Self-pay | Admitting: Internal Medicine

## 2013-01-11 DIAGNOSIS — N200 Calculus of kidney: Secondary | ICD-10-CM | POA: Diagnosis not present

## 2013-01-11 DIAGNOSIS — R310 Gross hematuria: Secondary | ICD-10-CM | POA: Diagnosis not present

## 2013-01-13 ENCOUNTER — Encounter: Payer: Self-pay | Admitting: *Deleted

## 2013-01-19 ENCOUNTER — Encounter: Payer: Self-pay | Admitting: Gastroenterology

## 2013-01-19 ENCOUNTER — Ambulatory Visit (INDEPENDENT_AMBULATORY_CARE_PROVIDER_SITE_OTHER): Payer: Medicare Other | Admitting: Gastroenterology

## 2013-01-19 VITALS — BP 140/76 | HR 105 | Ht 64.5 in | Wt 188.4 lb

## 2013-01-19 DIAGNOSIS — K219 Gastro-esophageal reflux disease without esophagitis: Secondary | ICD-10-CM

## 2013-01-19 DIAGNOSIS — Z800 Family history of malignant neoplasm of digestive organs: Secondary | ICD-10-CM

## 2013-01-19 DIAGNOSIS — K5730 Diverticulosis of large intestine without perforation or abscess without bleeding: Secondary | ICD-10-CM

## 2013-01-19 DIAGNOSIS — Z8719 Personal history of other diseases of the digestive system: Secondary | ICD-10-CM | POA: Diagnosis not present

## 2013-01-19 DIAGNOSIS — E859 Amyloidosis, unspecified: Secondary | ICD-10-CM | POA: Diagnosis not present

## 2013-01-19 MED ORDER — OMEPRAZOLE 20 MG PO CPDR: 20.0000 mg | DELAYED_RELEASE_CAPSULE | Freq: Every day | ORAL | Status: DC

## 2013-01-19 MED ORDER — NA SULFATE-K SULFATE-MG SULF 17.5-3.13-1.6 GM/177ML PO SOLN: ORAL | Status: DC

## 2013-01-19 MED ORDER — HYOSCYAMINE SULFATE 0.125 MG SL SUBL: 0.1250 mg | SUBLINGUAL_TABLET | SUBLINGUAL | Status: DC | PRN

## 2013-01-19 NOTE — Progress Notes (Signed)
This is a 67 year old Caucasian female with chronic GERD managed with daily Prilosec.  She continues however to have episodes of esophageal spasm relieved by antacids and sublingual Levsin.  She has these episodes of substernal chest pain without real alleviating or precipitating elements otherwise.  She has not had endoscopy in over 10 years.  She also has IBS with gas and bloating, and seems to be doing well at this time without melena or hematochezia.  Her history is been complicated by localized laryngeal amyloidosis with a negative workup for systemic amyloid.  She continues with postnasal drip and drainage, and apparently had limited esophagoscopy by Dr. Ovidio Kin.  Review of her labs shows no abnormal liver function test were kidney involvement.  Last colonoscopy was over 5 years ago, she has a history of colon cancer mother.  Current Medications, Allergies, Past Medical History, Past Surgical History, Family History and Social History were reviewed in [employer] record.  ROS: All systems were reviewed and are negative unless otherwise stated in the HPI.          Physical Exam: Healthy appearing patient in no distress.  Blood pressure 140/76, pulse 105 and weight 188 the BMI of 31.85.  I cannot appreciate stigmata of chronic liver disease.  Her chest is clear and she is in a regular rhythm without murmurs gallops or rubs but does have an occasional ectopic beat noted.  I cannot appreciate hepatosplenomegaly, abdominal masses or tenderness.  Bowel sounds are normal.  Peripheral extremities are unremarkable, and mental status is normal.    Assessment and Plan: There's been no evidence in this patient systemic amyloidosis.  Her acid reflux seems to be doing fairly well, but she continues with esophageal spasm, and I've set her up for endoscopy which was last done over 10 years ago.  If this is unremarkable, she will need high-resolution esophageal manometry for better  definition of her esophageal spasm episodes.  Also because of her family history we will repeat her colonoscopy exam with propofol sedation. She is to continue other medications as listed and reviewed. No diagnosis found.

## 2013-01-19 NOTE — Addendum Note (Signed)
Addended by: Ok Anis A on: 01/19/2013 02:44 PM   Modules accepted: Orders

## 2013-01-19 NOTE — Patient Instructions (Addendum)
You have been scheduled for an endoscopy and colonoscopy with propofol. Please follow the written instructions given to you at your visit today. Please pick up your prep at the pharmacy within the next 1-3 days. If you use inhalers (even only as needed), please bring them with you on the day of your procedure. Your physician has requested that you go to www.startemmi.com and enter the access code given to you at your visit today. This web site gives a general overview about your procedure. However, you should still follow specific instructions given to you by our office regarding your preparation for the procedure.  We have sent the following medications to your pharmacy for you to pick up at your convenience:  Levsin, please take as directed  New prescription for Prilosec(Omeprazole) was sent to your pharmacy

## 2013-01-25 ENCOUNTER — Telehealth: Payer: Self-pay

## 2013-01-25 DIAGNOSIS — H40019 Open angle with borderline findings, low risk, unspecified eye: Secondary | ICD-10-CM | POA: Diagnosis not present

## 2013-01-25 NOTE — Telephone Encounter (Signed)
OK. Delete sertraline from her med list. Add Alprazolam 0.25 mg 1 q 6 prn #30 2 refills. Thanks

## 2013-01-25 NOTE — Telephone Encounter (Signed)
Phone call from patient stating she saw you in December and was given a prescription for Sertraline for stress. She states she only took 2 pills and it made her feel cranky. She would like a prescription for low dose xanax. [facility] [address] [facility]. Please advise. Thanks.

## 2013-01-26 ENCOUNTER — Encounter: Payer: Self-pay | Admitting: Internal Medicine

## 2013-01-26 MED ORDER — ALPRAZOLAM 0.25 MG PO TABS
0.2500 mg | ORAL_TABLET | Freq: Four times a day (QID) | ORAL | Status: DC | PRN
Start: 2013-01-26 — End: 2013-08-30

## 2013-01-26 NOTE — Telephone Encounter (Signed)
Left patient a message that a [facility] was [facility] to [facility]

## 2013-02-01 ENCOUNTER — Telehealth: Payer: Self-pay | Admitting: Gastroenterology

## 2013-02-01 ENCOUNTER — Ambulatory Visit (INDEPENDENT_AMBULATORY_CARE_PROVIDER_SITE_OTHER): Payer: Medicare Other | Admitting: Internal Medicine

## 2013-02-01 ENCOUNTER — Encounter: Payer: Self-pay | Admitting: Internal Medicine

## 2013-02-01 VITALS — BP 148/52 | HR 83 | Temp 98.1°F | Wt 185.0 lb

## 2013-02-01 DIAGNOSIS — J069 Acute upper respiratory infection, unspecified: Secondary | ICD-10-CM

## 2013-02-01 NOTE — Telephone Encounter (Signed)
no

## 2013-02-01 NOTE — Telephone Encounter (Signed)
I spoke with the patient, and she is very congested and states that she has a very sore throat.  She has no fever, but she will go to see Dr. Arthur Holms today.  She stated that she will call me later on today if she needs to cancel.   She wanted to see Dr. Arthur Holms for clearance for her double tomorrow.

## 2013-02-01 NOTE — Patient Instructions (Signed)
Viral Syndrome  You or your child has Viral Syndrome. It is the most common infection causing "colds" and infections in the nose, throat, sinuses, and breathing tubes. Sometimes the infection causes nausea, vomiting, or diarrhea. The germ that causes the infection is a virus. No antibiotic or other medicine will kill it. There are medicines that you can take to make you or your child more comfortable.   HOME CARE INSTRUCTIONS    Rest in bed until you start to feel better.   If you have diarrhea or vomiting, eat small amounts of crackers and toast. Soup is helpful.   Do not give aspirin or medicine that contains aspirin to children.   Only take over-the-counter or prescription medicines for pain, discomfort, or fever as directed by your caregiver.  SEEK IMMEDIATE MEDICAL CARE IF:    You or your child has not improved within one week.   You or your child has pain that is not at least partially relieved by over-the-counter medicine.   Thick, colored mucus or blood is coughed up.   Discharge from the nose becomes thick yellow or green.   Diarrhea or vomiting gets worse.   There is any major change in your or your child's condition.   You or your child develops a skin rash, stiff neck, severe headache, or are unable to hold down food or fluid.   You or your child has an oral temperature above 102 F (38.9 C), not controlled by medicine.   Your baby is older than 3 months with a rectal temperature of 102 F (38.9 C) or higher.   Your baby is 3 months old or younger with a rectal temperature of 100.4 F (38 C) or higher.  Document Released: 06/09/2006 Document Revised: 09/16/2011 Document Reviewed: 06/10/2007  ExitCare Patient Information 2014 [employer].

## 2013-02-01 NOTE — Progress Notes (Signed)
HPI  Pt presents to the clinic today with c/o dry cough, sore throat and dizziness x 1 day. She feels like it may be sinus related. She is having a post nasal drip. She does have a history of allergies. She has not taking anything OTC for this. She is concerned because she is having a colonoscopy tomorrow.   She denies fever, chills or body aches.  Review of Systems      Past Medical History  Diagnosis Date  . Diverticulosis of colon (without mention of hemorrhage)   . GERD (gastroesophageal reflux disease)   . Other and unspecified hyperlipidemia   . Post-nasal drip 04/10/2012  . Hypertension     under control, has been on med. x 7 yrs.  . 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 Palpitations     "all my life" - states no problems; states had stress test 2006; no cardiologist  . Complication of anesthesia     states had flashing lights x 6 wks. post-op in 1992  . Dental crowns present   . History of kidney stones   . Anxiety   . Difficulty swallowing solids     states difficulty swallowing dry foods  . Rash     breasts  . Eczema   . 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 Hiatal hernia     states "small"  . H. pylori infection     Family History  Problem Relation Age of Onset  . Coronary artery disease Mother   . Heart attack Mother   . Breast cancer Mother   . Colon cancer Mother   . Hypertension Father   . Thrombocytopenia Father   . Other Father     ITP  . Prostate cancer Brother     History   Social History  . Marital Status: Widowed    Spouse Name: N/A    Number of Children: 1  . Years of Education: N/A   Occupational History  . Cashier    Social History Main Topics  . Smoking status: Never Smoker   . Smokeless tobacco: Never Used  . Alcohol Use: No  . Drug Use: No  . Sexually Active: Not on file   Other Topics Concern  . Not on file   Social History Narrative   HSG   Married  '68-22 years; Divorced   1 Daughter - '85; no grandchildren   Work: PT for [employer]   Lives: Alone, but adjoins daughter    Allergies  Allergen Reactions  . Penicillins Hives  . Adhesive (Tape) Other (See Comments)    SKIN REDNESS AND IRRITATION  . Codeine Nausea Only     Constitutional: Positive malaise. Denies headache, fatigue, fever or abrupt weight changes.  HEENT:  Positive sore throat. Denies eye redness, eye pain, pressure behind the eyes, facial pain, nasal congestion, ear pain, ringing in the ears, wax buildup, runny nose or bloody nose. Respiratory: Positive cough. Denies difficulty breathing or shortness of breath.  Cardiovascular: Denies chest pain, chest tightness, palpitations or swelling in the hands or feet.   No other specific complaints in a complete review of systems (except as listed in HPI above).  Objective:   BP 148/52  Pulse 83  Temp(Src) 98.1 F (36.7 C) (Oral)  Wt 185 lb (83.915 kg)  BMI 31.28 kg/m2  SpO2 94% Wt Readings from Last 3 Encounters:  02/01/13 185 lb (83.915 kg)  01/19/13 188 lb 6.4 oz (85.458 kg)  11/13/12 188 lb (85.276 kg)     General: Appears her stated age, well developed, well nourished in NAD. HEENT: Head: normal shape and size; Eyes: sclera white, no icterus, conjunctiva pink, PERRLA and EOMs intact; Ears: Tm's gray and intact, normal light reflex; Nose: mucosa pink and moist, septum midline; Throat/Mouth: + PND. Teeth present, mucosa erythematous and moist, no exudate noted, no lesions or ulcerations noted.  Neck: Neck supple, trachea midline. No massses, lumps or thyromegaly present.  Cardiovascular: Normal rate and rhythm. S1,S2 noted.  No murmur, rubs or gallops noted. No JVD or BLE edema. No carotid bruits noted. Pulmonary/Chest: Normal effort and positive vesicular breath sounds. No respiratory distress. No wheezes, rales or ronchi noted.      Assessment & Plan:   Upper Respiratory Infection, likely  viral:  Get some rest and drink plenty of water Do salt water gargles for the sore throat Monitor for fevers Confirmed with GI- ok to keep appointment for colonoscopy  RTC as needed or if symptoms persist.

## 2013-02-02 ENCOUNTER — Encounter: Payer: Medicare Other | Admitting: Gastroenterology

## 2013-02-02 ENCOUNTER — Telehealth: Payer: Self-pay | Admitting: Gastroenterology

## 2013-02-02 NOTE — Telephone Encounter (Signed)
Pt had already r/s her procedure to next week; discussed the prep and changed her hours of the 2nd dose of Suprep. Pt will call for problems/questions.

## 2013-02-10 ENCOUNTER — Ambulatory Visit (AMBULATORY_SURGERY_CENTER): Payer: Medicare Other | Admitting: Gastroenterology

## 2013-02-10 ENCOUNTER — Encounter: Payer: Self-pay | Admitting: Gastroenterology

## 2013-02-10 VITALS — BP 130/70 | HR 76 | Temp 98.3°F | Resp 18 | Ht 64.0 in | Wt 185.0 lb

## 2013-02-10 DIAGNOSIS — K219 Gastro-esophageal reflux disease without esophagitis: Secondary | ICD-10-CM

## 2013-02-10 DIAGNOSIS — Z1211 Encounter for screening for malignant neoplasm of colon: Secondary | ICD-10-CM | POA: Diagnosis not present

## 2013-02-10 DIAGNOSIS — I10 Essential (primary) hypertension: Secondary | ICD-10-CM | POA: Diagnosis not present

## 2013-02-10 DIAGNOSIS — E669 Obesity, unspecified: Secondary | ICD-10-CM | POA: Diagnosis not present

## 2013-02-10 DIAGNOSIS — D509 Iron deficiency anemia, unspecified: Secondary | ICD-10-CM

## 2013-02-10 DIAGNOSIS — Z800 Family history of malignant neoplasm of digestive organs: Secondary | ICD-10-CM | POA: Diagnosis not present

## 2013-02-10 DIAGNOSIS — K5730 Diverticulosis of large intestine without perforation or abscess without bleeding: Secondary | ICD-10-CM | POA: Diagnosis not present

## 2013-02-10 HISTORY — PX: COLONOSCOPY: SHX174

## 2013-02-10 HISTORY — PX: ESOPHAGOGASTRODUODENOSCOPY: SHX1529

## 2013-02-10 MED ORDER — SODIUM CHLORIDE 0.9 % IV SOLN: 500.0000 mL | INTRAVENOUS | Status: DC

## 2013-02-10 NOTE — Progress Notes (Signed)
Proceure ends, to recovery awake. Report given. Good airway w/ stable sats in high90s. VSS.

## 2013-02-10 NOTE — Progress Notes (Signed)
Pt coughing with secretions. O2 increased to 6l/m. Sats stable. Suctioned of secretions  During EGD.

## 2013-02-10 NOTE — Op Note (Signed)
[facility] [address] [place], [address]   ENDOSCOPY PROCEDURE REPORT  PATIENT: Katie Stout, Katie Stout  MR#: [id number] BIRTHDATE: Jan 02, 1946 , 66  yrs. old GENDER: Female ENDOSCOPIST:Eleina Jergens Hale Bogus, MD, [facility] REFERRED BY: Jacques Navy, M.D. PROCEDURE DATE:  02/10/2013 PROCEDURE:   EGD, diagnostic ASA CLASS:    Class II INDICATIONS: Chest pain, Dysphagia, and history of GERD. MEDICATION: There was residual sedation effect present from prior procedure and propofol (Diprivan) 100mg  IV TOPICAL ANESTHETIC:  DESCRIPTION OF PROCEDURE:   After the risks and benefits of the procedure were explained, informed consent was obtained.  The LB [id number]  endoscope was introduced through the mouth  and advanced to the    .  The instrument was slowly withdrawn as the mucosa was fully examined.      DUODENUM: The duodenal mucosa showed no abnormalities in the bulb and second portion of the duodenum.  STOMACH: The mucosa of the stomach appeared normal. there were no mucosal polypoid lesions or evidence of gastroparesis.  ESOPHAGUS: A 4 cm hiatal hernia was noted. there was free reflux the patient had very reactive airway disease.  I could see no definite stricture or erosive esophagitis.   Retroflexed views revealed a hiatal hernia.    The scope was then withdrawn from the patient and the procedure completed.  COMPLICATIONS: There were no complications.   ENDOSCOPIC IMPRESSION: 1.   The duodenal mucosa showed no abnormalities in the bulb and second portion of the duodenum 2.   The mucosa of the stomach appeared normal 3.   4 cm hiatal hernia ..chronic GERD and spasm  RECOMMENDATIONS: 1.  Anti-reflux regimen to be follow 2.  Continue current medications 3. Esophageal manometry    _______________________________ eSigned:  Mardella Layman, MD, [facility] 02/10/2013 2:01 PM   standard discharge

## 2013-02-10 NOTE — Op Note (Signed)
[facility] [address] [place], [address]   COLONOSCOPY PROCEDURE REPORT  PATIENT: Katie, Stout  MR#: [id number] BIRTHDATE: 1945/12/25 , 66  yrs. old GENDER: Female ENDOSCOPIST: Mardella Layman, MD, [facility] REFERRED BY: PROCEDURE DATE:  02/10/2013 PROCEDURE:   Colonoscopy, screening First Screening Colonoscopy - Avg.  risk and is 50 yrs.  old or older - No. ASA CLASS:   Class II INDICATIONS:Colorectal cancer screening. MEDICATIONS: Propofol (Diprivan) 180 mg IV  DESCRIPTION OF PROCEDURE:   After the risks benefits and alternatives of the procedure were thoroughly explained, informed consent was obtained.  A digital rectal exam revealed no abnormalities of the rectum.   The LB [id number]  endoscope was introduced through the anus and advanced to the cecum, which was identified by both the appendix and ileocecal valve. No adverse events experienced.   The quality of the prep was excellent, using MoviPrep  The instrument was then slowly withdrawn as the colon was fully examined.      COLON FINDINGS: There was moderate diverticulosis noted in the descending colon and sigmoid colon with associated tortuosity and muscular hypertrophy.   The colon was otherwise normal.  There was no diverticulosis, inflammation, polyps or cancers unless previously stated.  Retroflexed views revealed no abnormalities. The time to cecum=minutes 0 seconds.  Withdrawal time=  .  The scope was withdrawn and the procedure completed. COMPLICATIONS: There were no complications.  ENDOSCOPIC IMPRESSION: 1.   There was moderate diverticulosis noted in the descending colon and sigmoid colon 2.   The colon was otherwise normal...no polyps noted...  RECOMMENDATIONS: 1.  Continue current medications 2.  High fiber diet with liberal fluid intake.   eSigned:  Mardella Layman, MD, [facility] 02/10/2013 1:57 PM   cc: Jacques Navy, MD

## 2013-02-10 NOTE — Progress Notes (Signed)
Patient did not experience any of the following events: a burn prior to discharge; a fall within the facility; wrong site/side/patient/procedure/implant event; or a hospital transfer or hospital admission upon discharge from the facility. (G8907) Patient did not have preoperative order for IV antibiotic SSI prophylaxis. (G8918)  

## 2013-02-10 NOTE — Patient Instructions (Addendum)
YOU HAD AN ENDOSCOPIC PROCEDURE TODAY AT [facility]: Refer to the procedure report that was given to you for any specific questions about what was found during the examination.  If the procedure report does not answer your questions, please call your gastroenterologist to clarify.  If you requested that your care partner not be given the details of your procedure findings, then the procedure report has been included in a sealed envelope for you to review at your convenience later.  YOU SHOULD EXPECT: Some feelings of bloating in the abdomen. Passage of more gas than usual.  Walking can help get rid of the air that was put into your GI tract during the procedure and reduce the bloating. If you had a lower endoscopy (such as a colonoscopy or flexible sigmoidoscopy) you may notice spotting of blood in your stool or on the toilet paper. If you underwent a bowel prep for your procedure, then you may not have a normal bowel movement for a few days.  DIET: Your first meal following the procedure should be a light meal and then it is ok to progress to your normal diet.  A half-sandwich or bowl of soup is an example of a good first meal.  Heavy or fried foods are harder to digest and may make you feel nauseous or bloated.  Likewise meals heavy in dairy and vegetables can cause extra gas to form and this can also increase the bloating.  Drink plenty of fluids but you should avoid alcoholic beverages for 24 hours.  ACTIVITY: Your care partner should take you home directly after the procedure.  You should plan to take it easy, moving slowly for the rest of the day.  You can resume normal activity the day after the procedure however you should NOT DRIVE or use heavy machinery for 24 hours (because of the sedation medicines used during the test).    SYMPTOMS TO REPORT IMMEDIATELY: A gastroenterologist can be reached at any hour.  During normal business hours, 8:30 AM to 5:00 PM Monday through Friday,  call [phone].  After hours and on weekends, please call the GI answering service at [phone] who will take a message and have the physician on call contact you.   Following lower endoscopy (colonoscopy or flexible sigmoidoscopy):  Excessive amounts of blood in the stool  Significant tenderness or worsening of abdominal pains  Swelling of the abdomen that is new, acute  Fever of 100F or higher  Following upper endoscopy (EGD)  Vomiting of blood or coffee ground material  New chest pain or pain under the shoulder blades  Painful or persistently difficult swallowing  New shortness of breath  Fever of 100F or higher  Black, tarry-looking stools  FOLLOW UP: Our staff will call the home number listed on your records the next business day following your procedure to check on you and address any questions or concerns that you may have at that time regarding the information given to you following your procedure. This is a courtesy call and so if there is no answer at the home number and we have not heard from you through the emergency physician on call, we will assume that you have returned to your regular daily activities without incident.  SIGNATURES/CONFIDENTIALITY: You and/or your care partner have signed paperwork which will be entered into your electronic medical record.  These signatures attest to the fact that that the information above on your After Visit Summary has been reviewed and is  understood.  Full responsibility of the confidentiality of this discharge information lies with you and/or your care-partner.  Please continue your normal medications  Please read over information about diverticulosis, high fiber diets, and GERD/Hiatal Hernia   Dr. Norval Gable office nurse will call you to set up an esophageal manometry

## 2013-02-11 ENCOUNTER — Telehealth: Payer: Self-pay | Admitting: *Deleted

## 2013-02-11 NOTE — Telephone Encounter (Signed)
Phoned pt to inform her I am scheduling the Esophageal Mano test for her; informed her test is only done on Mondays at [facility], etc. Pt states she's had this problem since she was in her 30's and she would rather discuss the procedure 1st; she does not want to have surgery. Pt scheduled to seee Dr Jarold Motto on 02/23/13.

## 2013-02-11 NOTE — Telephone Encounter (Signed)
Per 02/10/13 EGD, order EM. lmom for pt to call back.

## 2013-02-12 ENCOUNTER — Telehealth: Payer: Self-pay | Admitting: *Deleted

## 2013-02-12 NOTE — Telephone Encounter (Signed)
Left message that we called for f/u 

## 2013-02-23 ENCOUNTER — Other Ambulatory Visit: Payer: Self-pay | Admitting: *Deleted

## 2013-02-23 ENCOUNTER — Ambulatory Visit (INDEPENDENT_AMBULATORY_CARE_PROVIDER_SITE_OTHER): Payer: Medicare Other | Admitting: Gastroenterology

## 2013-02-23 ENCOUNTER — Encounter: Payer: Self-pay | Admitting: Gastroenterology

## 2013-02-23 VITALS — BP 134/80 | HR 88 | Ht 64.0 in | Wt 187.2 lb

## 2013-02-23 DIAGNOSIS — R079 Chest pain, unspecified: Secondary | ICD-10-CM

## 2013-02-23 DIAGNOSIS — K219 Gastro-esophageal reflux disease without esophagitis: Secondary | ICD-10-CM | POA: Diagnosis not present

## 2013-02-23 MED ORDER — OMEPRAZOLE 40 MG PO CPDR: 40.0000 mg | DELAYED_RELEASE_CAPSULE | Freq: Every day | ORAL | Status: DC

## 2013-02-23 MED ORDER — HYOSCYAMINE SULFATE 0.125 MG SL SUBL: 0.1250 mg | SUBLINGUAL_TABLET | SUBLINGUAL | Status: DC | PRN

## 2013-02-23 NOTE — Progress Notes (Signed)
History of Present Illness: This is a 67 year old Caucasian female with chronic GERD.  Recent endoscopy was unremarkable except for a 4 cm hiatal hernia.  Her colonoscopy also was unremarkable.  She continues to complain of spasmodic-type chest pain no true dysphagia.  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  Currently she is on Prilosec 20 mg a day and when necessary Levsin.    Current Medications, Allergies, Past Medical History, Past Surgical History, Family History and Social History were reviewed in [employer] record.  ROS: All systems were reviewed and are negative unless otherwise stated in the HPI.         Assessment and plan: I've schedule esophageal manometry which should clarify a lot of issues in this patient's case.  I have changed her to Dexilant 60 mg every morning in place of Prilosec, we will continue when necessary sublingual Levsin, continue reflux regime, and I've cut nonabsorbable carbohydrates from her diet because of her occasional fecal incontinence.  I doubt she would be a good candidate for fundoplication surgery.  She relates worsening of her recent complaints to heavy lifting, she may have had some element of straining associated with her hiatal hernia.  In any case, she seems to be doing fairly well at this time, and I suspect she will do better with more potent acid suppression

## 2013-02-23 NOTE — Patient Instructions (Addendum)
We have sent the following medications to your pharmacy for you to pick up at your convenience: Omeprazole 40 mg, please take one capsule by mouth once daily Levsin 0.125 mg, please take as directed  Artificial Sweeteners Information given today.  You have been scheduled for an esophageal manometry at [facility] on 03-01-2013 at 9am. Please arrive 30 minutes prior to your procedure for registration. You will need to go to outpatient registration (1st floor of the hospital) first. Make certain to bring your insurance cards as well as a complete list of medications.  Please remember the following:  1) Nothing to eat or drink after 12:00 midnight on the night before your test.  2) Hold all diabetic medications/insulin the morning of the test. You may eat and take your medications after the test.  3) For 3 days prior to your test do not take: Dexilant, Prevacid, Nexium, Protonix, Aciphex, Zegerid, Pantoprazole, Prilosec or omeprazole.  4) For 2 days prior to your test, do not take: Reglan, Tagamet, Zantac, Axid or Pepcid.  5) You MAY use an antacid such as Rolaids or Tums up to 12 hours prior to your test.  It will take at least 2 weeks to receive the results of this test from your physician. ------------------------------------------ ABOUT ESOPHAGEAL MANOMETRY Esophageal manometry (muh-NOM-uh-tree) is a test that gauges how well your esophagus works. Your esophagus is the long, muscular tube that connects your throat to your stomach. Esophageal manometry measures the rhythmic muscle contractions (peristalsis) that occur in your esophagus when you swallow. Esophageal manometry also measures the coordination and force exerted by the muscles of your esophagus.  During esophageal manometry, a thin, flexible tube (catheter) that contains sensors is passed through your nose, down your esophagus and into your stomach. Esophageal manometry can be helpful in diagnosing some mostly uncommon  disorders that affect your esophagus.  Why it's done Esophageal manometry is used to evaluate the movement (motility) of food through the esophagus and into the stomach. The test measures how well the circular bands of muscle (sphincters) at the top and bottom of your esophagus open and close, as well as the pressure, strength and pattern of the wave of esophageal muscle contractions that moves food along.  What you can expect Esophageal manometry is an outpatient procedure done without sedation. Most people tolerate it well. You may be asked to change into a hospital gown before the test starts.  During esophageal manometry  While you are sitting up, a member of your health care team sprays your throat with a numbing medication or puts numbing gel in your nose or both.  A catheter is guided through your nose into your esophagus. The catheter may be sheathed in a water-filled sleeve. It doesn't interfere with your breathing. However, your eyes may water, and you may gag. You may have a slight nosebleed from irritation.  After the catheter is in place, you may be asked to lie on your back on an exam table, or you may be asked to remain seated.  You then swallow small sips of water. As you do, a computer connected to the catheter records the pressure, strength and pattern of your esophageal muscle contractions.  During the test, you'll be asked to breathe slowly and smoothly, remain as still as possible, and swallow only when you're asked to do so.  A member of your health care team may move the catheter down into your stomach while the catheter continues its measurements.  The catheter then is  slowly withdrawn. The test usually lasts 20 to 30 minutes.  After esophageal manometry  When your esophageal manometry is complete, you may return to your normal activities  This test typically takes 30-45 minutes to complete. ________________________________________________________________________________

## 2013-03-02 ENCOUNTER — Other Ambulatory Visit: Payer: Self-pay | Admitting: Internal Medicine

## 2013-03-16 ENCOUNTER — Ambulatory Visit (INDEPENDENT_AMBULATORY_CARE_PROVIDER_SITE_OTHER): Payer: Medicare Other

## 2013-03-16 DIAGNOSIS — Z23 Encounter for immunization: Secondary | ICD-10-CM | POA: Diagnosis not present

## 2013-03-22 ENCOUNTER — Encounter (HOSPITAL_COMMUNITY): Admission: RE | Payer: Self-pay | Source: Ambulatory Visit

## 2013-03-22 ENCOUNTER — Ambulatory Visit (HOSPITAL_COMMUNITY): Admission: RE | Admit: 2013-03-22 | Payer: Medicare Other | Source: Ambulatory Visit | Admitting: Gastroenterology

## 2013-03-22 SURGERY — MANOMETRY, ESOPHAGUS
Anesthesia: Topical

## 2013-04-06 ENCOUNTER — Ambulatory Visit (INDEPENDENT_AMBULATORY_CARE_PROVIDER_SITE_OTHER): Payer: Medicare Other

## 2013-04-06 DIAGNOSIS — Z2911 Encounter for prophylactic immunotherapy for respiratory syncytial virus (RSV): Secondary | ICD-10-CM

## 2013-04-06 DIAGNOSIS — Z23 Encounter for immunization: Secondary | ICD-10-CM | POA: Diagnosis not present

## 2013-04-22 DIAGNOSIS — G243 Spasmodic torticollis: Secondary | ICD-10-CM | POA: Diagnosis not present

## 2013-05-25 DIAGNOSIS — Z803 Family history of malignant neoplasm of breast: Secondary | ICD-10-CM | POA: Diagnosis not present

## 2013-05-25 DIAGNOSIS — R928 Other abnormal and inconclusive findings on diagnostic imaging of breast: Secondary | ICD-10-CM | POA: Diagnosis not present

## 2013-06-16 ENCOUNTER — Encounter: Payer: Self-pay | Admitting: Internal Medicine

## 2013-07-28 ENCOUNTER — Encounter: Payer: Self-pay | Admitting: *Deleted

## 2013-08-02 DIAGNOSIS — H26499 Other secondary cataract, unspecified eye: Secondary | ICD-10-CM | POA: Diagnosis not present

## 2013-08-02 DIAGNOSIS — H40019 Open angle with borderline findings, low risk, unspecified eye: Secondary | ICD-10-CM | POA: Diagnosis not present

## 2013-08-03 ENCOUNTER — Ambulatory Visit (INDEPENDENT_AMBULATORY_CARE_PROVIDER_SITE_OTHER): Payer: Medicare Other | Admitting: Gastroenterology

## 2013-08-03 ENCOUNTER — Encounter: Payer: Self-pay | Admitting: Gastroenterology

## 2013-08-03 VITALS — BP 162/84 | HR 84 | Ht 64.0 in | Wt 187.2 lb

## 2013-08-03 DIAGNOSIS — K219 Gastro-esophageal reflux disease without esophagitis: Secondary | ICD-10-CM

## 2013-08-03 DIAGNOSIS — K224 Dyskinesia of esophagus: Secondary | ICD-10-CM | POA: Diagnosis not present

## 2013-08-03 MED ORDER — HYOSCYAMINE SULFATE 0.125 MG SL SUBL: 0.1250 mg | SUBLINGUAL_TABLET | SUBLINGUAL | Status: DC | PRN

## 2013-08-03 NOTE — Patient Instructions (Addendum)
Please follow up with Dr. Hilarie Fredrickson in three months(Please call back to schedule)  New prescription for Levsin was sent to your pharmacy

## 2013-08-03 NOTE — Progress Notes (Signed)
This is a very nice 68 year old Caucasian female with chronic GERD and secondary esophageal spasm.  She currently is asymptomatic on 40 mg of Nexium a day and when necessary sublingual Levsin.  Esophageal manometry was scheduled but never completed.  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  She currently denies dysphagia, and has mild constipation but is up-to-date on her colonoscopy and endoscopy.  She denies lower GI or hepatobiliary complaints.  Her appetite is good her weight is stable.  Current Medications, Allergies, Past Medical History, Past Surgical History, Family History and Social History were reviewed in [employer] record.  ROS: All systems were reviewed and are negative unless otherwise stated in the HPI.          Physical Exam: Blood pressure 162/84, pulse 84 and regular and weight 187.  Neck exam is generally unremarkable.  Chest is generally clear there are no murmurs gallops or rubs.  Her abdomen shows no organomegaly, masses, localized tenderness.  Bowel sounds are normal.  Mental status is normal.    Assessment and Plan: Long history of acid reflux and secondary esophageal spasm well-controlled current medications.  Have reviewed a reflux regime with this patient and she is to continue her current medications and followup with Dr. Hilarie Fredrickson in several months and to discuss possible esophageal manometry although I'm not sure that is indicated at this time.  She otherwise is to continue her medications as listed and reviewed.

## 2013-08-16 DIAGNOSIS — H9319 Tinnitus, unspecified ear: Secondary | ICD-10-CM | POA: Diagnosis not present

## 2013-08-16 DIAGNOSIS — R490 Dysphonia: Secondary | ICD-10-CM | POA: Diagnosis not present

## 2013-08-16 DIAGNOSIS — H905 Unspecified sensorineural hearing loss: Secondary | ICD-10-CM | POA: Diagnosis not present

## 2013-08-16 DIAGNOSIS — H903 Sensorineural hearing loss, bilateral: Secondary | ICD-10-CM | POA: Diagnosis not present

## 2013-08-30 ENCOUNTER — Other Ambulatory Visit: Payer: Self-pay | Admitting: Internal Medicine

## 2013-12-09 DIAGNOSIS — G243 Spasmodic torticollis: Secondary | ICD-10-CM | POA: Diagnosis not present

## 2013-12-09 DIAGNOSIS — G5610 Other lesions of median nerve, unspecified upper limb: Secondary | ICD-10-CM | POA: Diagnosis not present

## 2014-01-18 ENCOUNTER — Other Ambulatory Visit: Payer: Self-pay

## 2014-01-18 MED ORDER — SIMVASTATIN 20 MG PO TABS
20.0000 mg | ORAL_TABLET | Freq: Every day | ORAL | Status: DC
Start: 2014-01-18 — End: 2014-02-28

## 2014-01-31 DIAGNOSIS — H40019 Open angle with borderline findings, low risk, unspecified eye: Secondary | ICD-10-CM | POA: Diagnosis not present

## 2014-01-31 DIAGNOSIS — H26499 Other secondary cataract, unspecified eye: Secondary | ICD-10-CM | POA: Diagnosis not present

## 2014-02-02 DIAGNOSIS — M542 Cervicalgia: Secondary | ICD-10-CM | POA: Diagnosis not present

## 2014-02-02 DIAGNOSIS — G44229 Chronic tension-type headache, not intractable: Secondary | ICD-10-CM | POA: Diagnosis not present

## 2014-02-02 DIAGNOSIS — M25519 Pain in unspecified shoulder: Secondary | ICD-10-CM | POA: Diagnosis not present

## 2014-02-02 DIAGNOSIS — R51 Headache: Secondary | ICD-10-CM | POA: Diagnosis not present

## 2014-02-02 DIAGNOSIS — Z859 Personal history of malignant neoplasm, unspecified: Secondary | ICD-10-CM | POA: Diagnosis not present

## 2014-02-15 DIAGNOSIS — J322 Chronic ethmoidal sinusitis: Secondary | ICD-10-CM | POA: Diagnosis not present

## 2014-02-19 ENCOUNTER — Emergency Department (HOSPITAL_BASED_OUTPATIENT_CLINIC_OR_DEPARTMENT_OTHER): Payer: No Typology Code available for payment source

## 2014-02-19 ENCOUNTER — Emergency Department (HOSPITAL_BASED_OUTPATIENT_CLINIC_OR_DEPARTMENT_OTHER)
Admission: EM | Admit: 2014-02-19 | Discharge: 2014-02-19 | Disposition: A | Discharge status: Home / Self Care | Payer: No Typology Code available for payment source | Attending: Emergency Medicine | Admitting: Emergency Medicine

## 2014-02-19 ENCOUNTER — Encounter (HOSPITAL_BASED_OUTPATIENT_CLINIC_OR_DEPARTMENT_OTHER): Payer: Self-pay | Admitting: Emergency Medicine

## 2014-02-19 DIAGNOSIS — M5030 Other cervical disc degeneration, unspecified cervical region: Secondary | ICD-10-CM | POA: Diagnosis not present

## 2014-02-19 DIAGNOSIS — S199XXA Unspecified injury of neck, initial encounter: Principal | ICD-10-CM

## 2014-02-19 DIAGNOSIS — Z87442 Personal history of urinary calculi: Secondary | ICD-10-CM | POA: Diagnosis not present

## 2014-02-19 DIAGNOSIS — M542 Cervicalgia: Secondary | ICD-10-CM

## 2014-02-19 DIAGNOSIS — Z872 Personal history of diseases of the skin and subcutaneous tissue: Secondary | ICD-10-CM | POA: Diagnosis not present

## 2014-02-19 DIAGNOSIS — Z8669 Personal history of other diseases of the nervous system and sense organs: Secondary | ICD-10-CM | POA: Diagnosis not present

## 2014-02-19 DIAGNOSIS — Z8709 Personal history of other diseases of the respiratory system: Secondary | ICD-10-CM | POA: Insufficient documentation

## 2014-02-19 DIAGNOSIS — Y9389 Activity, other specified: Secondary | ICD-10-CM | POA: Insufficient documentation

## 2014-02-19 DIAGNOSIS — Z79899 Other long term (current) drug therapy: Secondary | ICD-10-CM | POA: Diagnosis not present

## 2014-02-19 DIAGNOSIS — S0993XA Unspecified injury of face, initial encounter: Secondary | ICD-10-CM | POA: Diagnosis not present

## 2014-02-19 DIAGNOSIS — F411 Generalized anxiety disorder: Secondary | ICD-10-CM | POA: Insufficient documentation

## 2014-02-19 DIAGNOSIS — K219 Gastro-esophageal reflux disease without esophagitis: Secondary | ICD-10-CM | POA: Insufficient documentation

## 2014-02-19 DIAGNOSIS — M5412 Radiculopathy, cervical region: Secondary | ICD-10-CM | POA: Diagnosis not present

## 2014-02-19 DIAGNOSIS — E785 Hyperlipidemia, unspecified: Secondary | ICD-10-CM | POA: Insufficient documentation

## 2014-02-19 DIAGNOSIS — Y92410 Unspecified street and highway as the place of occurrence of the external cause: Secondary | ICD-10-CM | POA: Insufficient documentation

## 2014-02-19 DIAGNOSIS — Z880 Allergy status to penicillin: Secondary | ICD-10-CM | POA: Diagnosis not present

## 2014-02-19 DIAGNOSIS — I10 Essential (primary) hypertension: Secondary | ICD-10-CM | POA: Diagnosis not present

## 2014-02-19 DIAGNOSIS — T148XXA Other injury of unspecified body region, initial encounter: Secondary | ICD-10-CM | POA: Diagnosis not present

## 2014-02-19 DIAGNOSIS — Z8619 Personal history of other infectious and parasitic diseases: Secondary | ICD-10-CM | POA: Diagnosis not present

## 2014-02-19 DIAGNOSIS — M4802 Spinal stenosis, cervical region: Secondary | ICD-10-CM | POA: Diagnosis not present

## 2014-02-19 DIAGNOSIS — G44229 Chronic tension-type headache, not intractable: Secondary | ICD-10-CM | POA: Diagnosis not present

## 2014-02-19 MED ORDER — OXYCODONE-ACETAMINOPHEN 5-325 MG PO TABS
1.0000 | ORAL_TABLET | Freq: Once | ORAL | Status: AC
  Administered 2014-02-19: 1 via ORAL
  Filled 2014-02-19: qty 1

## 2014-02-19 MED ORDER — OXYCODONE-ACETAMINOPHEN 5-325 MG PO TABS: 1.0000 | ORAL_TABLET | ORAL | Status: DC | PRN
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
## 2014-02-19 NOTE — ED Notes (Signed)
Dr Ray at bedside. 
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
## 2014-02-25 ENCOUNTER — Ambulatory Visit (INDEPENDENT_AMBULATORY_CARE_PROVIDER_SITE_OTHER): Payer: Medicare Other | Admitting: Family Medicine

## 2014-02-25 ENCOUNTER — Encounter: Payer: Self-pay | Admitting: Family Medicine

## 2014-02-25 VITALS — BP 160/80 | HR 95 | Temp 98.0°F | Resp 16 | Ht 64.5 in | Wt 190.0 lb

## 2014-02-25 DIAGNOSIS — I10 Essential (primary) hypertension: Secondary | ICD-10-CM | POA: Diagnosis not present

## 2014-02-25 DIAGNOSIS — R259 Unspecified abnormal involuntary movements: Secondary | ICD-10-CM | POA: Diagnosis not present

## 2014-02-25 DIAGNOSIS — E785 Hyperlipidemia, unspecified: Secondary | ICD-10-CM | POA: Diagnosis not present

## 2014-02-25 DIAGNOSIS — E859 Amyloidosis, unspecified: Secondary | ICD-10-CM | POA: Diagnosis not present

## 2014-02-25 MED ORDER — TIZANIDINE HCL 4 MG PO CAPS: 4.0000 mg | ORAL_CAPSULE | Freq: Three times a day (TID) | ORAL | Status: DC

## 2014-02-25 NOTE — Progress Notes (Signed)
Pre visit review using our clinic review tool, if applicable. No additional management support is needed unless otherwise documented below in the visit note. 

## 2014-02-25 NOTE — Patient Instructions (Signed)
Follow up as scheduled We'll notify you of your lab results and make any changes if needed Take the Zanaflex as needed for spasm- may cause drowsiness Continue Aleve as needed for inflammation- take w/ food Keep up the good work on your exercise!  Great job! Call with any questions or concerns Welcome!  We're glad to have you!

## 2014-02-25 NOTE — Assessment & Plan Note (Signed)
New to provider, ongoing for pt.  Following w/ Neuro at [facility].  Torticollis is ongoing.  Will start muscle relaxer for symptom relief.

## 2014-02-25 NOTE — Assessment & Plan Note (Signed)
Pt w/ vocal cord amyloid mass.  Tested (-) for amyloid in blood and urine.  Following w/ ENT.  Will follow along.

## 2014-02-25 NOTE — Assessment & Plan Note (Signed)
New to provider, ongoing for pt.  Tolerating statin w/o difficulty.  Check labs.  Adjust meds prn  
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
## 2014-02-26 LAB — CBC WITH DIFFERENTIAL/PLATELET
Basophils Absolute: 0 10*3/uL (ref 0.0–0.1)
Basophils Relative: 0.4 % (ref 0.0–3.0)
Eosinophils Absolute: 0.2 10*3/uL (ref 0.0–0.7)
Eosinophils Relative: 3.9 % (ref 0.0–5.0)
HCT: 42.6 % (ref 36.0–46.0)
Hemoglobin: 14.3 g/dL (ref 12.0–15.0)
Lymphocytes Relative: 19.5 % (ref 12.0–46.0)
Lymphs Abs: 1.1 10*3/uL (ref 0.7–4.0)
MCHC: 33.7 g/dL (ref 30.0–36.0)
MCV: 91.4 fl (ref 78.0–100.0)
Monocytes Absolute: 0.4 10*3/uL (ref 0.1–1.0)
Monocytes Relative: 6.7 % (ref 3.0–12.0)
Neutro Abs: 4.1 10*3/uL (ref 1.4–7.7)
Neutrophils Relative %: 69.5 % (ref 43.0–77.0)
Platelets: 214 10*3/uL (ref 150.0–400.0)
RBC: 4.66 Mil/uL (ref 3.87–5.11)
RDW: 14.1 % (ref 11.5–15.5)
WBC: 5.8 10*3/uL (ref 4.0–10.5)

## 2014-02-26 LAB — HEPATIC FUNCTION PANEL
ALBUMIN: 4 g/dL (ref 3.5–5.2)
ALT: 25 U/L (ref 0–35)
AST: 24 U/L (ref 0–37)
Alkaline Phosphatase: 45 U/L (ref 39–117)
Bilirubin, Direct: 0.3 mg/dL (ref 0.0–0.3)
TOTAL PROTEIN: 7 g/dL (ref 6.0–8.3)
Total Bilirubin: 2.3 mg/dL — ABNORMAL HIGH (ref 0.2–1.2)

## 2014-02-26 LAB — BASIC METABOLIC PANEL
BUN: 13 mg/dL (ref 6–23)
CALCIUM: 9.7 mg/dL (ref 8.4–10.5)
CO2: 28 meq/L (ref 19–32)
CREATININE: 0.6 mg/dL (ref 0.4–1.2)
Chloride: 98 mEq/L (ref 96–112)
GFR: 107.49 mL/min (ref >60.00)
GLUCOSE: 70 mg/dL (ref 70–99)
Potassium: 3.1 mEq/L — ABNORMAL LOW (ref 3.5–5.1)
Sodium: 138 mEq/L (ref 135–145)

## 2014-02-26 LAB — LIPID PANEL
CHOLESTEROL: 222 mg/dL — AB (ref 0–200)
HDL: 71.2 mg/dL (ref >39.00)
LDL Cholesterol: 126 mg/dL — ABNORMAL HIGH (ref 0–99)
NonHDL: 150.8
Total CHOL/HDL Ratio: 3
Triglycerides: 123 mg/dL (ref 0.0–149.0)
VLDL: 24.6 mg/dL (ref 0.0–40.0)

## 2014-02-28 ENCOUNTER — Encounter: Payer: Self-pay | Admitting: General Practice

## 2014-02-28 ENCOUNTER — Telehealth: Payer: Self-pay | Admitting: Internal Medicine

## 2014-02-28 ENCOUNTER — Other Ambulatory Visit: Payer: Self-pay | Admitting: Family Medicine

## 2014-02-28 NOTE — Telephone Encounter (Signed)
Med filled.  

## 2014-02-28 NOTE — Telephone Encounter (Signed)
Relevant patient education assigned to patient using Emmi. ° °

## 2014-03-01 DIAGNOSIS — M542 Cervicalgia: Secondary | ICD-10-CM | POA: Diagnosis not present

## 2014-03-02 ENCOUNTER — Telehealth: Payer: Self-pay | Admitting: Family Medicine

## 2014-03-02 DIAGNOSIS — M542 Cervicalgia: Secondary | ICD-10-CM

## 2014-03-02 NOTE — Telephone Encounter (Signed)
Referral placed.

## 2014-03-02 NOTE — Telephone Encounter (Signed)
Caller name: Sapna  Relation to pt: self  Call back number: [phone]    Reason for call:   pt requesting referral for physical therapist for her neck

## 2014-03-02 NOTE — Telephone Encounter (Signed)
Ok for PT referral for neck pain

## 2014-03-07 ENCOUNTER — Telehealth: Payer: Self-pay | Admitting: Family Medicine

## 2014-03-07 NOTE — Telephone Encounter (Signed)
Pt wanted to advise she has an appointment at [facility] orthopedic sport medicine 03/08/14 for physical therapy.

## 2014-03-08 DIAGNOSIS — M542 Cervicalgia: Secondary | ICD-10-CM | POA: Diagnosis not present

## 2014-03-10 DIAGNOSIS — M542 Cervicalgia: Secondary | ICD-10-CM | POA: Diagnosis not present

## 2014-03-15 DIAGNOSIS — M542 Cervicalgia: Secondary | ICD-10-CM | POA: Diagnosis not present

## 2014-03-16 DIAGNOSIS — R6889 Other general symptoms and signs: Secondary | ICD-10-CM | POA: Diagnosis not present

## 2014-03-18 DIAGNOSIS — M542 Cervicalgia: Secondary | ICD-10-CM | POA: Diagnosis not present

## 2014-03-21 DIAGNOSIS — M542 Cervicalgia: Secondary | ICD-10-CM | POA: Diagnosis not present

## 2014-03-22 ENCOUNTER — Ambulatory Visit: Payer: Medicare Other

## 2014-03-22 DIAGNOSIS — Z23 Encounter for immunization: Secondary | ICD-10-CM

## 2014-03-24 DIAGNOSIS — M542 Cervicalgia: Secondary | ICD-10-CM | POA: Diagnosis not present

## 2014-03-28 DIAGNOSIS — M542 Cervicalgia: Secondary | ICD-10-CM | POA: Diagnosis not present

## 2014-03-31 DIAGNOSIS — M542 Cervicalgia: Secondary | ICD-10-CM | POA: Diagnosis not present

## 2014-04-04 DIAGNOSIS — M542 Cervicalgia: Secondary | ICD-10-CM | POA: Diagnosis not present

## 2014-04-12 DIAGNOSIS — M542 Cervicalgia: Secondary | ICD-10-CM | POA: Diagnosis not present

## 2014-04-12 DIAGNOSIS — N200 Calculus of kidney: Secondary | ICD-10-CM | POA: Diagnosis not present

## 2014-04-14 DIAGNOSIS — M542 Cervicalgia: Secondary | ICD-10-CM | POA: Diagnosis not present

## 2014-04-14 DIAGNOSIS — G243 Spasmodic torticollis: Secondary | ICD-10-CM | POA: Diagnosis not present

## 2014-04-19 DIAGNOSIS — M542 Cervicalgia: Secondary | ICD-10-CM | POA: Diagnosis not present

## 2014-04-21 DIAGNOSIS — M542 Cervicalgia: Secondary | ICD-10-CM | POA: Diagnosis not present

## 2014-04-26 DIAGNOSIS — M542 Cervicalgia: Secondary | ICD-10-CM | POA: Diagnosis not present

## 2014-04-27 DIAGNOSIS — M542 Cervicalgia: Secondary | ICD-10-CM | POA: Diagnosis not present

## 2014-04-28 DIAGNOSIS — M542 Cervicalgia: Secondary | ICD-10-CM | POA: Diagnosis not present

## 2014-05-03 DIAGNOSIS — M542 Cervicalgia: Secondary | ICD-10-CM | POA: Diagnosis not present

## 2014-05-05 DIAGNOSIS — M542 Cervicalgia: Secondary | ICD-10-CM | POA: Diagnosis not present

## 2014-05-11 ENCOUNTER — Encounter: Payer: Self-pay | Admitting: Family Medicine

## 2014-05-11 ENCOUNTER — Ambulatory Visit (INDEPENDENT_AMBULATORY_CARE_PROVIDER_SITE_OTHER): Payer: Medicare Other | Admitting: Family Medicine

## 2014-05-11 VITALS — BP 120/80 | HR 97 | Temp 98.1°F | Resp 16 | Ht 63.75 in | Wt 193.1 lb

## 2014-05-11 DIAGNOSIS — F329 Major depressive disorder, single episode, unspecified: Secondary | ICD-10-CM | POA: Diagnosis not present

## 2014-05-11 DIAGNOSIS — Z23 Encounter for immunization: Secondary | ICD-10-CM | POA: Diagnosis not present

## 2014-05-11 DIAGNOSIS — I10 Essential (primary) hypertension: Secondary | ICD-10-CM

## 2014-05-11 DIAGNOSIS — M810 Age-related osteoporosis without current pathological fracture: Secondary | ICD-10-CM | POA: Diagnosis not present

## 2014-05-11 DIAGNOSIS — M542 Cervicalgia: Secondary | ICD-10-CM | POA: Diagnosis not present

## 2014-05-11 DIAGNOSIS — F32A Depression, unspecified: Secondary | ICD-10-CM

## 2014-05-11 MED ORDER — FLUTICASONE PROPIONATE 50 MCG/ACT NA SUSP: 2.0000 | Freq: Every day | NASAL | Status: DC

## 2014-05-11 MED ORDER — HYDROCHLOROTHIAZIDE 25 MG PO TABS: ORAL_TABLET | ORAL | Status: DC

## 2014-05-11 MED ORDER — SIMVASTATIN 20 MG PO TABS: ORAL_TABLET | ORAL | Status: DC

## 2014-05-11 NOTE — Assessment & Plan Note (Signed)
New to provider.  Pt due for DEXA- will try and add to upcoming mammo.  Continue Ca and Vit D- add additional tx as DEXA warrants.  Pt expressed understanding and is in agreement w/ plan.

## 2014-05-11 NOTE — Assessment & Plan Note (Signed)
BP today much better than previous.  Pt is asymptomatic.  Reviewed recent labs.  No need for repeat today.  No med changes at this time.

## 2014-05-11 NOTE — Progress Notes (Signed)
Pre visit review using our clinic review tool, if applicable. No additional management support is needed unless otherwise documented below in the visit note. 

## 2014-05-11 NOTE — Assessment & Plan Note (Signed)
New to provider.  Pt reports sxs are improving since the death of her brother and that she is starting to find joy again- particularly w/ the upcoming arrival of grandbaby.  No need for controller medication at this time.  Will follow.

## 2014-05-11 NOTE — Patient Instructions (Addendum)
Schedule your complete physical in 6 months We'll call you with your bone density appt (we're trying to add it to your Nov 30th appt) Call and ask your insurance about getting the Tdap for the baby Your blood pressure looks great!  Keep up the good work! Call with any questions or concerns Happy Early Birthday!!!

## 2014-05-11 NOTE — Progress Notes (Signed)
   Subjective:    Patient ID: Katie Stout, female    DOB: 1946/04/24, 68 y.o.   MRN: [id number]  HPI HTN- chronic problem.  On HCTZ.  BP much better today than previous reading of 160.  Denies CP, SOB, HAs, visual changes, edema.  Depression- pt admits to hx of depression after brother's death.  Has low dose xanax available from previous MD.  Pt reports mood is improved and she is again happy and excited b/c daughter is having a baby in Jan.  Osteoporosis- chronic problem, DEXA done at [facility]- due.  On Ca and Vit D.  Has appt upcoming for mammo on Nov 30 11:00.   Review of Systems For ROS see HPI     Objective:   Physical Exam  Constitutional: She is oriented to person, place, and time. She appears well-developed and well-nourished. No distress.  HENT:  Head: Normocephalic and atraumatic.  Eyes: Conjunctivae and EOM are normal. Pupils are equal, round, and reactive to light.  Neck: Normal range of motion. Neck supple. No thyromegaly present.  Cardiovascular: Normal rate, regular rhythm, normal heart sounds and intact distal pulses.   No murmur heard. Pulmonary/Chest: Effort normal and breath sounds normal. No respiratory distress.  Abdominal: Soft. She exhibits no distension. There is no tenderness.  Musculoskeletal: She exhibits no edema.  Lymphadenopathy:    She has no cervical adenopathy.  Neurological: She is alert and oriented to person, place, and time.  Skin: Skin is warm and dry.  Psychiatric: She has a normal mood and affect. Her behavior is normal.          Assessment & Plan:

## 2014-05-13 DIAGNOSIS — M542 Cervicalgia: Secondary | ICD-10-CM | POA: Diagnosis not present

## 2014-05-17 DIAGNOSIS — M542 Cervicalgia: Secondary | ICD-10-CM | POA: Diagnosis not present

## 2014-05-19 DIAGNOSIS — M542 Cervicalgia: Secondary | ICD-10-CM | POA: Diagnosis not present

## 2014-06-07 ENCOUNTER — Telehealth: Payer: Self-pay

## 2014-06-07 ENCOUNTER — Ambulatory Visit (INDEPENDENT_AMBULATORY_CARE_PROVIDER_SITE_OTHER): Payer: Medicare Other | Admitting: Family Medicine

## 2014-06-07 ENCOUNTER — Encounter: Payer: Self-pay | Admitting: Family Medicine

## 2014-06-07 ENCOUNTER — Ambulatory Visit: Payer: Medicare Other

## 2014-06-07 ENCOUNTER — Ambulatory Visit (HOSPITAL_BASED_OUTPATIENT_CLINIC_OR_DEPARTMENT_OTHER)
Admission: RE | Admit: 2014-06-07 | Discharge: 2014-06-07 | Disposition: A | Discharge status: Home / Self Care | Payer: Medicare Other | Source: Ambulatory Visit | Attending: Family Medicine | Admitting: Family Medicine

## 2014-06-07 VITALS — BP 132/80 | HR 94 | Temp 98.2°F | Resp 16 | Wt 192.1 lb

## 2014-06-07 DIAGNOSIS — M25532 Pain in left wrist: Secondary | ICD-10-CM

## 2014-06-07 DIAGNOSIS — M25432 Effusion, left wrist: Secondary | ICD-10-CM

## 2014-06-07 DIAGNOSIS — S6992XA Unspecified injury of left wrist, hand and finger(s), initial encounter: Secondary | ICD-10-CM | POA: Diagnosis not present

## 2014-06-07 DIAGNOSIS — Z23 Encounter for immunization: Secondary | ICD-10-CM

## 2014-06-07 DIAGNOSIS — M7989 Other specified soft tissue disorders: Secondary | ICD-10-CM | POA: Diagnosis not present

## 2014-06-07 NOTE — Progress Notes (Signed)
   Subjective:    Patient ID: Katie Stout, female    DOB: [birth year], 68 y.o.   MRN: [id number]  HPI Fall- occurred Sunday while hanging a wreath.  Missed a step, fell on L hip, L wrist and hit L forehead.  Immediately iced head and wrist.  Pain w/ movement but able to floss teeth, move wrist.  No LOC.  Pt doesn't recall mechanism of fall but think she fell on outstretched wrist.  Pain is localized to radial side of thumb.   Review of Systems For ROS see HPI     Objective:   Physical Exam  Constitutional: She is oriented to person, place, and time. She appears well-developed and well-nourished. No distress.  Cardiovascular: Intact distal pulses.   Musculoskeletal: She exhibits edema (mild edema over left thumb extending into forearm w/ small lump over distal radius, mildly TTP).  Pain w/ L wrist flexion, no pain w/ extension or lateral deviation  Neurological: She is alert and oriented to person, place, and time. She has normal reflexes.  Skin: Skin is warm and dry. No erythema.  Vitals reviewed.         Assessment & Plan:

## 2014-06-07 NOTE — Assessment & Plan Note (Signed)
New.  Pt had fall 2 days ago, mechanism unknown but not likely to be [place] per report.  Now w/ mild pain and swelling over L wrist and extending into forearm.  Get xrays to r/o fx.  Suspect more bruising and soft tissue damage.  Splint provided.  NSAIDs prn.  Reviewed supportive care and red flags that should prompt return.  Pt expressed understanding and is in agreement w/ plan.

## 2014-06-07 NOTE — Patient Instructions (Signed)
Go downstairs and get your Annabelle Harman notify you of your results Ibuprofen as needed for pain and swelling Continue to ice Only wear the brace as needed for comfort Call with any questions or concerns Hang in there!!

## 2014-06-07 NOTE — Progress Notes (Signed)
Pre visit review using our clinic review tool, if applicable. No additional management support is needed unless otherwise documented below in the visit note. 

## 2014-06-07 NOTE — Telephone Encounter (Signed)
Takiyah Bohnsack [phone] [facility] Jerilynn Mages)  Nyriah called to see what the results of her xray was.

## 2014-06-07 NOTE — Addendum Note (Signed)
Addended by: Kris Hartmann on: 06/07/2014 12:53 PM   Modules accepted: Orders

## 2014-06-08 ENCOUNTER — Other Ambulatory Visit: Payer: Self-pay | Admitting: Family Medicine

## 2014-06-08 DIAGNOSIS — M25532 Pain in left wrist: Secondary | ICD-10-CM

## 2014-06-08 NOTE — Telephone Encounter (Signed)
Patient notified by Janus Molder.

## 2014-06-09 DIAGNOSIS — G5602 Carpal tunnel syndrome, left upper limb: Secondary | ICD-10-CM | POA: Diagnosis not present

## 2014-06-09 DIAGNOSIS — G5601 Carpal tunnel syndrome, right upper limb: Secondary | ICD-10-CM | POA: Diagnosis not present

## 2014-06-09 DIAGNOSIS — M25552 Pain in left hip: Secondary | ICD-10-CM | POA: Diagnosis not present

## 2014-06-13 DIAGNOSIS — E559 Vitamin D deficiency, unspecified: Secondary | ICD-10-CM | POA: Diagnosis not present

## 2014-06-13 DIAGNOSIS — Z780 Asymptomatic menopausal state: Secondary | ICD-10-CM | POA: Diagnosis not present

## 2014-06-13 LAB — HM DEXA SCAN

## 2014-06-23 ENCOUNTER — Telehealth: Payer: Self-pay | Admitting: Family Medicine

## 2014-06-23 NOTE — Telephone Encounter (Signed)
Caller name:Luu, Dyann Ruddle Relation to PN:TIRW Call back Ingram:  Reason for call: pt states she never received her results from her bone density test that was done 11 days ago

## 2014-06-23 NOTE — Telephone Encounter (Signed)
Pt notified that we do not have the results yet.

## 2014-07-19 NOTE — Telephone Encounter (Signed)
Pt is following up states she still has not yet received her results, stat [facility] informed her that they mailed the results out on 12/7. Pt would like a call back.

## 2014-07-19 NOTE — Telephone Encounter (Signed)
Have you seen these results? I have not seen them yet?

## 2014-07-20 NOTE — Telephone Encounter (Signed)
I have not seen these results 

## 2014-07-20 NOTE — Telephone Encounter (Signed)
Forms received via mail today and placed in Dr. Virgil Benedict folder. JG//CMA

## 2014-07-22 ENCOUNTER — Encounter: Payer: Self-pay | Admitting: General Practice

## 2014-07-27 ENCOUNTER — Telehealth: Payer: Self-pay | Admitting: Family Medicine

## 2014-07-27 NOTE — Telephone Encounter (Signed)
Agree with plan 

## 2014-07-27 NOTE — Telephone Encounter (Signed)
[facility] - Client TELEPHONE ADVICE RECORD [facility] Call [facility] [name] DOB: May 25, 1946 Initial Comment Caller states they have right leg weakness. Nurse Assessment Nurse: Donalynn Furlong, RN, Myna Hidalgo Date/Time Eilene Ghazi Time): 07/27/2014 9:52:36 AM Confirm and document reason for call. If symptomatic, describe symptoms. ---Caller states they have right leg weakness. Pt states she has had this on and off for a couple of years.. Pt states she has been on Simvastatin for many years. Pt states she now feels ok. She called the Pharmacy ([facility]) and spoke to Pharmacist, who told her "the old manufacturer of that drug doesn't make it anymore, and a new one now makes it". She remains on the same dosing schedule. Has the patient traveled out of the country within the last 30 days? ---No Does the patient require triage? ---Yes Related visit to physician within the last 2 weeks? ---No Does the PT have any chronic conditions? (i.e. diabetes, asthma, etc.) ---Yes List chronic conditions. ---hypothyroidism, chronic low potassium Guidelines Guideline Title Affirmed Question Affirmed Notes Neurologic Deficit [1] Tingling in foot (e.g., pins and needles) AND [2] after prolonged sitting AND [3] transient Final Disposition [place], RN, Myna Hidalgo Comments Although initially pt denied any other chronic health pbs, pt revealed towards end of call she takes HCTZ for HBP, does not "drink enough fluid, and takes a potassium supplement". Also discussed adequate fluid intake needs, and discussed foods high in Potassium, encouraging increasing her intake of those fluids. Will call

## 2014-08-03 ENCOUNTER — Telehealth: Payer: Self-pay | Admitting: Family Medicine

## 2014-08-03 NOTE — Telephone Encounter (Signed)
This would be fine.  She's a lovely woman

## 2014-08-03 NOTE — Telephone Encounter (Signed)
Fine with me

## 2014-08-03 NOTE — Telephone Encounter (Signed)
Patient would like to transfer from Dr. Birdie Riddle to Dr. Doug Sou. Is this ok? Thanks.

## 2014-08-04 NOTE — Telephone Encounter (Signed)
Appointment scheduled.

## 2014-08-11 ENCOUNTER — Telehealth: Payer: Self-pay | Admitting: Family Medicine

## 2014-08-11 NOTE — Telephone Encounter (Signed)
Would recommend that she try to get in with Dr. Birdie Riddle acutely for the leg and keep April appointment.

## 2014-08-11 NOTE — Telephone Encounter (Signed)
Left message for patient to call me back. 

## 2014-08-11 NOTE — Telephone Encounter (Signed)
Patient is experiencing a lot of leg pain but her new patient appointment is not until April.  She would like to know if the doctor could possibly work her in before April.  Please advise.

## 2014-08-16 DIAGNOSIS — M1711 Unilateral primary osteoarthritis, right knee: Secondary | ICD-10-CM | POA: Diagnosis not present

## 2014-08-25 DIAGNOSIS — G243 Spasmodic torticollis: Secondary | ICD-10-CM | POA: Diagnosis not present

## 2014-09-01 DIAGNOSIS — H01005 Unspecified blepharitis left lower eyelid: Secondary | ICD-10-CM | POA: Diagnosis not present

## 2014-09-01 DIAGNOSIS — H01004 Unspecified blepharitis left upper eyelid: Secondary | ICD-10-CM | POA: Diagnosis not present

## 2014-09-01 DIAGNOSIS — H01002 Unspecified blepharitis right lower eyelid: Secondary | ICD-10-CM | POA: Diagnosis not present

## 2014-09-01 DIAGNOSIS — H01001 Unspecified blepharitis right upper eyelid: Secondary | ICD-10-CM | POA: Diagnosis not present

## 2014-09-08 DIAGNOSIS — H01004 Unspecified blepharitis left upper eyelid: Secondary | ICD-10-CM | POA: Diagnosis not present

## 2014-09-08 DIAGNOSIS — H01001 Unspecified blepharitis right upper eyelid: Secondary | ICD-10-CM | POA: Diagnosis not present

## 2014-09-08 DIAGNOSIS — H01005 Unspecified blepharitis left lower eyelid: Secondary | ICD-10-CM | POA: Diagnosis not present

## 2014-09-08 DIAGNOSIS — H10413 Chronic giant papillary conjunctivitis, bilateral: Secondary | ICD-10-CM | POA: Diagnosis not present

## 2014-09-08 DIAGNOSIS — H01002 Unspecified blepharitis right lower eyelid: Secondary | ICD-10-CM | POA: Diagnosis not present

## 2014-10-24 ENCOUNTER — Encounter: Payer: Self-pay | Admitting: *Deleted

## 2014-10-27 ENCOUNTER — Encounter: Payer: Self-pay | Admitting: Internal Medicine

## 2014-10-27 ENCOUNTER — Ambulatory Visit (INDEPENDENT_AMBULATORY_CARE_PROVIDER_SITE_OTHER): Payer: Medicare Other | Admitting: Internal Medicine

## 2014-10-27 ENCOUNTER — Other Ambulatory Visit (INDEPENDENT_AMBULATORY_CARE_PROVIDER_SITE_OTHER): Payer: Medicare Other

## 2014-10-27 VITALS — BP 138/90 | HR 94 | Temp 98.1°F | Wt 194.0 lb

## 2014-10-27 DIAGNOSIS — B351 Tinea unguium: Secondary | ICD-10-CM | POA: Diagnosis not present

## 2014-10-27 DIAGNOSIS — I10 Essential (primary) hypertension: Secondary | ICD-10-CM

## 2014-10-27 DIAGNOSIS — G4700 Insomnia, unspecified: Secondary | ICD-10-CM | POA: Diagnosis not present

## 2014-10-27 LAB — BASIC METABOLIC PANEL
BUN: 13 mg/dL (ref 6–23)
CHLORIDE: 99 meq/L (ref 96–112)
CO2: 32 meq/L (ref 19–32)
CREATININE: 0.64 mg/dL (ref 0.40–1.20)
Calcium: 9.7 mg/dL (ref 8.4–10.5)
GFR: 97.66 mL/min (ref >60.00)
GLUCOSE: 94 mg/dL (ref 70–99)
POTASSIUM: 3.6 meq/L (ref 3.5–5.1)
SODIUM: 138 meq/L (ref 135–145)

## 2014-10-27 LAB — CK: Total CK: 98 U/L (ref 7–177)

## 2014-10-27 MED ORDER — ALPRAZOLAM 0.25 MG PO TABS: 0.2500 mg | ORAL_TABLET | Freq: Every evening | ORAL | Status: DC | PRN

## 2014-10-27 NOTE — Assessment & Plan Note (Signed)
She has been treated for this is in the past but never truly addressed. She does have sleepless nights with caring for a 62 month old and uses the xanax on nights when she is unable to sleep (not caring for the infant) and gets to sleep well. Will refill her xanax 0.25 mg #30 refills 1 and if needs refill can re-address. Talked with her about trying melatonin over the counter (5 mg nightly) as a safer alternative. She does request a medication that is non-habit forming and not addictive which xanax can be although she is low risk since she does not use every night and is on low dosage.

## 2014-10-27 NOTE — Progress Notes (Signed)
Patient received education resource, including the self-management goal and tool. Patient verbalized understanding. 

## 2014-10-27 NOTE — Patient Instructions (Addendum)
You can try the melatonin for sleep (5 mg at night) which may take up to 1 week to start working well.   We will check the blood work today and have you stop the zocor for 2-3 weeks to see if the muscle weaknesses go away.   We will send in potassium for you based on the lab results today.   Come back in about 6 months for a check up of the blood pressure which is great today.   Exercise to Stay Healthy Exercise helps you become and stay healthy. EXERCISE IDEAS AND TIPS Choose exercises that:  You enjoy.  Fit into your day. You do not need to exercise really hard to be healthy. You can do exercises at a slow or medium level and stay healthy. You can:  Stretch before and after working out.  Try yoga, Pilates, or tai chi.  Lift weights.  Walk fast, swim, jog, run, climb stairs, bicycle, dance, or rollerskate.  Take aerobic classes. Exercises that burn about 150 calories:  Running 1  miles in 15 minutes.  Playing volleyball for 45 to 60 minutes.  Washing and waxing a car for 45 to 60 minutes.  Playing touch football for 45 minutes.  Walking 1  miles in 35 minutes.  Pushing a stroller 1  miles in 30 minutes.  Playing basketball for 30 minutes.  Raking leaves for 30 minutes.  Bicycling 5 miles in 30 minutes.  Walking 2 miles in 30 minutes.  Dancing for 30 minutes.  Shoveling snow for 15 minutes.  Swimming laps for 20 minutes.  Walking up stairs for 15 minutes.  Bicycling 4 miles in 15 minutes.  Gardening for 30 to 45 minutes.  Jumping rope for 15 minutes.  Washing windows or floors for 45 to 60 minutes. Document Released: 07/27/2010 Document Revised: 09/16/2011 Document Reviewed: 07/27/2010 Exit[employer] Patient Information 2015 [employer]. This information is not intended to replace advice given to you by your health care provider. Make sure you discuss any questions you have with your health care provider.  

## 2014-10-27 NOTE — Progress Notes (Signed)
   Subjective:    Patient ID: Katie Stout, female    DOB: [birth year], 69 y.o.   MRN: [id number]  HPI The patient is coming in for insomnia. She has had it off and on for many years. She does have some increased stress in her life including caring for a grandchild overnight (parents are EMT and work nights). She does use xanax for initiating sleep on nights she is not caring for the child. She is able to use it to get to sleep and stay asleep for about 6 hours. Does not wake feeling tired or drowsy. Uses maybe 5 times per month.   Review of Systems  Constitutional: Negative for fever, activity change, appetite change, fatigue and unexpected weight change.  Respiratory: Negative.   Cardiovascular: Negative.   Gastrointestinal: Positive for abdominal pain and diarrhea. Negative for constipation and abdominal distention.       Rare  Musculoskeletal: Negative.   Skin: Negative.   Psychiatric/Behavioral: Positive for sleep disturbance. Negative for dysphoric mood and decreased concentration. The patient is not nervous/anxious.       Objective:   Physical Exam  Constitutional: She is oriented to person, place, and time. She appears well-developed and well-nourished.  HENT:  Head: Normocephalic and atraumatic.  Eyes: EOM are normal.  Neck: Normal range of motion.  Cardiovascular: Normal rate and regular rhythm.   Pulmonary/Chest: Effort normal and breath sounds normal.  Abdominal: Soft.  Musculoskeletal: She exhibits no edema.  Neurological: She is alert and oriented to person, place, and time. Coordination normal.  Skin: Skin is warm and dry.  Psychiatric: She has a normal mood and affect.   Filed Vitals:   10/27/14 0925  BP: 138/90  Pulse: 94  Temp: 98.1 F (36.7 C)  Weight: 194 lb (87.998 kg)  SpO2: 98%      Assessment & Plan:

## 2014-10-31 ENCOUNTER — Other Ambulatory Visit: Payer: Self-pay | Admitting: Dermatology

## 2014-10-31 DIAGNOSIS — D239 Other benign neoplasm of skin, unspecified: Secondary | ICD-10-CM | POA: Diagnosis not present

## 2014-10-31 DIAGNOSIS — D485 Neoplasm of uncertain behavior of skin: Secondary | ICD-10-CM | POA: Diagnosis not present

## 2014-10-31 DIAGNOSIS — L821 Other seborrheic keratosis: Secondary | ICD-10-CM | POA: Diagnosis not present

## 2014-11-02 ENCOUNTER — Telehealth: Payer: Self-pay | Admitting: Internal Medicine

## 2014-11-02 NOTE — Telephone Encounter (Signed)
Left message for patient to call me back. 

## 2014-11-02 NOTE — Telephone Encounter (Signed)
Pt called in and has a few questions about her labs that she had on the 21st?    Cell number [phone]

## 2014-11-03 DIAGNOSIS — S56912A Strain of unspecified muscles, fascia and tendons at forearm level, left arm, initial encounter: Secondary | ICD-10-CM | POA: Diagnosis not present

## 2014-11-08 ENCOUNTER — Encounter: Payer: Self-pay | Admitting: Podiatry

## 2014-11-08 ENCOUNTER — Ambulatory Visit (INDEPENDENT_AMBULATORY_CARE_PROVIDER_SITE_OTHER): Payer: Medicare Other | Admitting: Podiatry

## 2014-11-08 VITALS — BP 150/85 | HR 95 | Resp 16 | Ht 63.0 in | Wt 190.0 lb

## 2014-11-08 DIAGNOSIS — M2012 Hallux valgus (acquired), left foot: Secondary | ICD-10-CM

## 2014-11-08 DIAGNOSIS — L608 Other nail disorders: Secondary | ICD-10-CM

## 2014-11-08 DIAGNOSIS — L601 Onycholysis: Secondary | ICD-10-CM | POA: Diagnosis not present

## 2014-11-08 DIAGNOSIS — Q828 Other specified congenital malformations of skin: Secondary | ICD-10-CM | POA: Diagnosis not present

## 2014-11-08 DIAGNOSIS — L603 Nail dystrophy: Secondary | ICD-10-CM

## 2014-11-08 DIAGNOSIS — B351 Tinea unguium: Secondary | ICD-10-CM | POA: Diagnosis not present

## 2014-11-08 NOTE — Addendum Note (Signed)
Addended by: Clovis Riley E on: 11/08/2014 02:29 PM   Modules accepted: Orders

## 2014-11-08 NOTE — Progress Notes (Signed)
   Subjective:    Patient ID: Katie Stout, female    DOB: 04-Dec-1945, 69 y.o.   MRN: [id number]  HPI Comments: "I have some dark toenails"  Patient c/o thick, discolored 1st toenails bilateral for about 4 years. She had her toenails done for her daughter's wedding and ever since she has noticed the nails looking worse. She tries to keep trimmed down. Would like to get the nails trimmed-she can't see well and her knees will not bend to get to them.   Also, calluses plantar forefoot bilateral      Review of Systems  HENT: Positive for hearing loss and sinus pressure.   Musculoskeletal: Positive for myalgias and arthralgias.  Skin:       Change in nails  Allergic/Immunologic: Positive for environmental allergies.  All other systems reviewed and are negative.      Objective:   Physical Exam: I have reviewed her past medical history medications allergies surgery social history and review of systems. Pulses are strongly palpable lateral. Neurologic sensorium is intact percent with the monofilament. Deep tendon reflexes are intact bilateral muscle strength +5 over 5 dorsiflexion plantar flexors and inverters and everters all intrinsic musculature is intact. Orthopedic evaluation demonstrates hallux abductovalgus deformity of the left foot. Hammertoe deformity second digit left foot. Cutaneous evaluation demonstrates thickened yellow dystrophic possibly mycotic nails hallux bilaterally        Assessment & Plan:  Assessment: Nail dystrophy can rule out onychomycosis and tinea pedis.  Plan: Samples of the nail and skin were taken today for pathologic evaluation. I will follow-up with her with results.

## 2014-11-08 NOTE — Patient Instructions (Signed)

## 2014-11-10 ENCOUNTER — Ambulatory Visit: Payer: Federal, State, Local not specified - PPO | Admitting: Internal Medicine

## 2014-11-14 ENCOUNTER — Encounter: Payer: Self-pay | Admitting: Internal Medicine

## 2014-11-14 ENCOUNTER — Encounter: Payer: Medicare Other | Admitting: Family Medicine

## 2014-11-14 DIAGNOSIS — E785 Hyperlipidemia, unspecified: Secondary | ICD-10-CM

## 2014-11-17 DIAGNOSIS — H26493 Other secondary cataract, bilateral: Secondary | ICD-10-CM | POA: Diagnosis not present

## 2014-11-21 ENCOUNTER — Ambulatory Visit (INDEPENDENT_AMBULATORY_CARE_PROVIDER_SITE_OTHER): Payer: Medicare Other | Admitting: Internal Medicine

## 2014-11-21 ENCOUNTER — Encounter: Payer: Self-pay | Admitting: Internal Medicine

## 2014-11-21 VITALS — BP 138/78 | HR 96 | Ht 63.5 in | Wt 192.0 lb

## 2014-11-21 DIAGNOSIS — K219 Gastro-esophageal reflux disease without esophagitis: Secondary | ICD-10-CM

## 2014-11-21 DIAGNOSIS — Z800 Family history of malignant neoplasm of digestive organs: Secondary | ICD-10-CM | POA: Diagnosis not present

## 2014-11-21 MED ORDER — HYOSCYAMINE SULFATE 0.125 MG SL SUBL: 0.1250 mg | SUBLINGUAL_TABLET | SUBLINGUAL | Status: DC | PRN

## 2014-11-21 NOTE — Progress Notes (Signed)
Patient ID: Katie [facility]out, female   DOB: [birth year], 69 y.o.   MRN: [id number] HPI: Katie Stout is a 69 year old female previous he managed by Dr. Verl Stout with a history of GERD with esophageal spasm, family history of colorectal cancer in her mother who seen for follow-up. She also has a history of hypertension, kidney stones, spastic torticollis. She was last seen in the office on 08/03/2013. At that time she was asymptomatic on Nexium 40 mg daily. She has weaned off Nexium and done very well. She is now using occasional Tums with Gas-X and hyoscyamine. This works very well. She reports very infrequent heartburn and esophageal spasm symptoms. She estimates less than once per week. No dysphagia or odynophagia. Appetite is good. No weight loss. Neuro satiety. Bowel movements have been regular for her and she denies blood in her stool or melena. She became a grandmother on 07/07/2014 and this has been a great joy for her.  Past Medical History  Diagnosis Date  . Diverticulosis of colon (without mention of hemorrhage)   . GERD (gastroesophageal reflux disease)   . Other and unspecified hyperlipidemia   . Post-nasal drip 04/10/2012  . Hypertension     under control, has been on med. x 7 yrs.  . 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 Palpitations     "all my life" - states no problems; states had stress test 2006; no cardiologist  . Complication of anesthesia     states had flashing lights x 6 wks. post-op in 1992  . Dental crowns present   . History of kidney stones   . Anxiety   . Difficulty swallowing solids     states difficulty swallowing dry foods  . Rash     breasts  . Eczema   . 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 Hiatal hernia     states "small"  . H. pylori infection     Past Surgical History  Procedure Laterality Date  . 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 Cesarean section    . Abdominal hysterectomy  1993    complete  . Oophorectomy  1992    x 1  . Cataract extraction, bilateral  2007  . Orif ulnar fracture  01/16/2000    left: also radial head dislocation  . Larynx surgery  11/2011    vocal cord biopsy  . Esophagoscopy  04/14/2012    Procedure: ESOPHAGOSCOPY;  Surgeon: Katie Nunnery, MD;  [facility]: [facility];  Service: ENT;  Laterality: Right;  . Nasal hemorrhage control  04/14/2012    Procedure: EPISTAXIS CONTROL;  Surgeon: Katie Nunnery, MD;  [facility];  Service: ENT;  Laterality: Right;  Cauterization of Right Septal Vessel  . Colonoscopy  02/10/2013  . Esophagogastroduodenoscopy  02/10/2013    Outpatient Prescriptions Prior to Visit  Medication Sig Dispense Refill  . ALPRAZolam (XANAX) 0.25 MG tablet Take 1 tablet (0.25 mg total) by mouth at bedtime as needed for sleep. 30 tablet 2  . calcium carbonate (TUMS CALCIUM FOR LIFE BONE) 750 MG chewable tablet Chew 2-4 tablets by mouth daily.      . cholecalciferol (VITAMIN D) 1000 UNITS tablet Take 1,000 Units by mouth daily.      . fluticasone (FLONASE) 50 MCG/ACT nasal spray Place 2 sprays into both nostrils daily. 16 g 3  . hydrochlorothiazide (HYDRODIURIL)  25 MG tablet TAKE 1 TABLET (25 MG TOTAL) BY MOUTH DAILY. 90 tablet 1  . simvastatin (ZOCOR) 20 MG tablet TAKE 1 TABLET BY MOUTH EVERY DAY AT 6PM 90 tablet 1  . hyoscyamine (LEVSIN SL) 0.125 MG SL tablet Place 1 tablet (0.125 mg total) under the tongue every 4 (four) hours as needed for cramping. 30 tablet 2  . Potassium Chloride CRYS by Does not apply route.     No facility-administered medications prior to visit.    Allergies  Allergen Reactions  . Penicillins Hives  . Adhesive [Tape] Other (See Comments)    SKIN REDNESS AND IRRITATION  . Codeine Nausea Only    Family History  Problem Relation Age of Onset  . Coronary artery disease Mother   . Heart attack Mother   .  Breast cancer Mother   . Colon cancer Mother   . Hypertension Father   . Thrombocytopenia Father   . Other Father     ITP  . Prostate cancer Brother     History  Substance Use Topics  . Smoking status: Never Smoker   . Smokeless tobacco: Never Used  . Alcohol Use: No    ROS: As per history of present illness, otherwise negative  BP 138/78 mmHg  Pulse 96  Ht 5' 3.5" (1.613 m)  Wt 192 lb (87.091 kg)  BMI 33.47 kg/m2 Constitutional: Well-developed and well-nourished. No distress. HEENT: Normocephalic and atraumatic. Oropharynx is clear and moist. No oropharyngeal exudate. Conjunctivae are normal.  No scleral icterus. Neck: Neck supple. Trachea midline. Cardiovascular: Normal rate, regular rhythm and intact distal pulses. No M/R/G Pulmonary/chest: Effort normal and breath sounds normal. No wheezing, rales or rhonchi. Abdominal: Soft, nontender, nondistended. Bowel sounds active throughout.  Extremities: no clubbing, cyanosis, or edema Lymphadenopathy: No cervical adenopathy noted. Neurological: Alert and oriented to person place and time. Skin: Skin is warm and dry. No rashes noted. Psychiatric: Normal mood and affect. Behavior is normal.  RELEVANT LABS AND IMAGING: CBC    Component Value Date/Time   WBC 5.8 02/25/2014 1346   RBC 4.66 02/25/2014 1346   HGB 14.3 02/25/2014 1346   HCT 42.6 02/25/2014 1346   PLT 214.0 02/25/2014 1346   MCV 91.4 02/25/2014 1346   MCHC 33.7 02/25/2014 1346   RDW 14.1 02/25/2014 1346   LYMPHSABS 1.1 02/25/2014 1346   MONOABS 0.4 02/25/2014 1346   EOSABS 0.2 02/25/2014 1346   BASOSABS 0.0 02/25/2014 1346    CMP     Component Value Date/Time   NA 138 10/27/2014 0957   K 3.6 10/27/2014 0957   CL 99 10/27/2014 0957   CO2 32 10/27/2014 0957   GLUCOSE 94 10/27/2014 0957   BUN 13 10/27/2014 0957   CREATININE 0.64 10/27/2014 0957   CALCIUM 9.7 10/27/2014 0957   PROT 7.0 02/25/2014 1346   ALBUMIN 4.0 02/25/2014 1346   AST 24 02/25/2014  1346   ALT 25 02/25/2014 1346   ALKPHOS 45 02/25/2014 1346   BILITOT 2.3* 02/25/2014 1346   GFRNONAA >90 04/13/2012 1100   GFRAA >90 04/13/2012 1100    ASSESSMENT/PLAN: 69 year old female previous he managed by Dr. Verl Stout with a history of GERD with esophageal spasm, family history of colorectal cancer in her mother who seen for follow-up.  1. GERD/esophageal spasm -- controlled off PPI which is ideal with her history of osteopenia. Infrequent symptoms without alarm symptoms. Can continue Tums and Levsin on an as-needed basis. She is asked to call if her heartburn or GERD  returns in that it becomes more frequent or should she develop upper abdominal pain, or any trouble swallowing. She voices understanding  2. Family history of colon cancer -- she had a normal colonoscopy which has been reviewed on 02/10/2013. Other than moderate diverticulosis in the left colon. Repeat colonoscopy recommended August 2019 due to family history. She is aware of this recommendation  Follow up as needed.    KM:MNOTRRNHA E Tabori, [address] [place] [place], [place] [id number]

## 2014-11-21 NOTE — Patient Instructions (Signed)
We have sent the following medications to your pharmacy for you to pick up at your convenience: [facility] will be due for a recall colonoscopy in 02/2018. We will send you a reminder in the mail when it gets closer to that time.  Please follow up with Dr Hilarie Fredrickson as needed.

## 2014-12-01 ENCOUNTER — Telehealth: Payer: Self-pay | Admitting: Internal Medicine

## 2014-12-01 DIAGNOSIS — H26491 Other secondary cataract, right eye: Secondary | ICD-10-CM | POA: Diagnosis not present

## 2014-12-01 NOTE — Telephone Encounter (Signed)
Ok

## 2014-12-01 NOTE — Telephone Encounter (Signed)
Patient called and you took her off of simvastatin due to leg pain. She wanted to let you know that her leg pain is so much better. I did make an appointment for her in a couple weeks to get her cholesterol checked to be sure it is fine.

## 2014-12-08 ENCOUNTER — Ambulatory Visit: Payer: Medicare Other | Admitting: Podiatry

## 2014-12-19 ENCOUNTER — Other Ambulatory Visit (INDEPENDENT_AMBULATORY_CARE_PROVIDER_SITE_OTHER): Payer: Medicare Other

## 2014-12-19 DIAGNOSIS — E785 Hyperlipidemia, unspecified: Secondary | ICD-10-CM | POA: Diagnosis not present

## 2014-12-19 LAB — LIPID PANEL
CHOL/HDL RATIO: 4
Cholesterol: 288 mg/dL — ABNORMAL HIGH (ref 0–200)
HDL: 65.7 mg/dL (ref >39.00)
LDL Cholesterol: 193 mg/dL — ABNORMAL HIGH (ref 0–99)
NONHDL: 222.3
Triglycerides: 147 mg/dL (ref 0.0–149.0)
VLDL: 29.4 mg/dL (ref 0.0–40.0)

## 2014-12-20 ENCOUNTER — Encounter: Payer: Self-pay | Admitting: Podiatry

## 2014-12-20 ENCOUNTER — Ambulatory Visit (INDEPENDENT_AMBULATORY_CARE_PROVIDER_SITE_OTHER): Payer: Medicare Other | Admitting: Podiatry

## 2014-12-20 VITALS — BP 156/78 | HR 89 | Resp 16

## 2014-12-20 DIAGNOSIS — Z79899 Other long term (current) drug therapy: Secondary | ICD-10-CM

## 2014-12-20 DIAGNOSIS — L603 Nail dystrophy: Secondary | ICD-10-CM

## 2014-12-20 LAB — HEPATIC FUNCTION PANEL
ALBUMIN: 4.2 g/dL (ref 3.5–5.2)
ALT: 21 U/L (ref 0–35)
AST: 20 U/L (ref 0–37)
Alkaline Phosphatase: 51 U/L (ref 39–117)
BILIRUBIN TOTAL: 1.4 mg/dL — AB (ref 0.2–1.2)
Bilirubin, Direct: 0.2 mg/dL (ref 0.0–0.3)
Indirect Bilirubin: 1.2 mg/dL (ref 0.2–1.2)
TOTAL PROTEIN: 6.9 g/dL (ref 6.0–8.3)

## 2014-12-20 LAB — CBC WITH DIFFERENTIAL/PLATELET
BASOS PCT: 0 % (ref 0–1)
Basophils Absolute: 0 10*3/uL (ref 0.0–0.1)
EOS ABS: 0.3 10*3/uL (ref 0.0–0.7)
Eosinophils Relative: 5 % (ref 0–5)
HCT: 41.8 % (ref 36.0–46.0)
Hemoglobin: 14.4 g/dL (ref 12.0–15.0)
LYMPHS ABS: 1.3 10*3/uL (ref 0.7–4.0)
Lymphocytes Relative: 21 % (ref 12–46)
MCH: 29.8 pg (ref 26.0–34.0)
MCHC: 34.4 g/dL (ref 30.0–36.0)
MCV: 86.4 fL (ref 78.0–100.0)
MPV: 10.3 fL (ref 8.6–12.4)
Monocytes Absolute: 0.4 10*3/uL (ref 0.1–1.0)
Monocytes Relative: 7 % (ref 3–12)
NEUTROS PCT: 67 % (ref 43–77)
Neutro Abs: 4.1 10*3/uL (ref 1.7–7.7)
Platelets: 268 10*3/uL (ref 150–400)
RBC: 4.84 MIL/uL (ref 3.87–5.11)
RDW: 13.8 % (ref 11.5–15.5)
WBC: 6.1 10*3/uL (ref 4.0–10.5)

## 2014-12-20 MED ORDER — TERBINAFINE HCL 250 MG PO TABS: 250.0000 mg | ORAL_TABLET | Freq: Every day | ORAL | Status: DC

## 2014-12-20 NOTE — Patient Instructions (Signed)

## 2014-12-20 NOTE — Progress Notes (Signed)
Patient presents today for follow-up of fungal culture for the toenails.  Objective: Onychomycosis pathology report.  Assessment: Onychomycosis.  Plan: Treatment with Lamisil 250 mg tablets 1 by mouth daily 30. We have also requested blood work consisting of a liver profile and a CBC. I will follow-up with patient in 1 month. We discussed the possible complications of this medication as well as its benefits and side effects. Patient will notify us should any of these arise.

## 2014-12-21 ENCOUNTER — Encounter: Payer: Self-pay | Admitting: Internal Medicine

## 2014-12-21 ENCOUNTER — Telehealth: Payer: Self-pay | Admitting: *Deleted

## 2014-12-21 ENCOUNTER — Ambulatory Visit (INDEPENDENT_AMBULATORY_CARE_PROVIDER_SITE_OTHER): Payer: Medicare Other | Admitting: Internal Medicine

## 2014-12-21 VITALS — BP 138/62 | HR 97 | Temp 98.8°F | Resp 18 | Ht 63.0 in | Wt 192.8 lb

## 2014-12-21 DIAGNOSIS — E785 Hyperlipidemia, unspecified: Secondary | ICD-10-CM

## 2014-12-21 MED ORDER — ROSUVASTATIN CALCIUM 10 MG PO TABS: 10.0000 mg | ORAL_TABLET | Freq: Every day | ORAL | Status: DC

## 2014-12-21 NOTE — Telephone Encounter (Signed)
No problems with kidney stones

## 2014-12-21 NOTE — Telephone Encounter (Addendum)
Pt states she received the message concerning her labs and medication, but she had a question she wanted to ask Dr. Milinda Pointer.  Pt states she has had a hx of kidney stones, and wanted to know if it was okay for her to take Lamisil. I informed pt of Dr. Stephenie Acres statement.

## 2014-12-21 NOTE — Assessment & Plan Note (Signed)
Has had to stop zocor due to side effects of shin pains and cramps. Since cholesterol is high and she needs treatment will start crestor 10 mg daily. Will recheck in about 3-6 months.

## 2014-12-21 NOTE — Progress Notes (Signed)
Pre visit review using our clinic review tool, if applicable. No additional management support is needed unless otherwise documented below in the visit note. 

## 2014-12-21 NOTE — Patient Instructions (Signed)
We will send in crestor which is also a cholesterol medicine (also called rosuvastatin) that has a lot less side effects and should not give you muscle problems.  Come back in about 3-6 months so we can check the cholesterol and call us if you have any problems with the new medicine.

## 2014-12-21 NOTE — Progress Notes (Signed)
   Subjective:    Patient ID: [facility], female    DOB: Feb 10, 1946, 69 y.o.   MRN: [id number]  HPI The patient is a 69 YO female who is coming in to talk about her cholesterol. She stopped taking her simvastatin about 2 months ago since it was giving her muscle aches. She has noticed that the muscle aches are gone now. However her cholesterol has increased with recent blood work. She has been trying to exercise and eat well. Her mother had lots of problems with her cholesterol as well.    Review of Systems  Constitutional: Negative for fever, activity change, appetite change, fatigue and unexpected weight change.  Respiratory: Negative.   Cardiovascular: Negative.   Gastrointestinal: Negative for constipation and abdominal distention.  Musculoskeletal: Negative.  Negative for myalgias.      Objective:   Physical Exam  Constitutional: She is oriented to person, place, and time. She appears well-developed and well-nourished.  HENT:  Head: Normocephalic and atraumatic.  Eyes: EOM are normal.  Neck: Normal range of motion.  Cardiovascular: Normal rate and regular rhythm.   Pulmonary/Chest: Effort normal and breath sounds normal.  Abdominal: Soft.  Musculoskeletal: She exhibits no edema.  Neurological: She is alert and oriented to person, place, and time. Coordination normal.  Skin: Skin is warm and dry.  Psychiatric: She has a normal mood and affect.   Filed Vitals:   12/21/14 1031  BP: 138/62  Pulse: 97  Temp: 98.8 F (37.1 C)  TempSrc: Oral  Resp: 18  Height: 5\' 3"  (1.6 m)  Weight: 192 lb 12.8 oz (87.454 kg)  SpO2: 99%      Assessment & Plan:

## 2014-12-29 DIAGNOSIS — H35431 Paving stone degeneration of retina, right eye: Secondary | ICD-10-CM | POA: Diagnosis not present

## 2014-12-29 DIAGNOSIS — H43811 Vitreous degeneration, right eye: Secondary | ICD-10-CM | POA: Diagnosis not present

## 2015-01-04 ENCOUNTER — Telehealth: Payer: Self-pay | Admitting: *Deleted

## 2015-01-04 NOTE — Telephone Encounter (Signed)
Pt states she will be 7 pills short of her appt date with Dr. Milinda Pointer.  I told pt the medication will stay in her system longer than the 7 days to get her to the appt date.

## 2015-01-05 DIAGNOSIS — G243 Spasmodic torticollis: Secondary | ICD-10-CM | POA: Diagnosis not present

## 2015-01-17 DIAGNOSIS — H43813 Vitreous degeneration, bilateral: Secondary | ICD-10-CM | POA: Diagnosis not present

## 2015-01-17 DIAGNOSIS — J310 Chronic rhinitis: Secondary | ICD-10-CM | POA: Diagnosis not present

## 2015-01-17 DIAGNOSIS — R490 Dysphonia: Secondary | ICD-10-CM | POA: Diagnosis not present

## 2015-01-17 DIAGNOSIS — H43391 Other vitreous opacities, right eye: Secondary | ICD-10-CM | POA: Diagnosis not present

## 2015-01-17 DIAGNOSIS — H35372 Puckering of macula, left eye: Secondary | ICD-10-CM | POA: Diagnosis not present

## 2015-01-26 ENCOUNTER — Ambulatory Visit: Payer: Medicare Other | Admitting: Podiatry

## 2015-02-01 ENCOUNTER — Other Ambulatory Visit: Payer: Self-pay | Admitting: Family Medicine

## 2015-02-02 DIAGNOSIS — J328 Other chronic sinusitis: Secondary | ICD-10-CM | POA: Diagnosis not present

## 2015-02-02 DIAGNOSIS — J310 Chronic rhinitis: Secondary | ICD-10-CM | POA: Diagnosis not present

## 2015-02-07 ENCOUNTER — Ambulatory Visit (INDEPENDENT_AMBULATORY_CARE_PROVIDER_SITE_OTHER): Payer: Medicare Other | Admitting: Podiatry

## 2015-02-07 ENCOUNTER — Encounter: Payer: Self-pay | Admitting: Podiatry

## 2015-02-07 VITALS — BP 147/80 | HR 79 | Resp 15

## 2015-02-07 DIAGNOSIS — Z79899 Other long term (current) drug therapy: Secondary | ICD-10-CM | POA: Diagnosis not present

## 2015-02-07 DIAGNOSIS — L603 Nail dystrophy: Secondary | ICD-10-CM

## 2015-02-07 MED ORDER — TERBINAFINE HCL 250 MG PO TABS: 250.0000 mg | ORAL_TABLET | Freq: Every day | ORAL | Status: DC

## 2015-02-07 NOTE — Progress Notes (Signed)
Patient presents today after having taken Lamisil for 30 days for onychomycosis. Patient denies fever chills nausea vomiting muscle aches pains rashes or itching.   Objective: No changes in the nail plates as of yet.  Assessment: Onychomycosis bilateral.  Plan: At this point we will start her on a pulse dose for the next 3 or 4 months. She will take 1 pill a day for 7 days once a month and then follow up with me in 4 months.

## 2015-02-08 ENCOUNTER — Telehealth: Payer: Self-pay | Admitting: *Deleted

## 2015-02-08 NOTE — Telephone Encounter (Signed)
Pt states Dr.Hyatt prescribed Terbinafine and wanted her to take 1 pill daily for 7 days once a month until completion.  I reviewed pt's 02/07/2015 visit note and Dr. Milinda Pointer did want pt to take 1 pill daily x 7days, off 3 weeks then repeat the cycle until done and follow up in 4 months.  I informed pt and she states understanding.

## 2015-03-05 ENCOUNTER — Emergency Department (HOSPITAL_COMMUNITY): Payer: Medicare Other

## 2015-03-05 ENCOUNTER — Emergency Department (HOSPITAL_COMMUNITY)
Admission: EM | Admit: 2015-03-05 | Discharge: 2015-03-06 | Disposition: A | Discharge status: Home / Self Care | Payer: Medicare Other | Attending: Emergency Medicine | Admitting: Emergency Medicine

## 2015-03-05 ENCOUNTER — Encounter (HOSPITAL_COMMUNITY): Payer: Self-pay | Admitting: Nurse Practitioner

## 2015-03-05 DIAGNOSIS — K219 Gastro-esophageal reflux disease without esophagitis: Secondary | ICD-10-CM | POA: Diagnosis not present

## 2015-03-05 DIAGNOSIS — Z872 Personal history of diseases of the skin and subcutaneous tissue: Secondary | ICD-10-CM | POA: Diagnosis not present

## 2015-03-05 DIAGNOSIS — Z79899 Other long term (current) drug therapy: Secondary | ICD-10-CM | POA: Insufficient documentation

## 2015-03-05 DIAGNOSIS — Z880 Allergy status to penicillin: Secondary | ICD-10-CM | POA: Diagnosis not present

## 2015-03-05 DIAGNOSIS — I10 Essential (primary) hypertension: Secondary | ICD-10-CM | POA: Insufficient documentation

## 2015-03-05 DIAGNOSIS — Z87442 Personal history of urinary calculi: Secondary | ICD-10-CM | POA: Diagnosis not present

## 2015-03-05 DIAGNOSIS — F419 Anxiety disorder, unspecified: Secondary | ICD-10-CM | POA: Diagnosis not present

## 2015-03-05 DIAGNOSIS — R059 Cough, unspecified: Secondary | ICD-10-CM

## 2015-03-05 DIAGNOSIS — R05 Cough: Secondary | ICD-10-CM | POA: Insufficient documentation

## 2015-03-05 DIAGNOSIS — Z8619 Personal history of other infectious and parasitic diseases: Secondary | ICD-10-CM | POA: Insufficient documentation

## 2015-03-05 LAB — BASIC METABOLIC PANEL
ANION GAP: 12 (ref 5–15)
BUN: 6 mg/dL (ref 6–20)
CALCIUM: 9.4 mg/dL (ref 8.9–10.3)
CO2: 31 mmol/L (ref 22–32)
CREATININE: 0.61 mg/dL (ref 0.44–1.00)
Chloride: 94 mmol/L — ABNORMAL LOW (ref 101–111)
GFR calc Af Amer: >60 mL/min (ref >60)
GFR calc non Af Amer: >60 mL/min (ref >60)
GLUCOSE: 111 mg/dL — AB (ref 65–99)
Potassium: 3.1 mmol/L — ABNORMAL LOW (ref 3.5–5.1)
Sodium: 137 mmol/L (ref 135–145)

## 2015-03-05 LAB — CBC WITH DIFFERENTIAL/PLATELET
BASOS PCT: 0 % (ref 0–1)
Basophils Absolute: 0 10*3/uL (ref 0.0–0.1)
EOS PCT: 2 % (ref 0–5)
Eosinophils Absolute: 0.2 10*3/uL (ref 0.0–0.7)
HEMATOCRIT: 42.1 % (ref 36.0–46.0)
Hemoglobin: 14 g/dL (ref 12.0–15.0)
Lymphocytes Relative: 13 % (ref 12–46)
Lymphs Abs: 1.2 10*3/uL (ref 0.7–4.0)
MCH: 30 pg (ref 26.0–34.0)
MCHC: 33.3 g/dL (ref 30.0–36.0)
MCV: 90.1 fL (ref 78.0–100.0)
MONO ABS: 0.9 10*3/uL (ref 0.1–1.0)
MONOS PCT: 10 % (ref 3–12)
NEUTROS ABS: 7 10*3/uL (ref 1.7–7.7)
Neutrophils Relative %: 75 % (ref 43–77)
PLATELETS: 235 10*3/uL (ref 150–400)
RBC: 4.67 MIL/uL (ref 3.87–5.11)
RDW: 14 % (ref 11.5–15.5)
WBC: 9.3 10*3/uL (ref 4.0–10.5)

## 2015-03-05 MED ORDER — BENZONATATE 100 MG PO CAPS
100.0000 mg | ORAL_CAPSULE | Freq: Once | ORAL | Status: AC
  Administered 2015-03-05: 100 mg via ORAL
  Filled 2015-03-05: qty 1

## 2015-03-05 MED ORDER — DEXAMETHASONE SODIUM PHOSPHATE 10 MG/ML IJ SOLN: 10.0000 mg | Freq: Once | INTRAMUSCULAR | Status: DC

## 2015-03-05 MED ORDER — BENZONATATE 100 MG PO CAPS: 100.0000 mg | ORAL_CAPSULE | Freq: Three times a day (TID) | ORAL | Status: DC

## 2015-03-05 MED ORDER — DEXAMETHASONE 1 MG/ML PO CONC
10.0000 mg | Freq: Once | ORAL | Status: AC
  Administered 2015-03-06: 10 mg via ORAL
  Filled 2015-03-05: qty 10

## 2015-03-05 MED ORDER — SODIUM CHLORIDE 0.9 % IV BOLUS (SEPSIS): 500.0000 mL | Freq: Once | INTRAVENOUS | Status: DC

## 2015-03-05 MED ORDER — PREDNISONE 10 MG PO TABS: 40.0000 mg | ORAL_TABLET | Freq: Every day | ORAL | Status: DC

## 2015-03-05 NOTE — ED Notes (Signed)
Pt given sprite to drink. 

## 2015-03-05 NOTE — ED Notes (Signed)
She c/o productive cough and chills progressively worse over past week. She saw her PCP Tuesday and he put her on clarithromycin but symptoms worsened. She denies pain, sob. She states she became afraid today because the mucous is so thick while she is coughing it makes her feel she is going to choke.

## 2015-03-05 NOTE — ED Provider Notes (Signed)
CSN: [id number]     Arrival date & time 03/05/15  1559 History   First MD Initiated Contact with Patient 03/05/15 1934     Chief Complaint  Patient presents with  . Cough     Patient is a 69 y.o. female presenting with cough. The history is provided by the patient. No language interpreter was used.  Cough  [name] presents for evaluation of cough.   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 She presents for 3 wks of URI/cough sxs and sensation of gagging.  Sxs are gradually worsening.  She was seen by her ENT and was started on clarithromycin five days ago and sxs are worsening.  No chest pain.   She reports that she is having some nasal congestion and producing thick mucus. She denies any shortness of breath or leg swelling. No fevers. She does feel like she has a low-grade elevated temperature. She also reports being hoarse for the last 5 days. No sensation of throat swelling.  Past Medical History  Diagnosis Date  . Diverticulosis of colon (without mention of hemorrhage)   . GERD (gastroesophageal reflux disease)   . Other and unspecified hyperlipidemia   . Post-nasal drip 04/10/2012  . Hypertension     under control, has been on med. x 7 yrs.  . 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 Palpitations     "all my life" - states no problems; states had stress test 2006; no cardiologist  . Complication of anesthesia     states had flashing lights x 6 wks. post-op in 1992  . Dental crowns present   . History of kidney stones   . Anxiety   . Difficulty swallowing solids     states difficulty swallowing dry foods  . Rash     breasts  . Eczema   . 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 Hiatal hernia     states "small"  . H. pylori infection    Past Surgical History  Procedure Laterality Date  . Transphenoidal / transnasal  hypophysectomy / resection pituitary tumor  1983  . Cesarean section    . Abdominal hysterectomy  1993    complete  . Oophorectomy  1992    x 1  . Cataract extraction, bilateral  2007  . Orif ulnar fracture  01/16/2000    left: also radial head dislocation  . Larynx surgery  11/2011    vocal cord biopsy  . Esophagoscopy  04/14/2012    Procedure: ESOPHAGOSCOPY;  Surgeon: Rozetta Nunnery, MD;  Location: [facility];  Service: ENT;  Laterality: Right;  . Nasal hemorrhage control  04/14/2012    Procedure: EPISTAXIS CONTROL;  Surgeon: Rozetta Nunnery, MD;  Location: [facility];  Service: ENT;  Laterality: Right;  Cauterization of Right Septal Vessel  . Colonoscopy  02/10/2013  . Esophagogastroduodenoscopy  02/10/2013   Family History  Problem Relation Age of Onset  . Coronary artery disease Mother   . Heart attack Mother   . Breast cancer Mother   . Colon cancer Mother   . Hypertension Father   . Thrombocytopenia Father   . Other Father     ITP  . Prostate cancer Brother    Social History  Substance Use Topics  . Smoking status: Never Smoker   .  Smokeless tobacco: Never Used  . Alcohol Use: No   OB History    No data available     Review of Systems  Respiratory: Positive for cough.   All other systems reviewed and are negative.     Allergies  Penicillins; Adhesive; and Codeine  Home Medications   Prior to Admission medications   Medication Sig Start Date End Date Taking? Authorizing Provider  ALPRAZolam (XANAX) 0.25 MG tablet Take 1 tablet (0.25 mg total) by mouth at bedtime as needed for sleep. 10/27/14   Olga Millers, MD  calcium carbonate (TUMS CALCIUM FOR LIFE BONE) 750 MG chewable tablet Chew 2-4 tablets by mouth daily.      Historical Provider, MD  cholecalciferol (VITAMIN D) 1000 UNITS tablet Take 1,000 Units by mouth daily.      Historical Provider, MD  DYMISTA 137-50 MCG/ACT SUSP INSERT 2 SPRAYS IN EACH NOSTRIL EVERY DAY  02/02/15   Historical Provider, MD  fluticasone (FLONASE) 50 MCG/ACT nasal spray Place 2 sprays into both nostrils daily. 05/11/14   Midge Minium, MD  hydrochlorothiazide (HYDRODIURIL) 25 MG tablet TAKE 1 TABLET BY MOUTH EVERY DAY 02/02/15   Olga Millers, MD  hyoscyamine (LEVSIN SL) 0.125 MG SL tablet Place 1 tablet (0.125 mg total) under the tongue every 4 (four) hours as needed for cramping. 11/21/14   Jerene Bears, MD  rosuvastatin (CRESTOR) 10 MG tablet Take 1 tablet (10 mg total) by mouth daily. 12/21/14   Olga Millers, MD  terbinafine (LAMISIL) 250 MG tablet Take 1 tablet (250 mg total) by mouth daily. 02/07/15   Max T Hyatt, DPM   BP 151/69 mmHg  Pulse 98  Temp(Src) 99.6 F (37.6 C) (Oral)  Resp 16  SpO2 95% Physical Exam  Constitutional: She is oriented to person, place, and time. She appears well-developed and well-nourished.  HENT:  Head: Normocephalic and atraumatic.  Mouth/Throat: Oropharynx is clear and moist.  Neck: No JVD present.  Cardiovascular: Normal rate and regular rhythm.   SEM  Pulmonary/Chest: Effort normal and breath sounds normal. No respiratory distress.   No stridor  Abdominal: Soft. There is no tenderness. There is no rebound and no guarding.  Musculoskeletal: She exhibits no edema or tenderness.  Neurological: She is alert and oriented to person, place, and time.  Skin: Skin is warm and dry.  Psychiatric: She has a normal mood and affect. Her behavior is normal.  Nursing note and vitals reviewed.   ED Course  Procedures (including critical care time) Labs Review Labs Reviewed  BASIC METABOLIC PANEL - Abnormal; Notable for the following:    Potassium 3.1 (*)    Chloride 94 (*)    Glucose, Bld 111 (*)    All other components within normal limits  CBC WITH DIFFERENTIAL/PLATELET    Imaging Review Dg Chest 2 View  03/05/2015   CLINICAL DATA:  Productive cough and chills worse over the past week.  EXAM: CHEST  2 VIEW  COMPARISON:  None.   FINDINGS: Lungs are adequately inflated without consolidation or effusion. Cardiomediastinal silhouette is within normal. There is mild calcified plaque over the aortic arch. There are mild degenerative changes of the spine.  IMPRESSION: No active cardiopulmonary disease.   Electronically Signed   By: Marin Olp M.D.   On: 03/05/2015 16:57   I have personally reviewed and evaluated these images and lab results as part of my medical decision-making.   EKG Interpretation   Date/Time:  Sunday March 05 2015  71:21:97 EDT Ventricular Rate:  96 PR Interval:  201 QRS Duration: 88 QT Interval:  362 QTC Calculation: 457 R Axis:   41 Text Interpretation:  Sinus rhythm Confirmed by Hazle Coca [id number] on  03/05/2015 8:47:15 PM      MDM   Final diagnoses:  Cough    patient here for evaluation of cough , has laryngeal amyloid. There is no stridor or respiratory distress on exam. Patient is hoarse but is able to phonate. Patient is nontoxic appearing. Examination and chest x-ray not consistent with pneumonia or CHF. Suspect the patient has a URI with some postnasal drip and potentially some irritation from postnasal drip and coughing, treating with steroids. Do not feel patient needs additional antibiotics at this time. Vital signs reviewed and patient had no  Hypoxia in the emergency department, the  sPO2 of 89% is thought to be documented in error. Discussed close ENT follow-up as well as very close return precautions.   Quintella Reichert, MD 03/06/15 [phone]

## 2015-03-05 NOTE — Discharge Instructions (Signed)
Cough, Adult  A cough is a reflex that helps clear your throat and airways. It can help heal the body or may be a reaction to an irritated airway. A cough may only last 2 or 3 weeks (acute) or may last more than 8 weeks (chronic).  CAUSES Acute cough:  Viral or bacterial infections. Chronic cough:  Infections.  Allergies.  Asthma.  Post-nasal drip.  Smoking.  Heartburn or acid reflux.  Some medicines.  Chronic lung problems (COPD).  Cancer. SYMPTOMS   Cough.  Fever.  Chest pain.  Increased breathing rate.  High-pitched whistling sound when breathing (wheezing).  Colored mucus that you cough up (sputum). TREATMENT   A bacterial cough may be treated with antibiotic medicine.  A viral cough must run its course and will not respond to antibiotics.  Your caregiver may recommend other treatments if you have a chronic cough. HOME CARE INSTRUCTIONS   Only take over-the-counter or prescription medicines for pain, discomfort, or fever as directed by your caregiver. Use cough suppressants only as directed by your caregiver.  Use a cold steam vaporizer or humidifier in your bedroom or home to help loosen secretions.  Sleep in a semi-upright position if your cough is worse at night.  Rest as needed.  Stop smoking if you smoke. SEEK IMMEDIATE MEDICAL CARE IF:   You have pus in your sputum.  Your cough starts to worsen.  You cannot control your cough with suppressants and are losing sleep.  You begin coughing up blood.  You have difficulty breathing.  You develop pain which is getting worse or is uncontrolled with medicine.  You have a fever. MAKE SURE YOU:   Understand these instructions.  Will watch your condition.  Will get help right away if you are not doing well or get worse. Document Released: 12/21/2010 Document Revised: 09/16/2011 Document Reviewed: 12/21/2010 [employer] Patient Information 2015 [employer]. This information is not intended  to replace advice given to you by your health care provider. Make sure you discuss any questions you have with your health care provider.  

## 2015-03-05 NOTE — ED Notes (Signed)
MD at bedside. 

## 2015-03-06 DIAGNOSIS — R05 Cough: Secondary | ICD-10-CM | POA: Diagnosis not present

## 2015-03-06 NOTE — ED Notes (Signed)
Patient left at this time with all belongings.Pt left clear cup in room, labeled and placed in lost and found.

## 2015-03-07 ENCOUNTER — Other Ambulatory Visit: Payer: Self-pay | Admitting: Otolaryngology

## 2015-03-07 DIAGNOSIS — Z8709 Personal history of other diseases of the respiratory system: Secondary | ICD-10-CM

## 2015-03-07 DIAGNOSIS — Z86018 Personal history of other benign neoplasm: Secondary | ICD-10-CM

## 2015-03-07 DIAGNOSIS — R490 Dysphonia: Secondary | ICD-10-CM

## 2015-03-10 ENCOUNTER — Ambulatory Visit
Admission: RE | Admit: 2015-03-10 | Discharge: 2015-03-10 | Disposition: A | Discharge status: Home / Self Care | Payer: Medicare Other | Source: Ambulatory Visit | Attending: Otolaryngology | Admitting: Otolaryngology

## 2015-03-10 DIAGNOSIS — R490 Dysphonia: Secondary | ICD-10-CM

## 2015-03-10 DIAGNOSIS — Z8709 Personal history of other diseases of the respiratory system: Secondary | ICD-10-CM

## 2015-03-10 DIAGNOSIS — R221 Localized swelling, mass and lump, neck: Secondary | ICD-10-CM | POA: Diagnosis not present

## 2015-03-10 DIAGNOSIS — Z86018 Personal history of other benign neoplasm: Secondary | ICD-10-CM

## 2015-03-10 MED ORDER — IOPAMIDOL (ISOVUE-300) INJECTION 61%
75.0000 mL | Freq: Once | INTRAVENOUS | Status: AC | PRN
  Administered 2015-03-10: 75 mL via INTRAVENOUS

## 2015-03-14 ENCOUNTER — Other Ambulatory Visit: Payer: Medicare Other

## 2015-03-15 DIAGNOSIS — R490 Dysphonia: Secondary | ICD-10-CM | POA: Diagnosis not present

## 2015-03-22 ENCOUNTER — Ambulatory Visit: Payer: Medicare Other | Admitting: Family

## 2015-03-27 ENCOUNTER — Encounter: Payer: Self-pay | Admitting: Internal Medicine

## 2015-03-27 ENCOUNTER — Ambulatory Visit (INDEPENDENT_AMBULATORY_CARE_PROVIDER_SITE_OTHER): Payer: Medicare Other | Admitting: Internal Medicine

## 2015-03-27 VITALS — BP 136/76 | HR 96 | Temp 98.4°F | Resp 16 | Ht 63.5 in | Wt 189.1 lb

## 2015-03-27 DIAGNOSIS — J3089 Other allergic rhinitis: Secondary | ICD-10-CM | POA: Diagnosis not present

## 2015-03-27 DIAGNOSIS — E859 Amyloidosis, unspecified: Secondary | ICD-10-CM | POA: Diagnosis not present

## 2015-03-27 NOTE — Patient Instructions (Signed)
We will have you stop taking the clarithromycin. Start back taking the allegra and see if that helps more.   You can try taking an aleve to see if this helps some with the swelling in the throat.

## 2015-03-27 NOTE — Progress Notes (Signed)
Pre visit review using our clinic review tool, if applicable. No additional management support is needed unless otherwise documented below in the visit note. 

## 2015-03-28 NOTE — Assessment & Plan Note (Signed)
Evaluation with ENT determined that excision is not needed at this time and this is likely contributing to her symptoms but not the only cause of the symptoms.

## 2015-03-28 NOTE — Progress Notes (Signed)
   Subjective:    Patient ID: Katie Stout, female    DOB: June 23, 1946, 69 y.o.   MRN: [id number]  HPI The patient is a 69 YO female coming in for follow up of a sinus problem. She was treated for sinus infection end of August and did 2 weeks of antibiotics. This alleviated the SOB and feeling of choking and being unable to breathe. 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 They did not think that was the cause. She then was given another 2 weeks of antibiotics and advised to see her PCP. She comes in today and has not been taking the antibiotics since they are upsetting her stomach. She denies fevers or chills. Still having some nasal drainage and mild cough. Still some congestion in her throat. No SOB or chest pains. Is taking flonse but stopping her allegra while sick. Did also try some mucinex at one point which was helpful.   Review of Systems  Constitutional: Negative for fever, chills, activity change, appetite change, fatigue and unexpected weight change.  HENT: Positive for congestion and postnasal drip. Negative for dental problem, drooling, ear discharge, ear pain, nosebleeds, rhinorrhea, sinus pressure, sore throat and trouble swallowing.   Eyes: Negative.   Respiratory: Negative for cough, chest tightness, shortness of breath and wheezing.   Cardiovascular: Negative for chest pain, palpitations and leg swelling.  Gastrointestinal: Positive for nausea. Negative for vomiting, abdominal pain, diarrhea and abdominal distention.       With the antibiotics  Psychiatric/Behavioral: Negative.       Objective:   Physical Exam  Constitutional: She is oriented to person, place, and time. She appears well-developed and well-nourished.  HENT:  Head: Normocephalic and atraumatic.  Right Ear: External ear normal.  Left Ear: External ear normal.  Tonsils visualized and not infected  appearing, some redness in the nasal turbinates but no crusting, no facial tenderness.   Eyes: EOM are normal.  Neck: Normal range of motion.  Cardiovascular: Normal rate and regular rhythm.   Pulmonary/Chest: Effort normal and breath sounds normal.  Abdominal: Soft.  Neurological: She is alert and oriented to person, place, and time. Coordination normal.  Skin: Skin is warm and dry.  Psychiatric: She has a normal mood and affect.   Filed Vitals:   03/27/15 1517  BP: 136/76  Pulse: 96  Temp: 98.4 F (36.9 C)  TempSrc: Oral  Resp: 16  Height: 5' 3.5" (1.613 m)  Weight: 189 lb 1.9 oz (85.784 kg)  SpO2: 97%      Assessment & Plan:

## 2015-03-28 NOTE — Assessment & Plan Note (Signed)
Advised her to start taking her allegra again and to continue the flonase. No indication for antibiotics at this time. Advised her to stop taking the clarithromycin and not continue. Lungs clear on exam and sinuses with mild congestion but no signs of infection.

## 2015-04-24 ENCOUNTER — Ambulatory Visit: Payer: Medicare Other | Admitting: Internal Medicine

## 2015-05-01 ENCOUNTER — Encounter: Payer: Self-pay | Admitting: Internal Medicine

## 2015-05-01 ENCOUNTER — Ambulatory Visit (INDEPENDENT_AMBULATORY_CARE_PROVIDER_SITE_OTHER): Payer: Medicare Other | Admitting: Internal Medicine

## 2015-05-01 ENCOUNTER — Other Ambulatory Visit (INDEPENDENT_AMBULATORY_CARE_PROVIDER_SITE_OTHER): Payer: Medicare Other

## 2015-05-01 VITALS — BP 144/78 | HR 104 | Temp 98.5°F | Resp 16 | Ht 63.5 in | Wt 189.0 lb

## 2015-05-01 DIAGNOSIS — E785 Hyperlipidemia, unspecified: Secondary | ICD-10-CM

## 2015-05-01 DIAGNOSIS — Z23 Encounter for immunization: Secondary | ICD-10-CM | POA: Diagnosis not present

## 2015-05-01 DIAGNOSIS — R5383 Other fatigue: Secondary | ICD-10-CM | POA: Diagnosis not present

## 2015-05-01 DIAGNOSIS — I10 Essential (primary) hypertension: Secondary | ICD-10-CM

## 2015-05-01 DIAGNOSIS — Z1231 Encounter for screening mammogram for malignant neoplasm of breast: Secondary | ICD-10-CM | POA: Diagnosis not present

## 2015-05-01 DIAGNOSIS — Z0000 Encounter for general adult medical examination without abnormal findings: Secondary | ICD-10-CM | POA: Diagnosis not present

## 2015-05-01 DIAGNOSIS — J3089 Other allergic rhinitis: Secondary | ICD-10-CM

## 2015-05-01 LAB — COMPREHENSIVE METABOLIC PANEL
ALBUMIN: 4.3 g/dL (ref 3.5–5.2)
ALT: 35 U/L (ref 0–35)
AST: 25 U/L (ref 0–37)
Alkaline Phosphatase: 53 U/L (ref 39–117)
BILIRUBIN TOTAL: 2.2 mg/dL — AB (ref 0.2–1.2)
BUN: 9 mg/dL (ref 6–23)
CALCIUM: 9.9 mg/dL (ref 8.4–10.5)
CHLORIDE: 99 meq/L (ref 96–112)
CO2: 34 mEq/L — ABNORMAL HIGH (ref 19–32)
CREATININE: 0.59 mg/dL (ref 0.40–1.20)
GFR: 107.12 mL/min (ref >60.00)
Glucose, Bld: 95 mg/dL (ref 70–99)
Potassium: 3.7 mEq/L (ref 3.5–5.1)
Sodium: 141 mEq/L (ref 135–145)
Total Protein: 7.1 g/dL (ref 6.0–8.3)

## 2015-05-01 LAB — T4, FREE: Free T4: 0.74 ng/dL (ref 0.60–1.60)

## 2015-05-01 LAB — TSH: TSH: 3.86 u[IU]/mL (ref 0.35–4.50)

## 2015-05-01 LAB — LIPID PANEL
Cholesterol: 165 mg/dL (ref 0–200)
HDL: 58.8 mg/dL (ref >39.00)
LDL Cholesterol: 83 mg/dL (ref 0–99)
NONHDL: 106.51
Total CHOL/HDL Ratio: 3
Triglycerides: 116 mg/dL (ref 0.0–149.0)
VLDL: 23.2 mg/dL (ref 0.0–40.0)

## 2015-05-01 NOTE — Assessment & Plan Note (Signed)
Under better control today, mild morning congestion which she is able to clear. We talked about mucinex if needed without the d part.

## 2015-05-01 NOTE — Progress Notes (Signed)
   Subjective:    Patient ID: Katie Stout, female    DOB: 1945-08-11, 69 y.o.   MRN: [id number]  HPI The patient is a 69 YO female with several concerns. She is first having still some drainage. Her cough is much improved from last visit with the changes we made. No fevers or chills. No nasal congestion or drainage. Some mild congestion in her throat and she is able to clear it.  She is also concerned about her cholesterol and has started crestor since last lipid panel. No side effects. She is worried it is not working well. No new chest pains or weakness or numbness.   Review of Systems  Constitutional: Negative for fever, chills, activity change, appetite change, fatigue and unexpected weight change.  HENT: Positive for congestion and voice change. Negative for dental problem, drooling, ear discharge, ear pain, nosebleeds, postnasal drip, rhinorrhea, sinus pressure, sore throat and trouble swallowing.        Chronic voice change  Eyes: Negative.   Respiratory: Positive for cough. Negative for chest tightness, shortness of breath and wheezing.   Cardiovascular: Negative for chest pain, palpitations and leg swelling.  Gastrointestinal: Negative for nausea, vomiting, abdominal pain, diarrhea and abdominal distention.       With the antibiotics  Musculoskeletal: Negative.   Neurological: Negative.   Psychiatric/Behavioral: Negative.       Objective:   Physical Exam  Constitutional: She is oriented to person, place, and time. She appears well-developed and well-nourished.  HENT:  Head: Normocephalic and atraumatic.  Right Ear: External ear normal.  Left Ear: External ear normal.  Oropharynx normal, turbinates without crusting.  Eyes: EOM are normal.  Neck: Normal range of motion.  Cardiovascular: Normal rate and regular rhythm.   Pulmonary/Chest: Effort normal and breath sounds normal.  Abdominal: Soft.  Neurological: She is alert and oriented to person, place, and time.  Coordination normal.  Skin: Skin is warm and dry.  Psychiatric: She has a normal mood and affect.   Filed Vitals:   05/01/15 1105  BP: 144/78  Pulse: 104  Temp: 98.5 F (36.9 C)  TempSrc: Oral  Resp: 16  Height: 5' 3.5" (1.613 m)  Weight: 189 lb (85.73 kg)  SpO2: 96%      Assessment & Plan:  Flu shot given at visit.

## 2015-05-01 NOTE — Assessment & Plan Note (Signed)
Changed to crestor and rechecking lipid panel today. Adjust as needed. No side effects.

## 2015-05-01 NOTE — Assessment & Plan Note (Signed)
BP mildly elevated today on hctz alone. Will continue to monitor as last several BP are at goal and she is dreading her mammogram later today could be artificially elevating her blood pressure.

## 2015-05-01 NOTE — Progress Notes (Signed)
Pre visit review using our clinic review tool, if applicable. No additional management support is needed unless otherwise documented below in the visit note. 

## 2015-05-01 NOTE — Patient Instructions (Signed)
We are checking the labs today and will call you back with the results.   We have given you the flu shot today.  We will recheck the ultrasound of the thyroid in about 12 months.

## 2015-05-02 LAB — HEPATITIS C ANTIBODY: HCV Ab: NEGATIVE

## 2015-05-05 DIAGNOSIS — Z1231 Encounter for screening mammogram for malignant neoplasm of breast: Secondary | ICD-10-CM | POA: Diagnosis not present

## 2015-05-05 DIAGNOSIS — N63 Unspecified lump in breast: Secondary | ICD-10-CM | POA: Diagnosis not present

## 2015-05-16 ENCOUNTER — Ambulatory Visit: Payer: Medicare Other | Admitting: Podiatry

## 2015-05-18 ENCOUNTER — Encounter: Payer: Self-pay | Admitting: Internal Medicine

## 2015-05-24 ENCOUNTER — Encounter: Payer: Self-pay | Admitting: Family Medicine

## 2015-07-06 ENCOUNTER — Encounter: Payer: Self-pay | Admitting: *Deleted

## 2015-08-03 ENCOUNTER — Other Ambulatory Visit: Payer: Self-pay | Admitting: Internal Medicine

## 2015-08-14 DIAGNOSIS — L089 Local infection of the skin and subcutaneous tissue, unspecified: Secondary | ICD-10-CM | POA: Diagnosis not present

## 2015-08-14 DIAGNOSIS — N200 Calculus of kidney: Secondary | ICD-10-CM | POA: Diagnosis not present

## 2015-08-14 DIAGNOSIS — Z0000 Encounter for general adult medical examination without abnormal findings: Secondary | ICD-10-CM | POA: Diagnosis not present

## 2015-08-14 DIAGNOSIS — L723 Sebaceous cyst: Secondary | ICD-10-CM | POA: Diagnosis not present

## 2015-08-31 DIAGNOSIS — L723 Sebaceous cyst: Secondary | ICD-10-CM | POA: Diagnosis not present

## 2015-09-12 DIAGNOSIS — R490 Dysphonia: Secondary | ICD-10-CM | POA: Diagnosis not present

## 2015-10-05 DIAGNOSIS — G243 Spasmodic torticollis: Secondary | ICD-10-CM | POA: Diagnosis not present

## 2015-10-23 ENCOUNTER — Ambulatory Visit (INDEPENDENT_AMBULATORY_CARE_PROVIDER_SITE_OTHER): Payer: Medicare Other | Admitting: Internal Medicine

## 2015-10-23 ENCOUNTER — Encounter: Payer: Self-pay | Admitting: Internal Medicine

## 2015-10-23 VITALS — BP 162/70 | HR 107 | Temp 99.0°F | Resp 18 | Ht 63.5 in | Wt 189.0 lb

## 2015-10-23 DIAGNOSIS — L0291 Cutaneous abscess, unspecified: Secondary | ICD-10-CM

## 2015-10-23 DIAGNOSIS — L02219 Cutaneous abscess of trunk, unspecified: Secondary | ICD-10-CM | POA: Insufficient documentation

## 2015-10-23 DIAGNOSIS — L03319 Cellulitis of trunk, unspecified: Secondary | ICD-10-CM

## 2015-10-23 MED ORDER — SULFAMETHOXAZOLE-TRIMETHOPRIM 800-160 MG PO TABS: 1.0000 | ORAL_TABLET | Freq: Two times a day (BID) | ORAL | Status: DC

## 2015-10-23 MED ORDER — ALPRAZOLAM 0.25 MG PO TABS: 0.2500 mg | ORAL_TABLET | Freq: Every evening | ORAL | Status: DC | PRN

## 2015-10-23 NOTE — Progress Notes (Signed)
Pre visit review using our clinic review tool, if applicable. No additional management support is needed unless otherwise documented below in the visit note. 

## 2015-10-23 NOTE — Patient Instructions (Signed)
We have sent in bactrim for the spot, take 1 pill twice a day for 1 week. Give [place] a call and let us know if it is better (you can also send a message on mychart).  Come back in about 1-2 months to check on the cholesterol.

## 2015-10-23 NOTE — Assessment & Plan Note (Signed)
Rx for bactrim for 1 week, not drainable in office today. No fevers or chills and no cellulitic changes. Has visit with her surgeon in about 1 month to follow on another drainage.

## 2015-10-23 NOTE — Progress Notes (Signed)
   Subjective:    Patient ID: Katie Stout, female    DOB: [birth year], 70 y.o.   MRN: [id number]  HPI The patient is a 70 YO female coming in for abscess below her left breast. She noticed it about 1-2 weeks ago and it started draining some pus 1-2 days ago. Painful and wearing a bra irritates the area. She has large breasts and so needs a wire bra to help with support. No fevers or chills and no rash around the area. Has been using neosporin and covering with bandage.   Review of Systems  Constitutional: Negative for fever, activity change, appetite change, fatigue and unexpected weight change.  Respiratory: Negative.   Cardiovascular: Negative for chest pain, palpitations and leg swelling.  Gastrointestinal: Negative.   Musculoskeletal: Positive for myalgias. Negative for back pain and arthralgias.  Skin: Positive for wound. Negative for color change, pallor and rash.  Neurological: Negative.       Objective:   Physical Exam  Constitutional: She appears well-developed and well-nourished.  HENT:  Head: Normocephalic and atraumatic.  Cardiovascular: Normal rate and regular rhythm.   Pulmonary/Chest: Effort normal and breath sounds normal.  Abdominal: Soft. Bowel sounds are normal.  Musculoskeletal: She exhibits no edema.  Neurological: Coordination normal.  Skin:  Small area of wound below the left breast, small amount of pus expressed, no significant fluid collection underneath, some purple of the skin, no redness or cellulitis.    Filed Vitals:   10/23/15 1535  BP: 162/70  Pulse: 107  Temp: 99 F (37.2 C)  TempSrc: Oral  Resp: 18  Height: 5' 3.5" (1.613 m)  Weight: 189 lb (85.73 kg)  SpO2: 94%      Assessment & Plan:

## 2015-10-24 ENCOUNTER — Telehealth: Payer: Self-pay | Admitting: Internal Medicine

## 2015-10-24 ENCOUNTER — Telehealth: Payer: Self-pay

## 2015-10-24 NOTE — Telephone Encounter (Signed)
[facility]  [name]  DOB: [birth year]    Initial Comment Caller states she rcv'd a vm to the nurse (Amy) advising her to continue the medication, however, she now has a rash from her breasts down to pelvic area.   Nurse Assessment  Nurse: Wynetta Emery, RN, Baker Janus Date/Time Eilene Ghazi Time): 10/24/2015 5:09:19 PM  Confirm and document reason for call. If symptomatic, describe symptoms. You must click the next button to save text entered. ---Dyann Ruddle has a cyst under left breast given sulfa drug-- had 3 urinations with blood in it this am started drinking water and now has rash pimple like from breast to toes - worse in creases rib cage  Has the patient traveled out of the country within the last 30 days? ---No  Does the patient have any new or worsening symptoms? ---Yes  Will a triage be completed? ---Yes  Related visit to physician within the last 2 weeks? ---No  Does the PT have any chronic conditions? (i.e. diabetes, asthma, etc.) ---Unknown  Is this a behavioral health or substance abuse call? ---No     Guidelines    Guideline Title Affirmed Question Affirmed Notes  Rash - Widespread On Drugs Taking new prescription antibiotic (EXCEPTION: finished taking new prescription antibiotic)    Final Disposition User   See Physician within 24 Hours Baldwin, RN, Baker Janus    Comments  NOTE: Dr. Sharlet Salina had no openings for Dyann Ruddle -- she will see Alona Bene, MD 8am for r/o medication reaction to sulfa drug blood in urine x 3 after starting drank water cleared then developed rash.   Referrals  REFERRED TO PCP OFFICE   Disagree/Comply: Comply

## 2015-10-24 NOTE — Telephone Encounter (Signed)
Recommend to continue bactrim and drinking increased fluids and if the blood returns stop bactrim and call the office.

## 2015-10-24 NOTE — Telephone Encounter (Signed)
Patient aware and will call us if she has any more problems.

## 2015-10-24 NOTE — Telephone Encounter (Signed)
Patient called and said that she is having blood in her urine after starting her medication. Please follow up with Patient.

## 2015-10-24 NOTE — Telephone Encounter (Signed)
Patient says since starting Bactrim she has blood in her urine. She recently had some kidney stones and the insert from the pharmacy says that if you have blood in your urine to call your doctor. The blood is bright red. Patient started drinking water and she says the urine is clear again. Please advise, thanks.

## 2015-10-25 ENCOUNTER — Encounter: Payer: Self-pay | Admitting: Internal Medicine

## 2015-10-25 ENCOUNTER — Other Ambulatory Visit (INDEPENDENT_AMBULATORY_CARE_PROVIDER_SITE_OTHER): Payer: Medicare Other

## 2015-10-25 ENCOUNTER — Telehealth: Payer: Self-pay

## 2015-10-25 ENCOUNTER — Ambulatory Visit (INDEPENDENT_AMBULATORY_CARE_PROVIDER_SITE_OTHER): Payer: Medicare Other | Admitting: Internal Medicine

## 2015-10-25 ENCOUNTER — Other Ambulatory Visit: Payer: Medicare Other

## 2015-10-25 VITALS — BP 140/80 | HR 82 | Temp 98.2°F | Resp 16 | Ht 63.5 in | Wt 189.0 lb

## 2015-10-25 DIAGNOSIS — L270 Generalized skin eruption due to drugs and medicaments taken internally: Secondary | ICD-10-CM

## 2015-10-25 DIAGNOSIS — L02219 Cutaneous abscess of trunk, unspecified: Secondary | ICD-10-CM

## 2015-10-25 DIAGNOSIS — T50995A Adverse effect of other drugs, medicaments and biological substances, initial encounter: Secondary | ICD-10-CM | POA: Diagnosis not present

## 2015-10-25 DIAGNOSIS — R310 Gross hematuria: Secondary | ICD-10-CM

## 2015-10-25 DIAGNOSIS — L03319 Cellulitis of trunk, unspecified: Secondary | ICD-10-CM | POA: Diagnosis not present

## 2015-10-25 DIAGNOSIS — I10 Essential (primary) hypertension: Secondary | ICD-10-CM

## 2015-10-25 LAB — COMPREHENSIVE METABOLIC PANEL
ALBUMIN: 4.4 g/dL (ref 3.5–5.2)
ALT: 21 U/L (ref 0–35)
AST: 20 U/L (ref 0–37)
Alkaline Phosphatase: 66 U/L (ref 39–117)
BILIRUBIN TOTAL: 2.2 mg/dL — AB (ref 0.2–1.2)
BUN: 9 mg/dL (ref 6–23)
CALCIUM: 9.8 mg/dL (ref 8.4–10.5)
CHLORIDE: 100 meq/L (ref 96–112)
CO2: 33 mEq/L — ABNORMAL HIGH (ref 19–32)
CREATININE: 0.68 mg/dL (ref 0.40–1.20)
GFR: 90.8 mL/min (ref >60.00)
Glucose, Bld: 110 mg/dL — ABNORMAL HIGH (ref 70–99)
Potassium: 3.8 mEq/L (ref 3.5–5.1)
Sodium: 140 mEq/L (ref 135–145)
Total Protein: 7.5 g/dL (ref 6.0–8.3)

## 2015-10-25 LAB — CBC WITH DIFFERENTIAL/PLATELET
BASOS ABS: 0 10*3/uL (ref 0.0–0.1)
BASOS PCT: 0.3 % (ref 0.0–3.0)
EOS ABS: 0.4 10*3/uL (ref 0.0–0.7)
Eosinophils Relative: 5.3 % — ABNORMAL HIGH (ref 0.0–5.0)
HEMATOCRIT: 42.3 % (ref 36.0–46.0)
HEMOGLOBIN: 14.3 g/dL (ref 12.0–15.0)
LYMPHS PCT: 11.4 % — AB (ref 12.0–46.0)
Lymphs Abs: 0.8 10*3/uL (ref 0.7–4.0)
MCHC: 33.9 g/dL (ref 30.0–36.0)
MCV: 88.4 fl (ref 78.0–100.0)
MONOS PCT: 5.8 % (ref 3.0–12.0)
Monocytes Absolute: 0.4 10*3/uL (ref 0.1–1.0)
Neutro Abs: 5.6 10*3/uL (ref 1.4–7.7)
Neutrophils Relative %: 77.2 % — ABNORMAL HIGH (ref 43.0–77.0)
Platelets: 263 10*3/uL (ref 150.0–400.0)
RBC: 4.79 Mil/uL (ref 3.87–5.11)
RDW: 14.7 % (ref 11.5–15.5)
WBC: 7.3 10*3/uL (ref 4.0–10.5)

## 2015-10-25 LAB — URINALYSIS, ROUTINE W REFLEX MICROSCOPIC
BILIRUBIN URINE: NEGATIVE
KETONES UR: NEGATIVE
LEUKOCYTES UA: NEGATIVE
Nitrite: NEGATIVE
Specific Gravity, Urine: <=1.005 — AB (ref 1.000–1.030)
Total Protein, Urine: NEGATIVE
UROBILINOGEN UA: 0.2 (ref 0.0–1.0)
Urine Glucose: NEGATIVE
WBC UA: NONE SEEN (ref 0–?)
pH: 6 (ref 5.0–8.0)

## 2015-10-25 MED ORDER — HYDROMORPHONE HCL 2 MG PO TABS: 1.0000 mg | ORAL_TABLET | Freq: Four times a day (QID) | ORAL | Status: DC | PRN

## 2015-10-25 MED ORDER — DOXYCYCLINE MONOHYDRATE 100 MG PO CAPS: 100.0000 mg | ORAL_CAPSULE | Freq: Two times a day (BID) | ORAL | Status: DC

## 2015-10-25 MED ORDER — OXYCODONE HCL 5 MG PO TABS: 5.0000 mg | ORAL_TABLET | ORAL | Status: DC | PRN

## 2015-10-25 NOTE — Progress Notes (Signed)
Subjective:  Patient ID: Katie Stout, female    DOB: 09-Jul-1945  Age: 70 y.o. MRN: [id number]  CC: Rash and Back Pain   HPI Katie Stout presents for a ollow-up on cyst just below her left breast. She was seen 2 days ago and it was felt that this cyst could not be drained so she was started on Bactrim. Unfortunately she has developed an asymptomatic rash on her torso from the Bactrim. She also reports that the cyst has started to become more painful and is now draining a thick bloody pus . In addition to that she has had right lower back and right flank pain and has noticed some blood in her urine.  Outpatient Prescriptions Prior to Visit  Medication Sig Dispense Refill  . ALPRAZolam (XANAX) 0.25 MG tablet Take 1 tablet (0.25 mg total) by mouth at bedtime as needed for sleep. 30 tablet 2  . calcium carbonate (TUMS CALCIUM FOR LIFE BONE) 750 MG chewable tablet Chew 2-4 tablets by mouth daily.      . cholecalciferol (VITAMIN D) 1000 UNITS tablet Take 1,000 Units by mouth daily.      . fluticasone (FLONASE) 50 MCG/ACT nasal spray Place 2 sprays into both nostrils daily. 16 g 3  . hydrochlorothiazide (HYDRODIURIL) 25 MG tablet TAKE 1 TABLET EVERY DAY 90 tablet 1  . hyoscyamine (LEVSIN SL) 0.125 MG SL tablet Place 1 tablet (0.125 mg total) under the tongue every 4 (four) hours as needed for cramping. 30 tablet 4  . rosuvastatin (CRESTOR) 10 MG tablet Take 1 tablet (10 mg total) by mouth daily. 90 tablet 3  . sulfamethoxazole-trimethoprim (BACTRIM DS,SEPTRA DS) 800-160 MG tablet Take 1 tablet by mouth 2 (two) times daily. 14 tablet 0   No facility-administered medications prior to visit.    ROS Review of Systems  Constitutional: Negative.  Negative for fever, chills, diaphoresis, activity change, appetite change, fatigue and unexpected weight change.  HENT: Negative.  Negative for sinus pressure and trouble swallowing.   Eyes: Negative.   Respiratory: Negative.  Negative for cough,  choking, chest tightness, shortness of breath and stridor.   Cardiovascular: Negative.  Negative for chest pain, palpitations and leg swelling.  Gastrointestinal: Negative.  Negative for nausea, vomiting, abdominal pain, diarrhea, constipation and blood in stool.  Endocrine: Negative.   Genitourinary: Positive for hematuria and flank pain. Negative for dysuria, urgency, decreased urine volume, difficulty urinating and pelvic pain.  Musculoskeletal: Positive for back pain. Negative for myalgias, joint swelling, arthralgias and neck pain.  Skin: Positive for rash. Negative for color change, pallor and wound.  Allergic/Immunologic: Negative.   Neurological: Negative.   Hematological: Negative.  Negative for adenopathy. Does not bruise/bleed easily.  Psychiatric/Behavioral: Negative.     Objective:  BP 140/80 mmHg  Pulse 82  Temp(Src) 98.2 F (36.8 C) (Oral)  Ht 5' 3.5" (1.613 m)  Wt 189 lb (85.73 kg)  BMI 32.95 kg/m2  SpO2 98%  BP Readings from Last 3 Encounters:  10/26/15 148/80  10/25/15 140/80  10/23/15 162/70    Wt Readings from Last 3 Encounters:  10/26/15 189 lb (85.73 kg)  10/25/15 189 lb (85.73 kg)  10/23/15 189 lb (85.73 kg)    Physical Exam  Constitutional: She is oriented to person, place, and time. She appears well-developed and well-nourished.  Non-toxic appearance. She does not have a sickly appearance. She does not appear ill. No distress.  HENT:  Head: Normocephalic and atraumatic.  Mouth/Throat: Oropharynx is clear and moist. No  oropharyngeal exudate.  Eyes: Conjunctivae are normal. Right eye exhibits no discharge. Left eye exhibits no discharge. No scleral icterus.  Neck: Normal range of motion. Neck supple. No JVD present. No tracheal deviation present. No thyromegaly present.  Cardiovascular: Normal rate, regular rhythm, normal heart sounds and intact distal pulses.  Exam reveals no gallop and no friction rub.   No murmur heard. Pulmonary/Chest: Effort  normal and breath sounds normal. No stridor. No respiratory distress. She has no wheezes. She has no rales. She exhibits no tenderness.  Abdominal: Soft. Normal appearance and bowel sounds are normal. She exhibits no distension and no mass. There is no hepatosplenomegaly. There is no tenderness. There is no rebound, no guarding and no CVA tenderness.    The area of concern was cleaned with Betadine. The area was prepped and draped in sterile fashion. Local anesthesia was obtained with instillation of 2% lidocaine with epi. Appx 3 mL were used. A 6 mm punch incision was made and a deep cavity of thick bloody purulent exudate was identified and disrupted. It was irrigated with hydrogen peroxide and packed with iodoform. There was no significant blood loss and she tolerated procedure well. A culture has been sent. A dressing was applied.  Musculoskeletal: Normal range of motion. She exhibits no edema or tenderness.  Lymphadenopathy:    She has no cervical adenopathy.  Neurological: She is oriented to person, place, and time.  Skin: Skin is warm and dry. Rash noted. No purpura noted. Rash is maculopapular. Rash is not macular, not papular, not nodular, not pustular, not vesicular and not urticarial. She is not diaphoretic. No erythema. No pallor.     There are no targets and no involvement of the mucous membranes or palms or soles.  Psychiatric: She has a normal mood and affect. Her behavior is normal. Judgment and thought content normal.  Vitals reviewed.   Lab Results  Component Value Date   WBC 7.3 10/25/2015   HGB 14.3 10/25/2015   HCT 42.3 10/25/2015   PLT 263.0 10/25/2015   GLUCOSE 110* 10/25/2015   CHOL 165 05/01/2015   TRIG 116.0 05/01/2015   HDL 58.80 05/01/2015   LDLDIRECT 132.2 06/10/2012   LDLCALC 83 05/01/2015   ALT 21 10/25/2015   AST 20 10/25/2015   NA 140 10/25/2015   K 3.8 10/25/2015   CL 100 10/25/2015   CREATININE 0.68 10/25/2015   BUN 9 10/25/2015   CO2 33*  10/25/2015   TSH 3.86 05/01/2015   HGBA1C 5.9 12/26/2006    Ct Soft Tissue Neck W Contrast  03/10/2015  CLINICAL DATA:  Hoarseness. History of benign amyloid laryngeal tumor 2011 EXAM: CT NECK WITH CONTRAST TECHNIQUE: Multidetector CT imaging of the neck was performed using the standard protocol following the bolus administration of intravenous contrast. CONTRAST:  54mL ISOVUE-300 IOPAMIDOL (ISOVUE-300) INJECTION 61% COMPARISON:  None. FINDINGS: Pharynx and larynx: Normal pharynx. Epiglottis normal. Soft tissue mass just above the left vocal cord in the region of the false cord. This is well-circumscribed and appears submucosal and has soft tissue density. The mass measures 12 x 6 mm and does not contain calcification. There is mild mass-effect on the mucosal surface. Right ventricle normal. Vocal cords are normal. Given the history of amyloid other vocal cord, this is consistent with amyloid deposition. Salivary glands: Negative Thyroid: Normal thyroid size.  6 mm left thyroid nodule. Lymph nodes: Negative for lymphadenopathy Vascular: Mild carotid artery calcification in the carotid bifurcation bilaterally. Limited intracranial: Negative Visualized orbits: Bilateral lens  extraction.  No orbital mass. Mastoids and visualized paranasal sinuses: Negative Skeleton: Cervical degenerative change and scoliosis. Moderate spondylosis C5-6 and C6-7. Mild anterior slip C3-4 and C4-5 related to facet degeneration. Prominent facet degeneration on the left at C2-3 and C3-4 and on the right at C4-5. Upper chest: Lung apices are clear IMPRESSION: Soft tissue mass in the false cord on the left. 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 No adenopathy.  Incidental 6 mm left thyroid nodule. Cervical degenerative change. Electronically Signed   By: Franchot Gallo M.D.   On: 03/10/2015 13:47    Assessment & Plan:   Breelan was seen today for rash and back  pain.  Diagnoses and all orders for this visit:  Cellulitis and abscess of trunk- incision and drainage has been performed, culture is sent and pending, I do think this is MRSA so will treat with doxycycline. I've asked her to return in about 2 days to remove the packing. In the meantime will control the pain with hydromorphone as needed. -     CBC with Differential/Platelet; Future -     Wound culture; Future -     doxycycline (MONODOX) 100 MG capsule; Take 1 capsule (100 mg total) by mouth 2 (two) times daily. -     Discontinue: oxyCODONE (OXY IR/ROXICODONE) 5 MG immediate release tablet; Take 1 tablet (5 mg total) by mouth every 4 (four) hours as needed for severe pain. -     HYDROmorphone (DILAUDID) 2 MG tablet; Take 0.5 tablets (1 mg total) by mouth every 6 (six) hours as needed for severe pain.  Allergic drug rash- this is consistent with sulfa allergy, the rash is not very symptomatic and she has no evidence of Stevens-Johnson syndrome's or TEN and so will not treat with steroids or antihistamines. -     Comprehensive metabolic panel; Future -     CBC with Differential/Platelet; Future  Hematuria, gross- she has a history of renal stones and has right flank pain and blood in her urine today, I've asked her to have a CT/renal protocol performed to see if she has another kidney stone. -     Comprehensive metabolic panel; Future -     CBC with Differential/Platelet; Future -     Urinalysis, Routine w reflex microscopic (not at [facility]); Future -     CULTURE, URINE COMPREHENSIVE; Future -     CT RENAL STONE STUDY; Future -     HYDROmorphone (DILAUDID) 2 MG tablet; Take 0.5 tablets (1 mg total) by mouth every 6 (six) hours as needed for severe pain.   I have discontinued Ms. Zollner's sulfamethoxazole-trimethoprim and oxyCODONE. I am also having her start on doxycycline and HYDROmorphone. Additionally, I am having her maintain her calcium carbonate, cholecalciferol, fluticasone, hyoscyamine,  rosuvastatin, hydrochlorothiazide, and ALPRAZolam.  Meds ordered this encounter  Medications  . doxycycline (MONODOX) 100 MG capsule    Sig: Take 1 capsule (100 mg total) by mouth 2 (two) times daily.    Dispense:  20 capsule    Refill:  0  . DISCONTD: oxyCODONE (OXY IR/ROXICODONE) 5 MG immediate release tablet    Sig: Take 1 tablet (5 mg total) by mouth every 4 (four) hours as needed for severe pain.    Dispense:  30 tablet    Refill:  0  . HYDROmorphone (DILAUDID) 2 MG tablet    Sig: Take 0.5 tablets (1 mg total) by mouth every 6 (six) hours  as needed for severe pain.    Dispense:  30 tablet    Refill:  0     Follow-up: Return in about 2 days (around 10/27/2015).  [name], MD

## 2015-10-25 NOTE — Telephone Encounter (Signed)
Pt called to check up on this. 

## 2015-10-25 NOTE — Telephone Encounter (Signed)
Needs to come pick up her prescription for oxycodone. There was a moderate amount of blood in her urine so I have ordered a CT scan to see if she has a kidney stone on the right side.

## 2015-10-25 NOTE — Patient Instructions (Signed)
Incision and Drainage Incision and drainage is a procedure in which a sac-like structure (cystic structure) is opened and drained. The area to be drained usually contains material such as pus, fluid, or blood.  LET YOUR CAREGIVER KNOW ABOUT:   Allergies to medicine.  Medicines taken, including vitamins, herbs, eyedrops, over-the-counter medicines, and creams.  Use of steroids (by mouth or creams).  Previous problems with anesthetics or numbing medicines.  History of bleeding problems or blood clots.  Previous surgery.  Other health problems, including diabetes and kidney problems.  Possibility of pregnancy, if this applies. RISKS AND COMPLICATIONS  Pain.  Bleeding.  Scarring.  Infection. BEFORE THE PROCEDURE  You may need to have an ultrasound or other imaging tests to see how large or deep your cystic structure is. Blood tests may also be used to determine if you have an infection or how severe the infection is. You may need to have a tetanus shot. PROCEDURE  The affected area is cleaned with a cleaning fluid. The cyst area will then be numbed with a medicine (local anesthetic). A small incision will be made in the cystic structure. A syringe or catheter may be used to drain the contents of the cystic structure, or the contents may be squeezed out. The area will then be flushed with a cleansing solution. After cleansing the area, it is often gently packed with a gauze or another wound dressing. Once it is packed, it will be covered with gauze and tape or some other type of wound dressing. AFTER THE PROCEDURE   Often, you will be allowed to go home right after the procedure.  You may be given antibiotic medicine to prevent or heal an infection.  If the area was packed with gauze or some other wound dressing, you will likely need to come back in 1 to 2 days to get it removed.  The area should heal in about 14 days.   This information is not intended to replace advice given  to you by your health care provider. Make sure you discuss any questions you have with your health care provider.   Document Released: 12/18/2000 Document Revised: 12/24/2011 Document Reviewed: 08/19/2011 Elsevier Interactive Patient Education 2016 [employer]  

## 2015-10-25 NOTE — Telephone Encounter (Signed)
changed

## 2015-10-25 NOTE — Telephone Encounter (Signed)
Pt stated that oxycodone make her feels bad, can Dr. Ronnald Ramp give her something else please.

## 2015-10-25 NOTE — Telephone Encounter (Signed)
Patient says after her visit today with the cyst , she is now in a lot of pain. And would like something for pain. Please advise?

## 2015-10-25 NOTE — Progress Notes (Signed)
Pre visit review using our clinic review tool, if applicable. No additional management support is needed unless otherwise documented below in the visit note. 

## 2015-10-26 ENCOUNTER — Ambulatory Visit (INDEPENDENT_AMBULATORY_CARE_PROVIDER_SITE_OTHER): Payer: Medicare Other | Admitting: Internal Medicine

## 2015-10-26 ENCOUNTER — Encounter: Payer: Self-pay | Admitting: Internal Medicine

## 2015-10-26 VITALS — BP 148/80 | HR 91 | Temp 99.0°F | Resp 16 | Ht 63.5 in | Wt 189.0 lb

## 2015-10-26 DIAGNOSIS — Z87442 Personal history of urinary calculi: Secondary | ICD-10-CM

## 2015-10-26 DIAGNOSIS — L270 Generalized skin eruption due to drugs and medicaments taken internally: Secondary | ICD-10-CM

## 2015-10-26 DIAGNOSIS — R310 Gross hematuria: Secondary | ICD-10-CM

## 2015-10-26 DIAGNOSIS — L03319 Cellulitis of trunk, unspecified: Secondary | ICD-10-CM

## 2015-10-26 DIAGNOSIS — L02219 Cutaneous abscess of trunk, unspecified: Secondary | ICD-10-CM

## 2015-10-26 DIAGNOSIS — T50995A Adverse effect of other drugs, medicaments and biological substances, initial encounter: Secondary | ICD-10-CM

## 2015-10-26 NOTE — Patient Instructions (Signed)
Incision and Drainage Incision and drainage is a procedure in which a sac-like structure (cystic structure) is opened and drained. The area to be drained usually contains material such as pus, fluid, or blood.  LET YOUR CAREGIVER KNOW ABOUT:   Allergies to medicine.  Medicines taken, including vitamins, herbs, eyedrops, over-the-counter medicines, and creams.  Use of steroids (by mouth or creams).  Previous problems with anesthetics or numbing medicines.  History of bleeding problems or blood clots.  Previous surgery.  Other health problems, including diabetes and kidney problems.  Possibility of pregnancy, if this applies. RISKS AND COMPLICATIONS  Pain.  Bleeding.  Scarring.  Infection. BEFORE THE PROCEDURE  You may need to have an ultrasound or other imaging tests to see how large or deep your cystic structure is. Blood tests may also be used to determine if you have an infection or how severe the infection is. You may need to have a tetanus shot. PROCEDURE  The affected area is cleaned with a cleaning fluid. The cyst area will then be numbed with a medicine (local anesthetic). A small incision will be made in the cystic structure. A syringe or catheter may be used to drain the contents of the cystic structure, or the contents may be squeezed out. The area will then be flushed with a cleansing solution. After cleansing the area, it is often gently packed with a gauze or another wound dressing. Once it is packed, it will be covered with gauze and tape or some other type of wound dressing. AFTER THE PROCEDURE   Often, you will be allowed to go home right after the procedure.  You may be given antibiotic medicine to prevent or heal an infection.  If the area was packed with gauze or some other wound dressing, you will likely need to come back in 1 to 2 days to get it removed.  The area should heal in about 14 days.   This information is not intended to replace advice given  to you by your health care provider. Make sure you discuss any questions you have with your health care provider.   Document Released: 12/18/2000 Document Revised: 12/24/2011 Document Reviewed: 08/19/2011 Elsevier Interactive Patient Education 2016 [employer]  

## 2015-10-26 NOTE — Progress Notes (Signed)
Subjective:  Patient ID: Katie Stout, female    DOB: 10-27-1945  Age: 70 y.o. MRN: [id number]  CC: Wound Check   HPI Katie Stout presents for a wound check. She was seen one day ago for incision and drainage on her left anterior chest wall. She complains that the packing makes her uncomfortable and she had some drainage over night. She wants the packing removed. The culture is positive only for some gram-positive cocci at this time.  The drug rash is resolving and does not cause her any pain or itching.  Her right flank pain is diminishing and she has not noticed any more episodes of blood in urine.  Outpatient Prescriptions Prior to Visit  Medication Sig Dispense Refill  . ALPRAZolam (XANAX) 0.25 MG tablet Take 1 tablet (0.25 mg total) by mouth at bedtime as needed for sleep. 30 tablet 2  . calcium carbonate (TUMS CALCIUM FOR LIFE BONE) 750 MG chewable tablet Chew 2-4 tablets by mouth daily.      . cholecalciferol (VITAMIN D) 1000 UNITS tablet Take 1,000 Units by mouth daily.      [name] doxycycline (MONODOX) 100 MG capsule Take 1 capsule (100 mg total) by mouth 2 (two) times daily. 20 capsule 0  . fluticasone (FLONASE) 50 MCG/ACT nasal spray Place 2 sprays into both nostrils daily. 16 g 3  . hydrochlorothiazide (HYDRODIURIL) 25 MG tablet TAKE 1 TABLET EVERY DAY 90 tablet 1  . HYDROmorphone (DILAUDID) 2 MG tablet Take 0.5 tablets (1 mg total) by mouth every 6 (six) hours as needed for severe pain. 30 tablet 0  . hyoscyamine (LEVSIN SL) 0.125 MG SL tablet Place 1 tablet (0.125 mg total) under the tongue every 4 (four) hours as needed for cramping. 30 tablet 4  . rosuvastatin (CRESTOR) 10 MG tablet Take 1 tablet (10 mg total) by mouth daily. 90 tablet 3   No facility-administered medications prior to visit.    ROS Review of Systems  Constitutional: Negative.  Negative for fever, chills, diaphoresis, appetite change and fatigue.  HENT: Negative.   Eyes: Negative.   Respiratory:  Negative.  Negative for cough, choking, chest tightness, shortness of breath and stridor.   Cardiovascular: Negative.  Negative for chest pain, palpitations and leg swelling.  Gastrointestinal: Negative.  Negative for nausea, vomiting, abdominal pain, diarrhea and constipation.  Endocrine: Negative.   Genitourinary: Negative.   Musculoskeletal: Negative.  Negative for myalgias, back pain, joint swelling and arthralgias.  Skin: Positive for rash. Negative for color change and pallor.  Allergic/Immunologic: Negative.   Neurological: Negative.  Negative for dizziness.  Hematological: Negative.  Negative for adenopathy. Does not bruise/bleed easily.  Psychiatric/Behavioral: Negative.     Objective:  BP 148/80 mmHg  Pulse 91  Temp(Src) 99 F (37.2 C) (Oral)  Resp 16  Ht 5' 3.5" (1.613 m)  Wt 189 lb (85.73 kg)  BMI 32.95 kg/m2  SpO2 96%  BP Readings from Last 3 Encounters:  10/26/15 148/80  10/25/15 140/80  10/23/15 162/70    Wt Readings from Last 3 Encounters:  10/26/15 189 lb (85.73 kg)  10/25/15 189 lb (85.73 kg)  10/23/15 189 lb (85.73 kg)    Physical Exam  Constitutional: She appears well-developed and well-nourished. No distress.  HENT:  Mouth/Throat: Oropharynx is clear and moist. No oropharyngeal exudate.  Eyes: Conjunctivae are normal. Right eye exhibits no discharge. Left eye exhibits no discharge. No scleral icterus.  Neck: Normal range of motion. Neck supple. No JVD present. No tracheal deviation  present. No thyromegaly present.  Cardiovascular: Normal rate, regular rhythm, normal heart sounds and intact distal pulses.  Exam reveals no gallop and no friction rub.   No murmur heard. Pulmonary/Chest: Effort normal and breath sounds normal. No stridor. No respiratory distress. She has no wheezes. She has no rales. She exhibits deformity. She exhibits no tenderness.    Abdominal: Soft. Bowel sounds are normal. She exhibits no distension and no mass. There is no  tenderness. There is no rebound and no guarding.  Musculoskeletal: Normal range of motion. She exhibits no edema or tenderness.  Lymphadenopathy:    She has no cervical adenopathy.  Skin: Skin is warm and dry. Rash noted. Rash is maculopapular. She is not diaphoretic. No erythema. No pallor.  She has a fading, pinkish morbilliform eruption. There are no target lesions and no involvement of the palms, soles, or mucous membranes.  Vitals reviewed.   Lab Results  Component Value Date   WBC 7.3 10/25/2015   HGB 14.3 10/25/2015   HCT 42.3 10/25/2015   PLT 263.0 10/25/2015   GLUCOSE 110* 10/25/2015   CHOL 165 05/01/2015   TRIG 116.0 05/01/2015   HDL 58.80 05/01/2015   LDLDIRECT 132.2 06/10/2012   LDLCALC 83 05/01/2015   ALT 21 10/25/2015   AST 20 10/25/2015   NA 140 10/25/2015   K 3.8 10/25/2015   CL 100 10/25/2015   CREATININE 0.68 10/25/2015   BUN 9 10/25/2015   CO2 33* 10/25/2015   TSH 3.86 05/01/2015   HGBA1C 5.9 12/26/2006    Ct Soft Tissue Neck W Contrast  03/10/2015  CLINICAL DATA:  Hoarseness. History of benign amyloid laryngeal tumor 2011 EXAM: CT NECK WITH CONTRAST TECHNIQUE: Multidetector CT imaging of the neck was performed using the standard protocol following the bolus administration of intravenous contrast. CONTRAST:  56mL ISOVUE-300 IOPAMIDOL (ISOVUE-300) INJECTION 61% COMPARISON:  None. FINDINGS: Pharynx and larynx: Normal pharynx. Epiglottis normal. Soft tissue mass just above the left vocal cord in the region of the false cord. This is well-circumscribed and appears submucosal and has soft tissue density. The mass measures 12 x 6 mm and does not contain calcification. There is mild mass-effect on the mucosal surface. Right ventricle normal. Vocal cords are normal. Given the history of amyloid other vocal cord, this is consistent with amyloid deposition. Salivary glands: Negative Thyroid: Normal thyroid size.  6 mm left thyroid nodule. Lymph nodes: Negative for  lymphadenopathy Vascular: Mild carotid artery calcification in the carotid bifurcation bilaterally. Limited intracranial: Negative Visualized orbits: Bilateral lens extraction.  No orbital mass. Mastoids and visualized paranasal sinuses: Negative Skeleton: Cervical degenerative change and scoliosis. Moderate spondylosis C5-6 and C6-7. Mild anterior slip C3-4 and C4-5 related to facet degeneration. Prominent facet degeneration on the left at C2-3 and C3-4 and on the right at C4-5. Upper chest: Lung apices are clear IMPRESSION: Soft tissue mass in the false cord on the left. 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 No adenopathy.  Incidental 6 mm left thyroid nodule. Cervical degenerative change. Electronically Signed   By: Franchot Gallo M.D.   On: 03/10/2015 13:47    ModerateP    Gram Stain WBC present-predominately PMNP   Gram Stain No Squamous Epithelial Cells SeenP   Gram Stain Few Gram Positive Cocci In PairsP           Assessment & Plan:   Ellice was seen today for wound check.  Diagnoses and all orders for  this visit:  Cellulitis and abscess of trunk- packing removed, improvement noted, will continue them. Doxycycline  Hematuria, gross- her CT scan is positive for bilateral renal stones, will refer to urology  RENAL CALCULUS, HX OF  Allergic drug rash- rash is resolving and does not cause her any symptoms, no additional treatment offered at this time.   I am having Ms. Haskew maintain her calcium carbonate, cholecalciferol, fluticasone, hyoscyamine, rosuvastatin, hydrochlorothiazide, ALPRAZolam, doxycycline, and HYDROmorphone.  No orders of the defined types were placed in this encounter.     Follow-up: Return in about 5 days (around 10/31/2015).  [name], MD

## 2015-10-26 NOTE — Progress Notes (Signed)
Pre visit review using our clinic review tool, if applicable. No additional management support is needed unless otherwise documented below in the visit note. 

## 2015-10-27 ENCOUNTER — Ambulatory Visit: Payer: Medicare Other | Admitting: Internal Medicine

## 2015-10-27 ENCOUNTER — Telehealth: Payer: Self-pay | Admitting: Internal Medicine

## 2015-10-27 ENCOUNTER — Ambulatory Visit (INDEPENDENT_AMBULATORY_CARE_PROVIDER_SITE_OTHER)
Admission: RE | Admit: 2015-10-27 | Discharge: 2015-10-27 | Disposition: A | Discharge status: Home / Self Care | Payer: Medicare Other | Source: Ambulatory Visit | Attending: Internal Medicine | Admitting: Internal Medicine

## 2015-10-27 DIAGNOSIS — R310 Gross hematuria: Secondary | ICD-10-CM | POA: Diagnosis not present

## 2015-10-27 DIAGNOSIS — K8020 Calculus of gallbladder without cholecystitis without obstruction: Secondary | ICD-10-CM | POA: Diagnosis not present

## 2015-10-27 DIAGNOSIS — N200 Calculus of kidney: Secondary | ICD-10-CM | POA: Diagnosis not present

## 2015-10-27 LAB — CULTURE, URINE COMPREHENSIVE
Colony Count: NO GROWTH
Organism ID, Bacteria: NO GROWTH

## 2015-10-27 NOTE — Telephone Encounter (Signed)
CONFIDENTIALTY NOTICE: This fax transmission is intended only for the addressee. It contains information that is legally privileged, confidential or otherwise protected from use or disclosure. If you are not the intended recipient, you are strictly prohibited from reviewing, disclosing, copying using or disseminating any of this information or taking any action in reliance on or regarding this information. If you have received this fax in error, please notify us immediately by telephone so that we can arrange for its return to [place]. Phone: [phone], Toll-Free: [phone], Fax: [phone] Page: 1 of 1 Call Id: [id number] [name] [name] DOB: Jun 27, 1946 Initial Comment Caller states had packing for an abscess, hole is wet after taken a shower, not able to put a bra on and would like to know what to put on it. Nurse Assessment Nurse: Germain Osgood RN, Opal Sidles Date/Time Eilene Ghazi Time): 10/27/2015 4:05:44 PM Confirm and document reason for call. If symptomatic, describe symptoms. You must click the next button to save text entered. ---Caller states her packing was removed from her wound below breast. Dressing applied at office has come off and was concerned about what she has used to cover ( White sterile cloth and dressing ) covered with band aid. Seems to be wet. Has the patient traveled out of the country within the last 30 days? ---Not Applicable Does the patient have any new or worsening symptoms? ---Yes Will a triage be completed? ---Yes Related visit to physician within the last 2 weeks? ---Yes Does the PT have any chronic conditions? (i.e. diabetes, asthma, etc.) ---Unknown Is this a behavioral health or substance abuse call? ---No Guidelines Guideline Title Affirmed Question Affirmed Notes Post-Op Incision Symptoms Wet to dry dressing (packing), questions about. Final  Disposition User [place], RN, Opal Sidles Comments Caller advised she could purchase gauze dressings either 2 x 2 or 4 x 4 at pharmacy and Tegaderm like dressings to cover and protect from drainage. Disagree/Comply: Comply

## 2015-10-28 ENCOUNTER — Encounter: Payer: Self-pay | Admitting: Internal Medicine

## 2015-10-28 LAB — WOUND CULTURE
Gram Stain: NONE SEEN
Organism ID, Bacteria: NORMAL

## 2015-11-02 ENCOUNTER — Encounter: Payer: Self-pay | Admitting: Internal Medicine

## 2015-11-02 ENCOUNTER — Ambulatory Visit (INDEPENDENT_AMBULATORY_CARE_PROVIDER_SITE_OTHER): Payer: Medicare Other | Admitting: Internal Medicine

## 2015-11-02 VITALS — BP 136/78 | HR 89 | Temp 98.8°F | Resp 16 | Ht 63.5 in | Wt 189.0 lb

## 2015-11-02 DIAGNOSIS — Z0000 Encounter for general adult medical examination without abnormal findings: Secondary | ICD-10-CM

## 2015-11-02 DIAGNOSIS — L02219 Cutaneous abscess of trunk, unspecified: Secondary | ICD-10-CM

## 2015-11-02 DIAGNOSIS — I10 Essential (primary) hypertension: Secondary | ICD-10-CM

## 2015-11-02 DIAGNOSIS — L03319 Cellulitis of trunk, unspecified: Secondary | ICD-10-CM

## 2015-11-02 NOTE — Patient Instructions (Addendum)
Katie Stout , Thank you for taking time to come for your Medicare Wellness Visit. I appreciate your ongoing commitment to your health goals. Please review the following plan we discussed and let me know if I can assist you in the future.   Will bring copy of your Advanced Directive for copy to medical record  Take rest periods when you can from child care.  De[facility] Services  No reviews  [employer] Office  [place] [id number]  [phone]  ----   These are the goals we discussed: Goals    . Weight < 175 lb (79.379 kg)     Can watch calorie count at [email or web address] You can use http://vang.com/  Search recipes;   Eat clean x 3 days;  Shop the perimeter of the grocery store; salads; vegetables; fruit; fish; etc Breakfast; meat, fruit, starch Lunch; meat; 2 starches; vegetable; low dressing if a salad Supper; meat 2 vegetables; fruit;   Snack in the afternoon; or/and evening        This is a list of the screening recommended for you and due dates:  Health Maintenance  Topic Date Due  . Flu Shot  02/06/2016  . Mammogram  04/30/2017  . Colon Cancer Screening  02/11/2023  . Tetanus Vaccine  06/07/2024  . DEXA scan (bone density measurement)  Completed  . Shingles Vaccine  Completed  .  Hepatitis C: One time screening is recommended by Center for Disease Control  (CDC) for  adults born from [birth year] through 1966.   Completed  . Pneumonia vaccines  Completed      Hypertension Hypertension, commonly called high blood pressure, is when the force of blood pumping through your arteries is too strong. Your arteries are the blood vessels that carry blood from your heart throughout your body. A blood pressure reading consists of a higher number over a lower number, such as 110/72. The higher number (systolic) is the pressure inside your arteries when your heart pumps. The lower number (diastolic) is the pressure inside your arteries when your heart  relaxes. Ideally you want your blood pressure below 120/80. Hypertension forces your heart to work harder to pump blood. Your arteries may become narrow or stiff. Having untreated or uncontrolled hypertension can cause heart attack, stroke, kidney disease, and other problems. RISK FACTORS Some risk factors for high blood pressure are controllable. Others are not.  Risk factors you cannot control include:   Race. You may be at higher risk if you are African American.  Age. Risk increases with age.  Gender. Men are at higher risk than women before age 60 years. After age 5, women are at higher risk than men. Risk factors you can control include:  Not getting enough exercise or physical activity.  Being overweight.  Getting too much fat, sugar, calories, or salt in your diet.  Drinking too much alcohol. SIGNS AND SYMPTOMS Hypertension does not usually cause signs or symptoms. Extremely high blood pressure (hypertensive crisis) may cause headache, anxiety, shortness of breath, and nosebleed. DIAGNOSIS To check if you have hypertension, your health care provider will measure your blood pressure while you are seated, with your arm held at the level of your heart. It should be measured at least twice using the same arm. Certain conditions can cause a difference in blood pressure between your right and left arms. A blood pressure reading that is higher than normal on one occasion does not mean that you need treatment.  If it is not clear whether you have high blood pressure, you may be asked to return on a different day to have your blood pressure checked again. Or, you may be asked to monitor your blood pressure at home for 1 or more weeks. TREATMENT Treating high blood pressure includes making lifestyle changes and possibly taking medicine. Living a healthy lifestyle can help lower high blood pressure. You may need to change some of your habits. Lifestyle changes may include:  Following the  DASH diet. This diet is high in fruits, vegetables, and whole grains. It is low in salt, red meat, and added sugars.  Keep your sodium intake below 2,300 mg per day.  Getting at least 30-45 minutes of aerobic exercise at least 4 times per week.  Losing weight if necessary.  Not smoking.  Limiting alcoholic beverages.  Learning ways to reduce stress. Your health care provider may prescribe medicine if lifestyle changes are not enough to get your blood pressure under control, and if one of the following is true:  You are 22-47 years of age and your systolic blood pressure is above 140.  You are 65 years of age or older, and your systolic blood pressure is above 150.  Your diastolic blood pressure is above 90.  You have diabetes, and your systolic blood pressure is over [id number] or your diastolic blood pressure is over 90.  You have kidney disease and your blood pressure is above 140/90.  You have heart disease and your blood pressure is above 140/90. Your personal target blood pressure may vary depending on your medical conditions, your age, and other factors. HOME CARE INSTRUCTIONS  Have your blood pressure rechecked as directed by your health care provider.   Take medicines only as directed by your health care provider. Follow the directions carefully. Blood pressure medicines must be taken as prescribed. The medicine does not work as well when you skip doses. Skipping doses also puts you at risk for problems.  Do not smoke.   Monitor your blood pressure at home as directed by your health care provider. SEEK MEDICAL CARE IF:   You think you are having a reaction to medicines taken.  You have recurrent headaches or feel dizzy.  You have swelling in your ankles.  You have trouble with your vision. SEEK IMMEDIATE MEDICAL CARE IF:  You develop a severe headache or confusion.  You have unusual weakness, numbness, or feel faint.  You have severe chest or abdominal  pain.  You vomit repeatedly.  You have trouble breathing. MAKE SURE YOU:   Understand these instructions.  Will watch your condition.  Will get help right away if you are not doing well or get worse.   This information is not intended to replace advice given to you by your health care provider. Make sure you discuss any questions you have with your health care provider.   Document Released: 06/24/2005 Document Revised: 11/08/2014 Document Reviewed: 04/16/2013 Elsevier Interactive Patient Education [employer].

## 2015-11-02 NOTE — Progress Notes (Signed)
Pre visit review using our clinic review tool, if applicable. No additional management support is needed unless otherwise documented below in the visit note. 
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
## 2015-11-04 NOTE — Progress Notes (Signed)
Subjective:  Patient ID: Katie Stout, female    DOB: [birth year]  Age: 70 y.o. MRN: [id number]  CC: Wound Check; Hypertension; and Medicare Wellness   HPI Katie Stout presents for a wound check after incision and drainage of an area just below her left breast. The culture was positive for routine skin floor. She tells me the area continues to improve with less pain and swelling. There is been no additional exudate or bleeding.  She also tells me that her blood pressures been well controlled on hydrochlorothiazide. She has had no episodes of headache, blurred vision, chest pain, shortness of breath, palpitations, or edema.  Outpatient Prescriptions Prior to Visit  Medication Sig Dispense Refill  . ALPRAZolam (XANAX) 0.25 MG tablet Take 1 tablet (0.25 mg total) by mouth at bedtime as needed for sleep. 30 tablet 2  . calcium carbonate (TUMS CALCIUM FOR LIFE BONE) 750 MG chewable tablet Chew 2-4 tablets by mouth daily.      . cholecalciferol (VITAMIN D) 1000 UNITS tablet Take 1,000 Units by mouth daily.      [name] doxycycline (MONODOX) 100 MG capsule Take 1 capsule (100 mg total) by mouth 2 (two) times daily. 20 capsule 0  . fluticasone (FLONASE) 50 MCG/ACT nasal spray Place 2 sprays into both nostrils daily. 16 g 3  . hydrochlorothiazide (HYDRODIURIL) 25 MG tablet TAKE 1 TABLET EVERY DAY 90 tablet 1  . hyoscyamine (LEVSIN SL) 0.125 MG SL tablet Place 1 tablet (0.125 mg total) under the tongue every 4 (four) hours as needed for cramping. 30 tablet 4  . rosuvastatin (CRESTOR) 10 MG tablet Take 1 tablet (10 mg total) by mouth daily. 90 tablet 3  . HYDROmorphone (DILAUDID) 2 MG tablet Take 0.5 tablets (1 mg total) by mouth every 6 (six) hours as needed for severe pain. 30 tablet 0   No facility-administered medications prior to visit.    ROS Review of Systems  Constitutional: Negative.  Negative for fever, chills and fatigue.  HENT: Negative.   Eyes: Negative.   Respiratory: Negative.   Negative for cough, choking, chest tightness, shortness of breath and stridor.   Cardiovascular: Negative.  Negative for chest pain, palpitations and leg swelling.  Gastrointestinal: Negative.  Negative for nausea, vomiting, abdominal pain, diarrhea and constipation.  Endocrine: Negative.   Genitourinary: Negative.   Musculoskeletal: Negative.  Negative for myalgias, back pain, joint swelling and arthralgias.  Skin: Positive for rash and wound.  Allergic/Immunologic: Negative.   Neurological: Negative.  Negative for dizziness.  Hematological: Negative.  Negative for adenopathy. Does not bruise/bleed easily.  Psychiatric/Behavioral: Negative.     Objective:  BP 136/78 mmHg  Pulse 89  Temp(Src) 98.8 F (37.1 C) (Oral)  Resp 16  Ht 5' 3.5" (1.613 m)  Wt 189 lb (85.73 kg)  BMI 32.95 kg/m2  SpO2 96%  BP Readings from Last 3 Encounters:  11/02/15 136/78  10/26/15 148/80  10/25/15 140/80    Wt Readings from Last 3 Encounters:  11/02/15 189 lb (85.73 kg)  10/26/15 189 lb (85.73 kg)  10/25/15 189 lb (85.73 kg)    Physical Exam  Constitutional: She is oriented to person, place, and time. No distress.  HENT:  Mouth/Throat: Oropharynx is clear and moist. No oropharyngeal exudate.  Eyes: Conjunctivae are normal. Right eye exhibits no discharge. Left eye exhibits no discharge. No scleral icterus.  Neck: Normal range of motion. Neck supple. No JVD present. No tracheal deviation present. No thyromegaly present.  Cardiovascular: Normal rate, regular rhythm,  normal heart sounds and intact distal pulses.  Exam reveals no gallop.   No murmur heard.   Pulmonary/Chest: Effort normal and breath sounds normal. No stridor. No respiratory distress. She has no wheezes. She has no rales. She exhibits no tenderness.  Abdominal: Soft. Bowel sounds are normal. She exhibits no distension and no mass. There is no tenderness. There is no rebound and no guarding.  Musculoskeletal: Normal range of  motion. She exhibits no edema or tenderness.  Lymphadenopathy:    She has no cervical adenopathy.  Neurological: She is oriented to person, place, and time.  Skin: Skin is warm and dry. No rash noted. She is not diaphoretic. No erythema. No pallor.  Vitals reviewed.   Lab Results  Component Value Date   WBC 7.3 10/25/2015   HGB 14.3 10/25/2015   HCT 42.3 10/25/2015   PLT 263.0 10/25/2015   GLUCOSE 110* 10/25/2015   CHOL 165 05/01/2015   TRIG 116.0 05/01/2015   HDL 58.80 05/01/2015   LDLDIRECT 132.2 06/10/2012   LDLCALC 83 05/01/2015   ALT 21 10/25/2015   AST 20 10/25/2015   NA 140 10/25/2015   K 3.8 10/25/2015   CL 100 10/25/2015   CREATININE 0.68 10/25/2015   BUN 9 10/25/2015   CO2 33* 10/25/2015   TSH 3.86 05/01/2015   HGBA1C 5.9 12/26/2006    Ct Renal Stone Study  10/27/2015  CLINICAL DATA:  Evaluate hematuria. EXAM: CT ABDOMEN AND PELVIS WITHOUT CONTRAST TECHNIQUE: Multidetector CT imaging of the abdomen and pelvis was performed following the standard protocol without IV contrast. COMPARISON:  12/14/2012 FINDINGS: Lower chest: No pleural effusion identified. Lung bases scratch set scar versus atelectasis noted in the lung bases. Hepatobiliary: Posterior right lobe of liver cyst measures 1 cm, image 18 of series 2. Multiple stones are identified within the gallbladder. No gallbladder wall thickening or pericholecystic fluid. Pancreas: The pancreas is normal. Spleen: Normal appearance of the spleen. Adrenals/Urinary Tract: The adrenal glands are normal. There is a large cyst within the left kidney measuring 9.6 cm, image 22 of series 2. This is incompletely characterized without IV contrast. Bilateral renal calculi are identified. The largest is in the posterior left kidney measuring 1.3 cm, image 24 of series 2. No hydronephrosis or hydroureter. No ureteral lithiasis. The urinary bladder appears normal. Stomach/Bowel: The stomach is within normal limits. The small bowel loops have  a normal course and caliber. No obstruction. The appendix is visualized and appears normal. Numerous distal colonic diverticula identified without acute inflammation. Vascular/Lymphatic: Calcified atherosclerotic disease involves the abdominal aorta. No aneurysm. No enlarged retroperitoneal or mesenteric adenopathy. No enlarged pelvic or inguinal lymph nodes. Reproductive: Previous hysterectomy.  No adnexal mass. Other: There is no ascites or focal fluid collections within the abdomen or pelvis. Musculoskeletal: No aggressive lytic or sclerotic bone lesions. Anterolisthesis of L4 on L5 is noted. IMPRESSION: 1. No acute findings within the abdomen or pelvis. 2. Bilateral nephrolithiasis. 3. Gallstones. 4. Aortic atherosclerosis Electronically Signed   By: Kerby Moors M.D.   On: 10/27/2015 14:48    Assessment & Plan:   Katie Stout was seen today for wound check, hypertension and medicare wellness.  Diagnoses and all orders for this visit:  Essential hypertension- her blood pressure is well-controlled  Cellulitis and abscess of trunk- improvement noted, she will let me know if she develops any new or worsening symptoms related to this area.   I have discontinued Katie Stout HYDROmorphone. I am also having her maintain her calcium carbonate, cholecalciferol,  fluticasone, hyoscyamine, rosuvastatin, hydrochlorothiazide, ALPRAZolam, and doxycycline.  No orders of the defined types were placed in this encounter.     Follow-up: Return in about 6 months (around 05/03/2016).  [name], MD

## 2015-11-09 DIAGNOSIS — N200 Calculus of kidney: Secondary | ICD-10-CM | POA: Diagnosis not present

## 2015-11-09 DIAGNOSIS — R310 Gross hematuria: Secondary | ICD-10-CM | POA: Diagnosis not present

## 2015-11-09 DIAGNOSIS — N281 Cyst of kidney, acquired: Secondary | ICD-10-CM | POA: Diagnosis not present

## 2015-11-09 DIAGNOSIS — Z0000 Encounter for general adult medical examination without abnormal findings: Secondary | ICD-10-CM | POA: Diagnosis not present

## 2015-11-15 DIAGNOSIS — L720 Epidermal cyst: Secondary | ICD-10-CM | POA: Diagnosis not present

## 2015-11-15 DIAGNOSIS — L723 Sebaceous cyst: Secondary | ICD-10-CM | POA: Diagnosis not present

## 2015-12-15 ENCOUNTER — Other Ambulatory Visit: Payer: Self-pay | Admitting: Internal Medicine

## 2016-01-10 ENCOUNTER — Other Ambulatory Visit (INDEPENDENT_AMBULATORY_CARE_PROVIDER_SITE_OTHER): Payer: Medicare Other

## 2016-01-10 ENCOUNTER — Encounter: Payer: Self-pay | Admitting: Internal Medicine

## 2016-01-10 ENCOUNTER — Ambulatory Visit (INDEPENDENT_AMBULATORY_CARE_PROVIDER_SITE_OTHER): Payer: Medicare Other | Admitting: Internal Medicine

## 2016-01-10 VITALS — BP 140/74 | HR 95 | Temp 98.1°F | Resp 16 | Wt 192.0 lb

## 2016-01-10 DIAGNOSIS — D509 Iron deficiency anemia, unspecified: Secondary | ICD-10-CM

## 2016-01-10 DIAGNOSIS — E785 Hyperlipidemia, unspecified: Secondary | ICD-10-CM

## 2016-01-10 DIAGNOSIS — M5030 Other cervical disc degeneration, unspecified cervical region: Secondary | ICD-10-CM | POA: Insufficient documentation

## 2016-01-10 DIAGNOSIS — I10 Essential (primary) hypertension: Secondary | ICD-10-CM

## 2016-01-10 DIAGNOSIS — M436 Torticollis: Secondary | ICD-10-CM | POA: Diagnosis not present

## 2016-01-10 DIAGNOSIS — M542 Cervicalgia: Secondary | ICD-10-CM

## 2016-01-10 DIAGNOSIS — M25511 Pain in right shoulder: Secondary | ICD-10-CM

## 2016-01-10 DIAGNOSIS — M25519 Pain in unspecified shoulder: Secondary | ICD-10-CM

## 2016-01-10 DIAGNOSIS — G8929 Other chronic pain: Secondary | ICD-10-CM

## 2016-01-10 LAB — LIPID PANEL
CHOL/HDL RATIO: 3
Cholesterol: 170 mg/dL (ref 0–200)
HDL: 67.3 mg/dL (ref >39.00)
LDL CALC: 84 mg/dL (ref 0–99)
NonHDL: 102.31
Triglycerides: 93 mg/dL (ref 0.0–149.0)
VLDL: 18.6 mg/dL (ref 0.0–40.0)

## 2016-01-10 LAB — BASIC METABOLIC PANEL
BUN: 14 mg/dL (ref 6–23)
CHLORIDE: 100 meq/L (ref 96–112)
CO2: 34 meq/L — AB (ref 19–32)
CREATININE: 0.59 mg/dL (ref 0.40–1.20)
Calcium: 9.8 mg/dL (ref 8.4–10.5)
GFR: 106.9 mL/min (ref >60.00)
Glucose, Bld: 96 mg/dL (ref 70–99)
POTASSIUM: 3.9 meq/L (ref 3.5–5.1)
Sodium: 140 mEq/L (ref 135–145)

## 2016-01-10 LAB — CBC WITH DIFFERENTIAL/PLATELET
BASOS ABS: 0 10*3/uL (ref 0.0–0.1)
Basophils Relative: 0.2 % (ref 0.0–3.0)
EOS ABS: 0.3 10*3/uL (ref 0.0–0.7)
Eosinophils Relative: 2.7 % (ref 0.0–5.0)
HEMATOCRIT: 42.8 % (ref 36.0–46.0)
Hemoglobin: 14.4 g/dL (ref 12.0–15.0)
LYMPHS ABS: 0.9 10*3/uL (ref 0.7–4.0)
LYMPHS PCT: 8 % — AB (ref 12.0–46.0)
MCHC: 33.6 g/dL (ref 30.0–36.0)
MCV: 88.8 fl (ref 78.0–100.0)
MONOS PCT: 4.6 % (ref 3.0–12.0)
Monocytes Absolute: 0.5 10*3/uL (ref 0.1–1.0)
NEUTROS PCT: 84.5 % — AB (ref 43.0–77.0)
Neutro Abs: 9.3 10*3/uL — ABNORMAL HIGH (ref 1.4–7.7)
PLATELETS: 233 10*3/uL (ref 150.0–400.0)
RBC: 4.82 Mil/uL (ref 3.87–5.11)
RDW: 15.1 % (ref 11.5–15.5)
WBC: 11 10*3/uL — ABNORMAL HIGH (ref 4.0–10.5)

## 2016-01-10 LAB — TSH: TSH: 3.68 u[IU]/mL (ref 0.35–4.50)

## 2016-01-10 MED ORDER — TIZANIDINE HCL 2 MG PO TABS: 2.0000 mg | ORAL_TABLET | Freq: Three times a day (TID) | ORAL | Status: DC | PRN

## 2016-01-10 NOTE — Patient Instructions (Signed)
Hypertension Hypertension, commonly called high blood pressure, is when the force of blood pumping through your arteries is too strong. Your arteries are the blood vessels that carry blood from your heart throughout your body. A blood pressure reading consists of a higher number over a lower number, such as 110/72. The higher number (systolic) is the pressure inside your arteries when your heart pumps. The lower number (diastolic) is the pressure inside your arteries when your heart relaxes. Ideally you want your blood pressure below 120/80. Hypertension forces your heart to work harder to pump blood. Your arteries may become narrow or stiff. Having untreated or uncontrolled hypertension can cause heart attack, stroke, kidney disease, and other problems. RISK FACTORS Some risk factors for high blood pressure are controllable. Others are not.  Risk factors you cannot control include:   Race. You may be at higher risk if you are African American.  Age. Risk increases with age.  Gender. Men are at higher risk than women before age 45 years. After age 65, women are at higher risk than men. Risk factors you can control include:  Not getting enough exercise or physical activity.  Being overweight.  Getting too much fat, sugar, calories, or salt in your diet.  Drinking too much alcohol. SIGNS AND SYMPTOMS Hypertension does not usually cause signs or symptoms. Extremely high blood pressure (hypertensive crisis) may cause headache, anxiety, shortness of breath, and nosebleed. DIAGNOSIS To check if you have hypertension, your health care provider will measure your blood pressure while you are seated, with your arm held at the level of your heart. It should be measured at least twice using the same arm. Certain conditions can cause a difference in blood pressure between your right and left arms. A blood pressure reading that is higher than normal on one occasion does not mean that you need treatment. If  it is not clear whether you have high blood pressure, you may be asked to return on a different day to have your blood pressure checked again. Or, you may be asked to monitor your blood pressure at home for 1 or more weeks. TREATMENT Treating high blood pressure includes making lifestyle changes and possibly taking medicine. Living a healthy lifestyle can help lower high blood pressure. You may need to change some of your habits. Lifestyle changes may include:  Following the DASH diet. This diet is high in fruits, vegetables, and whole grains. It is low in salt, red meat, and added sugars.  Keep your sodium intake below 2,300 mg per day.  Getting at least 30-45 minutes of aerobic exercise at least 4 times per week.  Losing weight if necessary.  Not smoking.  Limiting alcoholic beverages.  Learning ways to reduce stress. Your health care provider may prescribe medicine if lifestyle changes are not enough to get your blood pressure under control, and if one of the following is true:  You are 18-59 years of age and your systolic blood pressure is above 140.  You are 60 years of age or older, and your systolic blood pressure is above 150.  Your diastolic blood pressure is above 90.  You have diabetes, and your systolic blood pressure is over 140 or your diastolic blood pressure is over 90.  You have kidney disease and your blood pressure is above 140/90.  You have heart disease and your blood pressure is above 140/90. Your personal target blood pressure may vary depending on your medical conditions, your age, and other factors. HOME CARE INSTRUCTIONS    Have your blood pressure rechecked as directed by your health care provider.   Take medicines only as directed by your health care provider. Follow the directions carefully. Blood pressure medicines must be taken as prescribed. The medicine does not work as well when you skip doses. Skipping doses also puts you at risk for  problems.  Do not smoke.   Monitor your blood pressure at home as directed by your health care provider. SEEK MEDICAL CARE IF:   You think you are having a reaction to medicines taken.  You have recurrent headaches or feel dizzy.  You have swelling in your ankles.  You have trouble with your vision. SEEK IMMEDIATE MEDICAL CARE IF:  You develop a severe headache or confusion.  You have unusual weakness, numbness, or feel faint.  You have severe chest or abdominal pain.  You vomit repeatedly.  You have trouble breathing. MAKE SURE YOU:   Understand these instructions.  Will watch your condition.  Will get help right away if you are not doing well or get worse.   This information is not intended to replace advice given to you by your health care provider. Make sure you discuss any questions you have with your health care provider.   Document Released: 06/24/2005 Document Revised: 11/08/2014 Document Reviewed: 04/16/2013 Elsevier Interactive Patient Education 2016 [employer]  

## 2016-01-10 NOTE — Progress Notes (Signed)
Subjective:  Patient ID: Katie Stout, female    DOB: [birth year]  Age: 70 y.o. MRN: [id number]  CC: Neck Pain; Hypertension; and Hyperlipidemia   HPI Katie Stout presents for follow-up on the above listed problems. She has chronic dystonia with spastic torticollis and has been seen at [facility] by Dr. Evelena Leyden and has been receiving Botox injections for 17 years. She was last seen there about 4 months ago. She wants to transfer her neurology care to somewhere locally. She complains of worsening neck pain over the last 3-4 months. She denies any recent trauma or injury. She takes Aleve for the discomfort and gets symptom relief. She's had a few episodes of numbness and tingling worse on the right hand than the left hand. She denies any trouble swallowing, walking, or talking.  Outpatient Prescriptions Prior to Visit  Medication Sig Dispense Refill  . ALPRAZolam (XANAX) 0.25 MG tablet Take 1 tablet (0.25 mg total) by mouth at bedtime as needed for sleep. 30 tablet 2  . calcium carbonate (TUMS CALCIUM FOR LIFE BONE) 750 MG chewable tablet Chew 2-4 tablets by mouth daily.      . cholecalciferol (VITAMIN D) 1000 UNITS tablet Take 1,000 Units by mouth daily.      [name] doxycycline (MONODOX) 100 MG capsule Take 1 capsule (100 mg total) by mouth 2 (two) times daily. 20 capsule 0  . fluticasone (FLONASE) 50 MCG/ACT nasal spray Place 2 sprays into both nostrils daily. 16 g 3  . hydrochlorothiazide (HYDRODIURIL) 25 MG tablet TAKE 1 TABLET EVERY DAY 90 tablet 1  . hyoscyamine (LEVSIN SL) 0.125 MG SL tablet Place 1 tablet (0.125 mg total) under the tongue every 4 (four) hours as needed for cramping. 30 tablet 4  . rosuvastatin (CRESTOR) 10 MG tablet TAKE 1 TABLET BY MOUTH DAILY. 90 tablet 3   No facility-administered medications prior to visit.    ROS Review of Systems  Constitutional: Negative.  Negative for fever, chills, diaphoresis, appetite change and fatigue.  HENT: Negative.   Negative for facial swelling, sinus pressure, trouble swallowing and voice change.   Eyes: Negative.  Negative for visual disturbance.  Respiratory: Negative.  Negative for cough, choking, chest tightness, shortness of breath and stridor.   Cardiovascular: Negative.  Negative for chest pain, palpitations and leg swelling.  Gastrointestinal: Negative.  Negative for nausea, vomiting, abdominal pain, diarrhea, constipation and blood in stool.  Endocrine: Negative.   Genitourinary: Negative.  Negative for difficulty urinating.  Musculoskeletal: Positive for arthralgias, neck pain and neck stiffness. Negative for myalgias, back pain, joint swelling and gait problem.       Chronic right shoulder pain  Allergic/Immunologic: Negative.   Neurological: Positive for numbness. Negative for dizziness, weakness, light-headedness and headaches.  Hematological: Negative.  Negative for adenopathy. Does not bruise/bleed easily.  Psychiatric/Behavioral: Negative.     Objective:  BP 140/74 mmHg  Pulse 95  Temp(Src) 98.1 F (36.7 C) (Oral)  Resp 16  Wt 192 lb (87.091 kg)  SpO2 95%  BP Readings from Last 3 Encounters:  01/10/16 140/74  11/02/15 136/78  10/26/15 148/80    Wt Readings from Last 3 Encounters:  01/10/16 192 lb (87.091 kg)  11/02/15 189 lb (85.73 kg)  10/26/15 189 lb (85.73 kg)    Physical Exam  Constitutional: She is oriented to person, place, and time. No distress.  HENT:  Mouth/Throat: Oropharynx is clear and moist. No oropharyngeal exudate.  Eyes: Conjunctivae are normal. Right eye exhibits no discharge.  Left eye exhibits no discharge. No scleral icterus.  Neck: Normal range of motion. Neck supple. No JVD present. No tracheal deviation present. No thyromegaly present.  Cardiovascular: Normal rate, regular rhythm, normal heart sounds and intact distal pulses.  Exam reveals no gallop and no friction rub.   No murmur heard. Pulmonary/Chest: Effort normal and breath sounds normal.  No stridor. No respiratory distress. She has no wheezes. She has no rales. She exhibits no tenderness.  Abdominal: Soft. Bowel sounds are normal. She exhibits no distension. There is no tenderness. There is no rebound and no guarding.  Musculoskeletal: She exhibits no edema or tenderness.       Right shoulder: She exhibits decreased range of motion. She exhibits no tenderness, no bony tenderness, no swelling, no effusion, no deformity, no pain and no spasm.       Cervical back: She exhibits decreased range of motion, deformity (scoliosis ) and spasm. She exhibits no tenderness, no bony tenderness, no swelling, no edema and no pain.  Lymphadenopathy:    She has no cervical adenopathy.  Neurological: She is alert and oriented to person, place, and time. She has normal reflexes. She displays normal reflexes. No cranial nerve deficit. She exhibits normal muscle tone. Coordination normal.  Skin: Skin is warm and dry. No rash noted. She is not diaphoretic. No erythema. No pallor.  Vitals reviewed.   Lab Results  Component Value Date   WBC 11.0* 01/10/2016   HGB 14.4 01/10/2016   HCT 42.8 01/10/2016   PLT 233.0 01/10/2016   GLUCOSE 96 01/10/2016   CHOL 170 01/10/2016   TRIG 93.0 01/10/2016   HDL 67.30 01/10/2016   LDLDIRECT 132.2 06/10/2012   LDLCALC 84 01/10/2016   ALT 21 10/25/2015   AST 20 10/25/2015   NA 140 01/10/2016   K 3.9 01/10/2016   CL 100 01/10/2016   CREATININE 0.59 01/10/2016   BUN 14 01/10/2016   CO2 34* 01/10/2016   TSH 3.68 01/10/2016   HGBA1C 5.9 12/26/2006    Ct Renal Stone Study  10/27/2015  CLINICAL DATA:  Evaluate hematuria. EXAM: CT ABDOMEN AND PELVIS WITHOUT CONTRAST TECHNIQUE: Multidetector CT imaging of the abdomen and pelvis was performed following the standard protocol without IV contrast. COMPARISON:  12/14/2012 FINDINGS: Lower chest: No pleural effusion identified. Lung bases scratch set scar versus atelectasis noted in the lung bases. Hepatobiliary:  Posterior right lobe of liver cyst measures 1 cm, image 18 of series 2. Multiple stones are identified within the gallbladder. No gallbladder wall thickening or pericholecystic fluid. Pancreas: The pancreas is normal. Spleen: Normal appearance of the spleen. Adrenals/Urinary Tract: The adrenal glands are normal. There is a large cyst within the left kidney measuring 9.6 cm, image 22 of series 2. This is incompletely characterized without IV contrast. Bilateral renal calculi are identified. The largest is in the posterior left kidney measuring 1.3 cm, image 24 of series 2. No hydronephrosis or hydroureter. No ureteral lithiasis. The urinary bladder appears normal. Stomach/Bowel: The stomach is within normal limits. The small bowel loops have a normal course and caliber. No obstruction. The appendix is visualized and appears normal. Numerous distal colonic diverticula identified without acute inflammation. Vascular/Lymphatic: Calcified atherosclerotic disease involves the abdominal aorta. No aneurysm. No enlarged retroperitoneal or mesenteric adenopathy. No enlarged pelvic or inguinal lymph nodes. Reproductive: Previous hysterectomy.  No adnexal mass. Other: There is no ascites or focal fluid collections within the abdomen or pelvis. Musculoskeletal: No aggressive lytic or sclerotic bone lesions. Anterolisthesis of L4 on  L5 is noted. IMPRESSION: 1. No acute findings within the abdomen or pelvis. 2. Bilateral nephrolithiasis. 3. Gallstones. 4. Aortic atherosclerosis Electronically Signed   By: Kerby Moors M.D.   On: 10/27/2015 14:48    Assessment & Plan:   Bahar was seen today for neck pain, hypertension and hyperlipidemia.  Diagnoses and all orders for this visit:  Essential hypertension- Her blood pressure is well-controlled, electrolytes and renal function are stable. -     CBC with Differential/Platelet; Future -     Basic metabolic panel; Future -     TSH; Future  Spastic torticollis -      Ambulatory referral to Neurology -     tiZANidine (ZANAFLEX) 2 MG tablet; Take 1 tablet (2 mg total) by mouth every 8 (eight) hours as needed for muscle spasms.  Neck pain on right side -     Ambulatory referral to Sports Medicine -     tiZANidine (ZANAFLEX) 2 MG tablet; Take 1 tablet (2 mg total) by mouth every 8 (eight) hours as needed for muscle spasms.  Iron deficiency anemia, unspecified - improvement noted  Chronic shoulder pain, right -     Ambulatory referral to Sports Medicine  Hyperlipidemia with target LDL less than 130- she has achieved her LDL goal is doing well on Crestor -     Lipid panel; Future -     TSH; Future  I am having Ms. Ferre start on tiZANidine. I am also having her maintain her calcium carbonate, cholecalciferol, fluticasone, hyoscyamine, hydrochlorothiazide, ALPRAZolam, doxycycline, rosuvastatin, and CENTRUM SILVER 50+WOMEN.  Meds ordered this encounter  Medications  . Multiple Vitamins-Minerals (CENTRUM SILVER 50+WOMEN) TABS    Sig: Take 1 tablet by mouth daily.  [name] tiZANidine (ZANAFLEX) 2 MG tablet    Sig: Take 1 tablet (2 mg total) by mouth every 8 (eight) hours as needed for muscle spasms.    Dispense:  90 tablet    Refill:  3     Follow-up: Return in about 6 months (around 07/12/2016).  [name], MD

## 2016-01-10 NOTE — Progress Notes (Signed)
Pre visit review using our clinic review tool, if applicable. No additional management support is needed unless otherwise documented below in the visit note. 

## 2016-01-17 ENCOUNTER — Ambulatory Visit: Payer: Medicare Other | Admitting: Internal Medicine

## 2016-01-22 ENCOUNTER — Telehealth: Payer: Self-pay | Admitting: Neurology

## 2016-01-22 NOTE — Telephone Encounter (Signed)
I cannot comment on a patient I have never seen except to say that she could f/u with PCP

## 2016-01-22 NOTE — Telephone Encounter (Signed)
Called to make patient aware. No answer. LMOM.

## 2016-01-22 NOTE — Telephone Encounter (Signed)
FYI

## 2016-01-22 NOTE — Telephone Encounter (Signed)
Katie Stout 03-11-2046. She is a New Patient for Dr. Carles Collet on 02/15/16. She called in today because she was having a new symptom. She has been seeing a flash of light every other day, now it's every day. She is unsure if it's related to what she is coming in to see Dr. Carles Collet for. She said she also has contacted her [facility] as well.  Her number is [phone]. Thank  you

## 2016-01-25 DIAGNOSIS — H524 Presbyopia: Secondary | ICD-10-CM | POA: Diagnosis not present

## 2016-01-25 DIAGNOSIS — H26492 Other secondary cataract, left eye: Secondary | ICD-10-CM | POA: Diagnosis not present

## 2016-01-27 ENCOUNTER — Other Ambulatory Visit: Payer: Self-pay | Admitting: Internal Medicine

## 2016-01-29 ENCOUNTER — Ambulatory Visit (INDEPENDENT_AMBULATORY_CARE_PROVIDER_SITE_OTHER): Payer: Medicare Other | Admitting: Neurology

## 2016-01-29 ENCOUNTER — Encounter: Payer: Self-pay | Admitting: Neurology

## 2016-01-29 VITALS — BP 160/74 | HR 99 | Ht 63.5 in | Wt 193.0 lb

## 2016-01-29 DIAGNOSIS — G243 Spasmodic torticollis: Secondary | ICD-10-CM | POA: Diagnosis not present

## 2016-01-29 DIAGNOSIS — M12511 Traumatic arthropathy, right shoulder: Secondary | ICD-10-CM

## 2016-01-29 DIAGNOSIS — M5412 Radiculopathy, cervical region: Secondary | ICD-10-CM

## 2016-01-29 DIAGNOSIS — M542 Cervicalgia: Secondary | ICD-10-CM

## 2016-01-29 DIAGNOSIS — M12811 Other specific arthropathies, not elsewhere classified, right shoulder: Secondary | ICD-10-CM

## 2016-01-29 NOTE — Patient Instructions (Signed)
1. We will call to schedule Botox once we get approval from your insurance.  2. We have sent a referral to [facility] for your MRI and they will call you directly to schedule your appt. They are located at [place]. If you need to contact them directly please call [phone].

## 2016-01-29 NOTE — Progress Notes (Signed)
Katie Stout was seen today in neurologic consultation at the request of [name], MD.  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  The records that were made available to me were reviewed.  She has been seen by Dr. Gilford [name] at [facility] for years and has been getting botox for 17 years.  It appears that she was getting dysport but has been getting onobotulinum toxin type A as of late.  Her last injections were on 10/05/15.  States that the last injections were done differently because she was having some pain and after the injections she had pain down the right shoulder and neck.  She is having a lot of neck pain that starts in the right occiput and may radiate to the right shoulder but almost always goes to the right head and causes a headache.  It causes a shooting pain in the head but a "dull shooting pain."  It will hurt in the occiput to touch.  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  Also c/o significant R shoulder pain and cannot put her right arm behind her back without pain (do bra, etc).  Neuroimaging has previously been performed. 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  MRI brain done same time which was reported to show some T2 hyperintensities.  PREVIOUS MEDICATIONS:   ALLERGIES:   Allergies  Allergen Reactions  . Penicillins Hives  . Penicillin G     Other reaction(s): Other (See Comments) hives  . Adhesive [Tape] Other (See Comments)    SKIN REDNESS AND IRRITATION  . Bactrim [Sulfamethoxazole-Trimethoprim] Rash  . Codeine Nausea Only    CURRENT MEDICATIONS:  Outpatient Encounter Prescriptions as of 01/29/2016  Medication Sig  . ALPRAZolam (XANAX) 0.25 MG tablet Take 1 tablet (0.25 mg total) by mouth at bedtime as needed for sleep.  . calcium carbonate (TUMS CALCIUM FOR LIFE BONE) 750 MG chewable tablet Chew 2-4 tablets by mouth daily.    . cholecalciferol (VITAMIN D) 1000 UNITS tablet Take 1,000 Units by  mouth daily.    . fluticasone (FLONASE) 50 MCG/ACT nasal spray Place 2 sprays into both nostrils daily.  . hydrochlorothiazide (HYDRODIURIL) 25 MG tablet TAKE 1 TABLET EVERY DAY  . Multiple Vitamins-Minerals (CENTRUM SILVER 50+WOMEN) TABS Take 1 tablet by mouth daily.  . rosuvastatin (CRESTOR) 10 MG tablet TAKE 1 TABLET BY MOUTH DAILY.  . hyoscyamine (LEVSIN SL) 0.125 MG SL tablet Place 1 tablet (0.125 mg total) under the tongue every 4 (four) hours as needed for cramping. (Patient not taking: Reported on 01/29/2016)  . [DISCONTINUED] doxycycline (MONODOX) 100 MG capsule Take 1 capsule (100 mg total) by mouth 2 (two) times daily.  . [DISCONTINUED] tiZANidine (ZANAFLEX) 2 MG tablet Take 1 tablet (2 mg total) by mouth every 8 (eight) hours as needed for muscle spasms.   No facility-administered encounter medications on file as of 01/29/2016.     PAST MEDICAL HISTORY:   Past Medical History:  Diagnosis Date  . Anxiety   . Complication of anesthesia    states had flashing lights x 6 wks. post-op in 1992  . Dental crowns present   . Difficulty swallowing solids    states difficulty swallowing dry foods  . Diverticulosis of colon (without mention of hemorrhage)   . Eczema   . GERD (gastroesophageal reflux disease)   . H. pylori infection   . Hiatal hernia    states "small"  .  History of kidney stones   . Hypertension    under control, has been on med. x 7 yrs.  . 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 Other and unspecified hyperlipidemia   . Palpitations    "all my life" - states no problems; states had stress test 2006; no cardiologist  . Post-nasal drip 04/10/2012  . Rash    breasts  . Spasmodic torticollis    has difficulty turning head to left side; receives Botox injections    PAST SURGICAL HISTORY:   Past Surgical History:  Procedure Laterality Date  . ABDOMINAL HYSTERECTOMY  1993   complete  . CATARACT EXTRACTION, BILATERAL  2007  . CESAREAN SECTION    .  COLONOSCOPY  02/10/2013  . ESOPHAGOGASTRODUODENOSCOPY  02/10/2013  . ESOPHAGOSCOPY  04/14/2012   Procedure: ESOPHAGOSCOPY;  Surgeon: Rozetta Nunnery, MD;  Location: [place];  Service: ENT;  Laterality: Right;  . LARYNX SURGERY  11/2011   vocal cord biopsy  . NASAL HEMORRHAGE CONTROL  04/14/2012   Procedure: EPISTAXIS CONTROL;  Surgeon: Rozetta Nunnery, MD;  Location: [place];  Service: ENT;  Laterality: Right;  Cauterization of Right Septal Vessel  . OOPHORECTOMY  1992   x 1  . ORIF ULNAR FRACTURE  01/16/2000   left: also radial head dislocation  . 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 Marital status: Widowed    Spouse name: N/A  . Number of children: 1  . Years of education: N/A   Occupational History  . retired    Social History Main Topics  . Smoking status: Never Smoker  . Smokeless tobacco: Never Used  . Alcohol use No  . Drug use: No  . Sexual activity: Not on file   Other Topics Concern  . Not on file   Social History Narrative   HSG   Married '68-22 years; Divorced   1 Daughter - '85; no grandchildren   Work: PT for [employer]   Lives: Alone, but adjoins daughter    FAMILY HISTORY:   Family Status  Relation Status  . Mother Deceased at age 51   CA, breast  . Father Deceased at age 47   unknown cause of death; pneumonia  . Daughter Alive   [phone]  . Brother     ROS:  A complete 10 system review of systems was obtained and was unremarkable apart from what is mentioned above.  PHYSICAL EXAMINATION:    VITALS:   Vitals:   01/29/16 1234  BP: (!) 160/74  Pulse: 99  Weight: 193 lb (87.5 kg)  Height: 5' 3.5" (1.613 m)    GEN:  Normal appears female in no acute distress.  Appears stated age. HEENT:  Normocephalic, atraumatic. The mucous membranes are moist. The superficial temporal arteries are without  ropiness or tenderness.  Head is turned right and significantly side bent to the left, with the left ear approximating the left shoulder Cardiovascular: Regular rate and rhythm. Lungs: Clear to auscultation bilaterally. Neck/Heme: There are no carotid bruits noted bilaterally.  There is signficant Hypertrophy of the left sternocleidomastoid.  There is involvement of the left trapezius.  There is palpable hypertrophy of the left levator scapulae.  NEUROLOGICAL: Orientation:  The patient is alert and oriented x 3.  Fund of knowledge is appropriate.  Recent and remote memory intact.  Attention span and concentration  normal.  Repeats and names without difficulty. Cranial nerves: There is good facial symmetry. The pupils are equal round and reactive to light bilaterally. Fundoscopic exam reveals clear disc margins bilaterally. Extraocular muscles are intact and visual fields are full to confrontational testing. Speech is fluent and clear but she has vocal irritation. Soft palate rises symmetrically and there is no tongue deviation. Hearing is intact to conversational tone. Tone: Tone is good throughout. Sensation: Sensation is intact to light touch and pinprick throughout (facial, trunk, extremities). Vibration is intact at the bilateral big toe. There is no extinction with double simultaneous stimulation. There is no sensory dermatomal level identified. Coordination:  The patient has no difficulty with RAM's or FNF bilaterally. Motor: Strength is 5/5 in the bilateral upper and lower extremities.  She has difficulty abducting the right shoulder.  Shoulder shrug is equal and symmetric. There is no pronator drift.  There are no fasciculations noted. DTR's: Deep tendon reflexes are 2/4 at the bilateral biceps, triceps, brachioradialis, patella and achilles.  Plantar responses are downgoing bilaterally. Gait and Station: The patient is able to ambulate without difficulty. The patient is able to heel toe walk  without any difficulty. The patient has difficulty ambulating in a tandem fashion.. The patient is able to stand in the Romberg position.   IMPRESSION/PLAN  1.  Cervical dystonia  -This is very long-standing.  This involves the left sternocleidomastoid, left trapezius, right splenius capitis, left levator scapulae.  She was only using 100 units of Botox, which likely is under dosed.  We talked about risks and benefits of Botox, including the black box warning.  Discussed dysphagia.  She would like to try and get prior auth.  Her last botox was on 3/30  2.  Neck pain   -It sounds like this could be occipital neuralgia, but we will go ahead and do an MRI of the cervical spine, given that she is having pain radiating down the right arm.  If negative, we could always proceed with an occipital nerve block.  3.  R shoulder pain  -do MRI R shoulder.  Suspicous for rotator cuff tear.  4.  Much greater than 50% of this visit was spent in counseling and coordinating care.  Total face to face time:  60 min

## 2016-02-12 ENCOUNTER — Ambulatory Visit
Admission: RE | Admit: 2016-02-12 | Discharge: 2016-02-12 | Disposition: A | Discharge status: Home / Self Care | Payer: Medicare Other | Source: Ambulatory Visit | Attending: Neurology | Admitting: Neurology

## 2016-02-12 DIAGNOSIS — M12811 Other specific arthropathies, not elsewhere classified, right shoulder: Secondary | ICD-10-CM

## 2016-02-12 DIAGNOSIS — M50322 Other cervical disc degeneration at C5-C6 level: Secondary | ICD-10-CM | POA: Diagnosis not present

## 2016-02-12 DIAGNOSIS — M25511 Pain in right shoulder: Secondary | ICD-10-CM | POA: Diagnosis not present

## 2016-02-12 DIAGNOSIS — M5412 Radiculopathy, cervical region: Secondary | ICD-10-CM

## 2016-02-12 DIAGNOSIS — M542 Cervicalgia: Secondary | ICD-10-CM

## 2016-02-13 ENCOUNTER — Encounter: Payer: Self-pay | Admitting: Neurology

## 2016-02-13 ENCOUNTER — Telehealth: Payer: Self-pay | Admitting: Neurology

## 2016-02-13 NOTE — Telephone Encounter (Signed)
Called patient and made her aware of results of MRI's. She wants to think about occipital nerve block.  Records sent to Dr. Noemi Chapel for them to contact patient for appt (fax number [phone]). He has done surgery on her previously.   Botox approved and appt made.   Mychart message sent to patient with results we discussed per her request.

## 2016-02-13 NOTE — Telephone Encounter (Signed)
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  Could also contribute to the head ache/pain.  Could try occipital nerve block if head/neck pain no better.

## 2016-02-14 ENCOUNTER — Encounter (INDEPENDENT_AMBULATORY_CARE_PROVIDER_SITE_OTHER): Payer: Medicare Other | Admitting: Ophthalmology

## 2016-02-14 DIAGNOSIS — H35033 Hypertensive retinopathy, bilateral: Secondary | ICD-10-CM

## 2016-02-14 DIAGNOSIS — I10 Essential (primary) hypertension: Secondary | ICD-10-CM | POA: Diagnosis not present

## 2016-02-14 DIAGNOSIS — H43813 Vitreous degeneration, bilateral: Secondary | ICD-10-CM

## 2016-02-15 ENCOUNTER — Ambulatory Visit: Payer: Medicare Other | Admitting: Neurology

## 2016-02-27 DIAGNOSIS — S56912D Strain of unspecified muscles, fascia and tendons at forearm level, left arm, subsequent encounter: Secondary | ICD-10-CM | POA: Diagnosis not present

## 2016-03-04 DIAGNOSIS — M25611 Stiffness of right shoulder, not elsewhere classified: Secondary | ICD-10-CM | POA: Diagnosis not present

## 2016-03-04 DIAGNOSIS — M25511 Pain in right shoulder: Secondary | ICD-10-CM | POA: Diagnosis not present

## 2016-03-04 DIAGNOSIS — M75111 Incomplete rotator cuff tear or rupture of right shoulder, not specified as traumatic: Secondary | ICD-10-CM | POA: Diagnosis not present

## 2016-03-04 DIAGNOSIS — G243 Spasmodic torticollis: Secondary | ICD-10-CM | POA: Diagnosis not present

## 2016-03-06 DIAGNOSIS — M25511 Pain in right shoulder: Secondary | ICD-10-CM | POA: Diagnosis not present

## 2016-03-06 DIAGNOSIS — G243 Spasmodic torticollis: Secondary | ICD-10-CM | POA: Diagnosis not present

## 2016-03-06 DIAGNOSIS — M25611 Stiffness of right shoulder, not elsewhere classified: Secondary | ICD-10-CM | POA: Diagnosis not present

## 2016-03-06 DIAGNOSIS — M75111 Incomplete rotator cuff tear or rupture of right shoulder, not specified as traumatic: Secondary | ICD-10-CM | POA: Diagnosis not present

## 2016-03-07 DIAGNOSIS — H524 Presbyopia: Secondary | ICD-10-CM | POA: Diagnosis not present

## 2016-03-07 DIAGNOSIS — H26492 Other secondary cataract, left eye: Secondary | ICD-10-CM | POA: Diagnosis not present

## 2016-03-08 DIAGNOSIS — M75111 Incomplete rotator cuff tear or rupture of right shoulder, not specified as traumatic: Secondary | ICD-10-CM | POA: Diagnosis not present

## 2016-03-08 DIAGNOSIS — G243 Spasmodic torticollis: Secondary | ICD-10-CM | POA: Diagnosis not present

## 2016-03-08 DIAGNOSIS — M25611 Stiffness of right shoulder, not elsewhere classified: Secondary | ICD-10-CM | POA: Diagnosis not present

## 2016-03-08 DIAGNOSIS — M25511 Pain in right shoulder: Secondary | ICD-10-CM | POA: Diagnosis not present

## 2016-03-13 DIAGNOSIS — M25511 Pain in right shoulder: Secondary | ICD-10-CM | POA: Diagnosis not present

## 2016-03-13 DIAGNOSIS — G243 Spasmodic torticollis: Secondary | ICD-10-CM | POA: Diagnosis not present

## 2016-03-13 DIAGNOSIS — M25611 Stiffness of right shoulder, not elsewhere classified: Secondary | ICD-10-CM | POA: Diagnosis not present

## 2016-03-13 DIAGNOSIS — M75111 Incomplete rotator cuff tear or rupture of right shoulder, not specified as traumatic: Secondary | ICD-10-CM | POA: Diagnosis not present

## 2016-03-14 DIAGNOSIS — M25511 Pain in right shoulder: Secondary | ICD-10-CM | POA: Diagnosis not present

## 2016-03-14 DIAGNOSIS — G243 Spasmodic torticollis: Secondary | ICD-10-CM | POA: Diagnosis not present

## 2016-03-14 DIAGNOSIS — M25611 Stiffness of right shoulder, not elsewhere classified: Secondary | ICD-10-CM | POA: Diagnosis not present

## 2016-03-14 DIAGNOSIS — M75111 Incomplete rotator cuff tear or rupture of right shoulder, not specified as traumatic: Secondary | ICD-10-CM | POA: Diagnosis not present

## 2016-03-18 DIAGNOSIS — M25611 Stiffness of right shoulder, not elsewhere classified: Secondary | ICD-10-CM | POA: Diagnosis not present

## 2016-03-18 DIAGNOSIS — G243 Spasmodic torticollis: Secondary | ICD-10-CM | POA: Diagnosis not present

## 2016-03-18 DIAGNOSIS — M25511 Pain in right shoulder: Secondary | ICD-10-CM | POA: Diagnosis not present

## 2016-03-18 DIAGNOSIS — M75111 Incomplete rotator cuff tear or rupture of right shoulder, not specified as traumatic: Secondary | ICD-10-CM | POA: Diagnosis not present

## 2016-03-19 ENCOUNTER — Ambulatory Visit (INDEPENDENT_AMBULATORY_CARE_PROVIDER_SITE_OTHER): Payer: Medicare Other

## 2016-03-19 DIAGNOSIS — Z23 Encounter for immunization: Secondary | ICD-10-CM

## 2016-03-20 DIAGNOSIS — M75111 Incomplete rotator cuff tear or rupture of right shoulder, not specified as traumatic: Secondary | ICD-10-CM | POA: Diagnosis not present

## 2016-03-20 DIAGNOSIS — M25611 Stiffness of right shoulder, not elsewhere classified: Secondary | ICD-10-CM | POA: Diagnosis not present

## 2016-03-20 DIAGNOSIS — M25511 Pain in right shoulder: Secondary | ICD-10-CM | POA: Diagnosis not present

## 2016-03-20 DIAGNOSIS — G243 Spasmodic torticollis: Secondary | ICD-10-CM | POA: Diagnosis not present

## 2016-03-22 DIAGNOSIS — M75111 Incomplete rotator cuff tear or rupture of right shoulder, not specified as traumatic: Secondary | ICD-10-CM | POA: Diagnosis not present

## 2016-03-22 DIAGNOSIS — G243 Spasmodic torticollis: Secondary | ICD-10-CM | POA: Diagnosis not present

## 2016-03-22 DIAGNOSIS — M25611 Stiffness of right shoulder, not elsewhere classified: Secondary | ICD-10-CM | POA: Diagnosis not present

## 2016-03-22 DIAGNOSIS — M25511 Pain in right shoulder: Secondary | ICD-10-CM | POA: Diagnosis not present

## 2016-03-25 DIAGNOSIS — M75111 Incomplete rotator cuff tear or rupture of right shoulder, not specified as traumatic: Secondary | ICD-10-CM | POA: Diagnosis not present

## 2016-03-25 DIAGNOSIS — M25511 Pain in right shoulder: Secondary | ICD-10-CM | POA: Diagnosis not present

## 2016-03-25 DIAGNOSIS — M25611 Stiffness of right shoulder, not elsewhere classified: Secondary | ICD-10-CM | POA: Diagnosis not present

## 2016-03-25 DIAGNOSIS — G243 Spasmodic torticollis: Secondary | ICD-10-CM | POA: Diagnosis not present

## 2016-03-26 DIAGNOSIS — G243 Spasmodic torticollis: Secondary | ICD-10-CM | POA: Diagnosis not present

## 2016-03-28 DIAGNOSIS — M25611 Stiffness of right shoulder, not elsewhere classified: Secondary | ICD-10-CM | POA: Diagnosis not present

## 2016-03-28 DIAGNOSIS — M75111 Incomplete rotator cuff tear or rupture of right shoulder, not specified as traumatic: Secondary | ICD-10-CM | POA: Diagnosis not present

## 2016-03-28 DIAGNOSIS — G243 Spasmodic torticollis: Secondary | ICD-10-CM | POA: Diagnosis not present

## 2016-03-28 DIAGNOSIS — M25511 Pain in right shoulder: Secondary | ICD-10-CM | POA: Diagnosis not present

## 2016-03-29 ENCOUNTER — Telehealth: Payer: Self-pay | Admitting: Neurology

## 2016-03-29 ENCOUNTER — Ambulatory Visit (INDEPENDENT_AMBULATORY_CARE_PROVIDER_SITE_OTHER): Payer: Medicare Other | Admitting: Neurology

## 2016-03-29 DIAGNOSIS — G243 Spasmodic torticollis: Secondary | ICD-10-CM | POA: Diagnosis not present

## 2016-03-29 MED ORDER — ONABOTULINUMTOXINA 100 UNITS IJ SOLR
180.0000 [IU] | Freq: Once | INTRAMUSCULAR | Status: AC
  Administered 2016-03-29: 180 [IU] via INTRAMUSCULAR

## 2016-03-29 MED ORDER — ONABOTULINUMTOXINA 100 UNITS IJ SOLR: 200.0000 [IU] | Freq: Once | INTRAMUSCULAR | Status: DC

## 2016-03-29 NOTE — Telephone Encounter (Signed)
Patient wanted to make sure since her next Botox appt is a few days prior to 3 months that that would be okay. Patient aware that would be fine.

## 2016-03-29 NOTE — Telephone Encounter (Signed)
Katie Stout 2046/01/27. Her # is [id number] and cell [phone]. She has a question regarding her next botox appointment. Thank you

## 2016-03-29 NOTE — Telephone Encounter (Deleted)
Left message on machine that patient needs to call her PCP to discuss.

## 2016-03-29 NOTE — Procedures (Signed)
[facility]   Procedure Note Botox  Attending: Dr. Wells Guiles Ardella Chhim  Preoperative Diagnosis(es): Cervical Dystonia  Result History  Onset of effect: n/a  Duration of Benefit: n/a  Adverse Effects: n/a   Consent obtained from: The patient Benefits discussed included, but were not limited to decreased muscle tightness, increased joint range of motion, and decreased pain.  Risk discussed included, but were not limited pain and discomfort, bleeding, bruising, excessive weakness, venous thrombosis, muscle atrophy and dysphagia.  A copy of the patient medication guide was given to the patient which explains the blackbox warning.  Patients identity and treatment sites confirmed Yes.  .  Details of Procedure: Skin was cleaned with alcohol.  A 30 gauge, 1/2 inch needle was introduced to the target muscle (except splenius capitus, posterior approach, where 27 inch, 1 1/2 gauge needle was used).  Prior to injection, the needle plunger was aspirated to make sure the needle was not within a blood vessel.  There was no blood retrieved on aspiration.    Following is a summary of the muscles injected  And the amount of Botulinum toxin used:   Dilution 0.9% preservative free saline mixed with 100 u Botox type A to make 10 U per 0.1cc  Injections  Location Left  Right Units Number of sites        Sternocleidomastoid 50  50 1  Splenius Capitus, posterior approach  60 60 1  Splenius Capitus, lateral approach  40 40 1  Levator Scapulae      Trapezius 04/16/09  30 3         TOTAL UNITS:   180    Agent: Botulinum Type A ( Onobotulinum Toxin type A ).  2 vials of Botox were used, each containing 100 units and freshly diluted with 1 mL of sterile, non-preserved saline   Total injected (Units): 180  Total wasted (Units): 20   Pt tolerated procedure well without complications.   Reinjection is anticipated in 3 months.  Clinical note:  Was going to inject 20 into L levator scapulae but decided to  hold on that since she described dysphagia in the remote past.  May need to consider in future.

## 2016-03-29 NOTE — Telephone Encounter (Signed)
PT has a Botox question/Dawn CB# [phone]

## 2016-03-29 NOTE — Telephone Encounter (Signed)
Left message on machine for patient to call back.

## 2016-04-01 DIAGNOSIS — M75111 Incomplete rotator cuff tear or rupture of right shoulder, not specified as traumatic: Secondary | ICD-10-CM | POA: Diagnosis not present

## 2016-04-01 DIAGNOSIS — M25511 Pain in right shoulder: Secondary | ICD-10-CM | POA: Diagnosis not present

## 2016-04-01 DIAGNOSIS — G243 Spasmodic torticollis: Secondary | ICD-10-CM | POA: Diagnosis not present

## 2016-04-01 DIAGNOSIS — M25611 Stiffness of right shoulder, not elsewhere classified: Secondary | ICD-10-CM | POA: Diagnosis not present

## 2016-04-04 DIAGNOSIS — M75111 Incomplete rotator cuff tear or rupture of right shoulder, not specified as traumatic: Secondary | ICD-10-CM | POA: Diagnosis not present

## 2016-04-04 DIAGNOSIS — M25511 Pain in right shoulder: Secondary | ICD-10-CM | POA: Diagnosis not present

## 2016-04-04 DIAGNOSIS — M25611 Stiffness of right shoulder, not elsewhere classified: Secondary | ICD-10-CM | POA: Diagnosis not present

## 2016-04-04 DIAGNOSIS — G243 Spasmodic torticollis: Secondary | ICD-10-CM | POA: Diagnosis not present

## 2016-04-08 DIAGNOSIS — M25611 Stiffness of right shoulder, not elsewhere classified: Secondary | ICD-10-CM | POA: Diagnosis not present

## 2016-04-08 DIAGNOSIS — G243 Spasmodic torticollis: Secondary | ICD-10-CM | POA: Diagnosis not present

## 2016-04-08 DIAGNOSIS — M75111 Incomplete rotator cuff tear or rupture of right shoulder, not specified as traumatic: Secondary | ICD-10-CM | POA: Diagnosis not present

## 2016-04-08 DIAGNOSIS — M25511 Pain in right shoulder: Secondary | ICD-10-CM | POA: Diagnosis not present

## 2016-04-11 DIAGNOSIS — M25511 Pain in right shoulder: Secondary | ICD-10-CM | POA: Diagnosis not present

## 2016-04-11 DIAGNOSIS — M25611 Stiffness of right shoulder, not elsewhere classified: Secondary | ICD-10-CM | POA: Diagnosis not present

## 2016-04-11 DIAGNOSIS — M75111 Incomplete rotator cuff tear or rupture of right shoulder, not specified as traumatic: Secondary | ICD-10-CM | POA: Diagnosis not present

## 2016-04-11 DIAGNOSIS — G243 Spasmodic torticollis: Secondary | ICD-10-CM | POA: Diagnosis not present

## 2016-04-22 DIAGNOSIS — M25611 Stiffness of right shoulder, not elsewhere classified: Secondary | ICD-10-CM | POA: Diagnosis not present

## 2016-04-22 DIAGNOSIS — M75111 Incomplete rotator cuff tear or rupture of right shoulder, not specified as traumatic: Secondary | ICD-10-CM | POA: Diagnosis not present

## 2016-04-22 DIAGNOSIS — M25511 Pain in right shoulder: Secondary | ICD-10-CM | POA: Diagnosis not present

## 2016-04-22 DIAGNOSIS — G243 Spasmodic torticollis: Secondary | ICD-10-CM | POA: Diagnosis not present

## 2016-04-22 DIAGNOSIS — R490 Dysphonia: Secondary | ICD-10-CM | POA: Diagnosis not present

## 2016-04-25 DIAGNOSIS — M25611 Stiffness of right shoulder, not elsewhere classified: Secondary | ICD-10-CM | POA: Diagnosis not present

## 2016-04-25 DIAGNOSIS — G243 Spasmodic torticollis: Secondary | ICD-10-CM | POA: Diagnosis not present

## 2016-04-25 DIAGNOSIS — M75111 Incomplete rotator cuff tear or rupture of right shoulder, not specified as traumatic: Secondary | ICD-10-CM | POA: Diagnosis not present

## 2016-04-25 DIAGNOSIS — M25511 Pain in right shoulder: Secondary | ICD-10-CM | POA: Diagnosis not present

## 2016-05-10 NOTE — Progress Notes (Deleted)
Katie Stout was seen today in neurologic consultation at the request of Katie Calico, MD.  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  The records that were made available to me were reviewed.  She has been seen by Dr. Gilford [name] at [facility] for years and has been getting botox for 17 years.  It appears that she was getting dysport but has been getting onobotulinum toxin type A as of late.  Her last injections were on 10/05/15.  States that the last injections were done differently because she was having some pain and after the injections she had pain down the right shoulder and neck.  She is having a lot of neck pain that starts in the right occiput and may radiate to the right shoulder but almost always goes to the right head and causes a headache.  It causes a shooting pain in the head but a "dull shooting pain."  It will hurt in the occiput to touch.  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  Also c/o significant R shoulder pain and cannot put her right arm behind her back without pain (do bra, etc).  Neuroimaging has previously been performed. 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  MRI brain done same time which was reported to show some T2 hyperintensities.  05/13/16 update:  Patient returns for follow-up regarding her cervical dystonia.  She has long been receiving Botox, but had her first injections with me on 03/29/2016.  She had 180 units.  Patient denies any swallowing difficulties.  She denies any head heaviness.  She reports that ***.  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  I thought some of her pain was related to occipital neuralgia and told her that I was willing to do an occipital nerve block, but she decided to hold on that.   Today, she states that ***.  She also had an MRI of the shoulder since our last visit, which demonstrated Focal full-thickness tear of the superior aspect of the subscapularis tendon with subluxation of the severely degenerated long head of the biceps tendon into the tear.  She also had a musculotendinous tear of the infraspinatus.  Was referred to Raliegh Ip and she reports that ***.  PREVIOUS MEDICATIONS:   ALLERGIES:   Allergies  Allergen Reactions  . Penicillins Hives  . Penicillin G     Other reaction(s): Other (See Comments) hives  . Adhesive [Tape] Other (See Comments)    SKIN REDNESS AND IRRITATION  . Bactrim [Sulfamethoxazole-Trimethoprim] Rash  . Codeine Nausea Only    CURRENT MEDICATIONS:  Outpatient Encounter Prescriptions as of 05/13/2016  Medication Sig  . ALPRAZolam (XANAX) 0.25 MG tablet Take 1 tablet (0.25 mg total) by mouth at bedtime as needed for sleep.  . calcium carbonate (TUMS CALCIUM FOR LIFE BONE) 750 MG chewable tablet Chew 2-4 tablets by mouth daily.    . cholecalciferol (VITAMIN D) 1000 UNITS tablet Take 1,000 Units by mouth daily.    . fexofenadine (ALLEGRA) 30 MG tablet Take 30 mg by mouth 2 (two) times daily.  . hydrochlorothiazide (HYDRODIURIL) 25 MG tablet TAKE 1 TABLET EVERY DAY  . hyoscyamine (LEVSIN SL) 0.125 MG SL tablet Place 1 tablet (0.125 mg total)  under the tongue every 4 (four) hours as needed for cramping.  . Multiple Vitamins-Minerals (CENTRUM SILVER 50+WOMEN) TABS Take 1 tablet by mouth daily.  . naproxen sodium (ANAPROX) 220 MG tablet Take 220 mg by mouth 2 (two) times daily with a meal.  . rosuvastatin (CRESTOR) 10 MG tablet TAKE 1 TABLET BY MOUTH DAILY.   Facility-Administered Encounter Medications as of 05/13/2016  Medication  . botulinum toxin Type A (BOTOX) injection 200 Units    PAST MEDICAL HISTORY:   Past Medical History:  Diagnosis Date  . Anxiety   . Complication of anesthesia    states had flashing lights x 6 wks.  post-op in 1992  . Dental crowns present   . Difficulty swallowing solids    states difficulty swallowing dry foods  . Diverticulosis of colon (without mention of hemorrhage)   . Eczema   . GERD (gastroesophageal reflux disease)   . H. pylori infection   . Hiatal hernia    states "small"  . History of kidney stones   . Hypertension    under control, has been on med. x 7 yrs.  . 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 Other and unspecified hyperlipidemia   . Palpitations    "all my life" - states no problems; states had stress test 2006; no cardiologist  . Post-nasal drip 04/10/2012  . Rash    breasts  . Spasmodic torticollis    has difficulty turning head to left side; receives Botox injections    PAST SURGICAL HISTORY:   Past Surgical History:  Procedure Laterality Date  . ABDOMINAL HYSTERECTOMY  1993   complete  . CATARACT EXTRACTION, BILATERAL  2007  . CESAREAN SECTION    . COLONOSCOPY  02/10/2013  . ESOPHAGOGASTRODUODENOSCOPY  02/10/2013  . ESOPHAGOSCOPY  04/14/2012   Procedure: ESOPHAGOSCOPY;  Surgeon: Rozetta Nunnery, MD;  Location: [place];  Service: ENT;  Laterality: Right;  . LARYNX SURGERY  11/2011   vocal cord biopsy  . NASAL HEMORRHAGE CONTROL  04/14/2012   Procedure: EPISTAXIS CONTROL;  Surgeon: Rozetta Nunnery, MD;  Location: [place];  Service: ENT;  Laterality: Right;  Cauterization of Right Septal Vessel  . OOPHORECTOMY  1992   x 1  . ORIF ULNAR FRACTURE  01/16/2000   left: also radial head dislocation  . 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 Marital status: Widowed    Spouse name: N/A  . Number of children: 1  . Years of education: N/A   Occupational History  . retired    Social History Main Topics  . Smoking status: Never Smoker  . Smokeless tobacco: Never Used  . Alcohol use No  . Drug use: No  .  Sexual activity: Not on file   Other Topics Concern  . Not on file   Social History Narrative   HSG   Married '68-22 years; Divorced   1 Daughter - '85; no grandchildren   Work: PT for [employer]   Lives: Alone, but adjoins daughter    FAMILY HISTORY:   Family Status  Relation Status  . Mother Deceased at age 42   CA, breast  . Father Deceased at age 79   unknown cause of death; pneumonia  . Daughter Alive   [phone]  . Brother     ROS:  A complete 10 system review of systems was  obtained and was unremarkable apart from what is mentioned above.  PHYSICAL EXAMINATION:    VITALS:   There were no vitals filed for this visit.  GEN:  Normal appears female in no acute distress.  Appears stated age. HEENT:  Normocephalic, atraumatic. The mucous membranes are moist. The superficial temporal arteries are without ropiness or tenderness.  Head is turned right and significantly side bent to the left, with the left ear approximating the left shoulder Cardiovascular: Regular rate and rhythm. Lungs: Clear to auscultation bilaterally. Neck/Heme: There are no carotid bruits noted bilaterally.  There is signficant Hypertrophy of the left sternocleidomastoid.  There is involvement of the left trapezius.  There is palpable hypertrophy of the left levator scapulae.  NEUROLOGICAL: Orientation:  The patient is alert and oriented x 3.  Fund of knowledge is appropriate.  Recent and remote memory intact.  Attention span and concentration normal.  Repeats and names without difficulty. Cranial nerves: There is good facial symmetry. The pupils are equal round and reactive to light bilaterally. Fundoscopic exam reveals clear disc margins bilaterally. Extraocular muscles are intact and visual fields are full to confrontational testing. Speech is fluent and clear but she has vocal irritation. Soft palate rises symmetrically and there is no tongue deviation. Hearing is intact to conversational  tone. Tone: Tone is good throughout. Sensation: Sensation is intact to light touch and pinprick throughout (facial, trunk, extremities). Vibration is intact at the bilateral big toe. There is no extinction with double simultaneous stimulation. There is no sensory dermatomal level identified. Coordination:  The patient has no difficulty with RAM's or FNF bilaterally. Motor: Strength is 5/5 in the bilateral upper and lower extremities.  She has difficulty abducting the right shoulder.  Shoulder shrug is equal and symmetric. There is no pronator drift.  There are no fasciculations noted. DTR's: Deep tendon reflexes are 2/4 at the bilateral biceps, triceps, brachioradialis, patella and achilles.  Plantar responses are downgoing bilaterally. Gait and Station: The patient is able to ambulate without difficulty. The patient is able to heel toe walk without any difficulty. The patient has difficulty ambulating in a tandem fashion.. The patient is able to stand in the Romberg position.   IMPRESSION/PLAN  1.  Cervical dystonia  -This is very long-standing.  She has been doing botox for a long time but first botox with me was on 03/29/16  2.  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    3.  R rotator cuff tear  -seen at [facility] and ***.  4.  Much greater than 50% of this visit was spent in counseling and coordinating care.  Total face to face time:  60 min

## 2016-05-13 ENCOUNTER — Ambulatory Visit (INDEPENDENT_AMBULATORY_CARE_PROVIDER_SITE_OTHER): Payer: Medicare Other | Admitting: Internal Medicine

## 2016-05-13 ENCOUNTER — Encounter: Payer: Self-pay | Admitting: Internal Medicine

## 2016-05-13 ENCOUNTER — Ambulatory Visit: Payer: Medicare Other | Admitting: Neurology

## 2016-05-13 VITALS — BP 140/80 | HR 102 | Temp 98.4°F | Resp 20

## 2016-05-13 DIAGNOSIS — R062 Wheezing: Secondary | ICD-10-CM | POA: Diagnosis not present

## 2016-05-13 DIAGNOSIS — I10 Essential (primary) hypertension: Secondary | ICD-10-CM

## 2016-05-13 DIAGNOSIS — K219 Gastro-esophageal reflux disease without esophagitis: Secondary | ICD-10-CM

## 2016-05-13 DIAGNOSIS — J069 Acute upper respiratory infection, unspecified: Secondary | ICD-10-CM | POA: Diagnosis not present

## 2016-05-13 MED ORDER — DOXYCYCLINE HYCLATE 100 MG PO TABS: 100.0000 mg | ORAL_TABLET | Freq: Two times a day (BID) | ORAL | 0 refills | Status: DC

## 2016-05-13 MED ORDER — HYOSCYAMINE SULFATE 0.125 MG SL SUBL: 0.1250 mg | SUBLINGUAL_TABLET | SUBLINGUAL | 4 refills | Status: DC | PRN

## 2016-05-13 MED ORDER — PREDNISONE 10 MG PO TABS: ORAL_TABLET | ORAL | 0 refills | Status: DC

## 2016-05-13 MED ORDER — ALBUTEROL SULFATE HFA 108 (90 BASE) MCG/ACT IN AERS: 2.0000 | INHALATION_SPRAY | Freq: Four times a day (QID) | RESPIRATORY_TRACT | 1 refills | Status: DC | PRN

## 2016-05-13 MED ORDER — ALPRAZOLAM 0.25 MG PO TABS: 0.2500 mg | ORAL_TABLET | Freq: Every evening | ORAL | 2 refills | Status: DC | PRN

## 2016-05-13 NOTE — Patient Instructions (Addendum)
Please take all new medication as prescribed - the antibiotic, prednisone and the inhaler as needed  You can also take Delsym OTC for cough, and/or Mucinex (or it's generic off brand) for congestion, and tylenol as needed for pain.  Please continue all other medications as before, and refills have been done if requested.  Please have the pharmacy call with any other refills you may need.  Please keep your appointments with your specialists as you may have planned

## 2016-05-13 NOTE — Progress Notes (Signed)
Subjective:    Patient ID: Katie Stout, female    DOB: 09-25-1945, 70 y.o.   MRN: [id number]  HPI   Here with acute onset mild to mod 5 days ST, HA, general weakness and malaise, with prod cough greenish sputum,hoarseness, tinnitus and chills with temp over 100.4 but Pt denies chest pain, orthopnea, PND, increased LE swelling, palpitations, dizziness or syncope, except for mild worsening wheezing and sob  Also Has known amyloid growth on the vocal cord which makes her concerned about getting worse; Denies worsening reflux, abd pain, dysphagia, n/v, bowel change or blood. Past Medical History:  Diagnosis Date  . Anxiety   . Complication of anesthesia    states had flashing lights x 6 wks. post-op in 1992  . Dental crowns present   . Difficulty swallowing solids    states difficulty swallowing dry foods  . Diverticulosis of colon (without mention of hemorrhage)   . Eczema   . GERD (gastroesophageal reflux disease)   . H. pylori infection   . Hiatal hernia    states "small"  . History of kidney stones   . Hypertension    under control, has been on med. x 7 yrs.  . 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 Other and unspecified hyperlipidemia   . Palpitations    "all my life" - states no problems; states had stress test 2006; no cardiologist  . Post-nasal drip 04/10/2012  . Rash    breasts  . Spasmodic torticollis    has difficulty turning head to left side; receives Botox injections   Past Surgical History:  Procedure Laterality Date  . ABDOMINAL HYSTERECTOMY  1993   complete  . CATARACT EXTRACTION, BILATERAL  2007  . CESAREAN SECTION    . COLONOSCOPY  02/10/2013  . ESOPHAGOGASTRODUODENOSCOPY  02/10/2013  . ESOPHAGOSCOPY  04/14/2012   Procedure: ESOPHAGOSCOPY;  Surgeon: Rozetta Nunnery, MD;  Location: [place];  Service: ENT;  Laterality: Right;  . LARYNX SURGERY  11/2011   vocal cord biopsy  . NASAL HEMORRHAGE CONTROL  04/14/2012   Procedure:  EPISTAXIS CONTROL;  Surgeon: Rozetta Nunnery, MD;  Location: [place];  Service: ENT;  Laterality: Right;  Cauterization of Right Septal Vessel  . OOPHORECTOMY  1992   x 1  . ORIF ULNAR FRACTURE  01/16/2000   left: also radial head dislocation  . TRANSPHENOIDAL / TRANSNASAL HYPOPHYSECTOMY / RESECTION PITUITARY TUMOR  1983    reports that she has never smoked. She has never used smokeless tobacco. She reports that she does not drink alcohol or use drugs. family history includes AAA (abdominal aortic aneurysm) in her brother; Breast cancer in her mother; Colon cancer in her mother; Coronary artery disease in her mother; Heart attack in her mother; Hypertension in her father; Other in her father; Prostate cancer in her brother; Thrombocytopenia in her father. Allergies  Allergen Reactions  . Penicillins Hives  . Penicillin G     Other reaction(s): Other (See Comments) hives  . Adhesive [Tape] Other (See Comments)    SKIN REDNESS AND IRRITATION  . Bactrim [Sulfamethoxazole-Trimethoprim] Rash  . Codeine Nausea Only   Current Outpatient Prescriptions on File Prior to Visit  Medication Sig Dispense Refill  . calcium carbonate (TUMS CALCIUM FOR LIFE BONE) 750 MG chewable tablet Chew 2-4 tablets by mouth daily.      . cholecalciferol (VITAMIN D) 1000 UNITS tablet Take 1,000 Units by mouth daily.      [name]  fexofenadine (ALLEGRA) 30 MG tablet Take 30 mg by mouth 2 (two) times daily.    . hydrochlorothiazide (HYDRODIURIL) 25 MG tablet TAKE 1 TABLET EVERY DAY 90 tablet 1  . Multiple Vitamins-Minerals (CENTRUM SILVER 50+WOMEN) TABS Take 1 tablet by mouth daily.    . naproxen sodium (ANAPROX) 220 MG tablet Take 220 mg by mouth 2 (two) times daily with a meal.    . rosuvastatin (CRESTOR) 10 MG tablet TAKE 1 TABLET BY MOUTH DAILY. 90 tablet 3   Current Facility-Administered Medications on File Prior to Visit  Medication Dose Route Frequency Provider Last Rate Last Dose  . botulinum  toxin Type A (BOTOX) injection 200 Units  200 Units Intramuscular Once Rebecca S Tat, DO       Review of Systems  Constitutional: Negative for unusual diaphoresis or night sweats HENT: Negative for ear swelling or discharge Eyes: Negative for worsening visual haziness  Respiratory: Negative for choking and stridor.   Gastrointestinal: Negative for distension or worsening eructation Genitourinary: Negative for retention or change in urine volume.  Musculoskeletal: Negative for other MSK pain or swelling Skin: Negative for color change and worsening wound Neurological: Negative for tremors and numbness other than noted  Psychiatric/Behavioral: Negative for decreased concentration or agitation other than above   All other system neg per pt    Objective:   Physical Exam BP 140/80   Pulse (!) 102   Temp 98.4 F (36.9 C) (Oral)   Resp 20   SpO2 94%  VS noted, mild ill appearing Constitutional: Pt appears in no apparent distress HENT: Head: NCAT.  Right Ear: External ear normal.  Left Ear: External ear normal.  Eyes: . Pupils are equal, round, and reactive to light. Conjunctivae and EOM are normal Bilat tm's with mild erythema.  Max sinus areas non tender.  Pharynx with mild erythema, no exudate Neck: Normal range of motion. Neck supple. with bilat submandibular tender LA Cardiovascular: Normal rate and regular rhythm.   Pulmonary/Chest: Effort normal and breath sounds without rales but with few bilat scattered wheezing.  Neurological: Pt is alert. Not confused , motor grossly intact Skin: Skin is warm. No rash, no LE edema Psychiatric: Pt behavior is normal. No agitation.     Assessment & Plan:

## 2016-05-13 NOTE — Progress Notes (Signed)
Pre visit review using our clinic review tool, if applicable. No additional management support is needed unless otherwise documented below in the visit note. 

## 2016-05-20 ENCOUNTER — Encounter: Payer: Self-pay | Admitting: Neurology

## 2016-05-20 DIAGNOSIS — R062 Wheezing: Secondary | ICD-10-CM | POA: Insufficient documentation

## 2016-05-20 NOTE — Assessment & Plan Note (Signed)
stable overall by history and exam, recent data reviewed with pt, and pt to continue medical treatment as before,  to f/u any worsening symptoms or concerns Lab Results  Component Value Date   WBC 11.0 (H) 01/10/2016   HGB 14.4 01/10/2016   HCT 42.8 01/10/2016   PLT 233.0 01/10/2016   GLUCOSE 96 01/10/2016   CHOL 170 01/10/2016   TRIG 93.0 01/10/2016   HDL 67.30 01/10/2016   LDLDIRECT 132.2 06/10/2012   LDLCALC 84 01/10/2016   ALT 21 10/25/2015   AST 20 10/25/2015   NA 140 01/10/2016   K 3.9 01/10/2016   CL 100 01/10/2016   CREATININE 0.59 01/10/2016   BUN 14 01/10/2016   CO2 34 (H) 01/10/2016   TSH 3.68 01/10/2016   HGBA1C 5.9 12/26/2006

## 2016-05-20 NOTE — Assessment & Plan Note (Signed)
stable overall by history and exam, recent data reviewed with pt, and pt to continue medical treatment as before,  to f/u any worsening symptoms or concerns BP Readings from Last 3 Encounters:  05/13/16 140/80  01/29/16 (!) 160/74  01/10/16 140/74

## 2016-05-20 NOTE — Assessment & Plan Note (Signed)
Mild to mod, c/w bronchospasm, for predpac asd, inhaler prn, delsym prn,  to f/u any worsening symptoms or concerns

## 2016-05-20 NOTE — Assessment & Plan Note (Signed)
Mild to mod, for antibx course,  to f/u any worsening symptoms or concerns 

## 2016-05-23 ENCOUNTER — Ambulatory Visit (INDEPENDENT_AMBULATORY_CARE_PROVIDER_SITE_OTHER)
Admission: RE | Admit: 2016-05-23 | Discharge: 2016-05-23 | Disposition: A | Discharge status: Home / Self Care | Payer: Medicare Other | Source: Ambulatory Visit | Attending: Nurse Practitioner | Admitting: Nurse Practitioner

## 2016-05-23 ENCOUNTER — Ambulatory Visit (INDEPENDENT_AMBULATORY_CARE_PROVIDER_SITE_OTHER): Payer: Medicare Other | Admitting: Nurse Practitioner

## 2016-05-23 ENCOUNTER — Encounter: Payer: Self-pay | Admitting: Nurse Practitioner

## 2016-05-23 VITALS — BP 146/86 | HR 106 | Temp 98.6°F | Wt 194.0 lb

## 2016-05-23 DIAGNOSIS — R0602 Shortness of breath: Secondary | ICD-10-CM

## 2016-05-23 DIAGNOSIS — J209 Acute bronchitis, unspecified: Secondary | ICD-10-CM

## 2016-05-23 DIAGNOSIS — R05 Cough: Secondary | ICD-10-CM | POA: Diagnosis not present

## 2016-05-23 MED ORDER — GUAIFENESIN-DM 100-10 MG/5ML PO SYRP: 10.0000 mL | ORAL_SOLUTION | Freq: Four times a day (QID) | ORAL | 0 refills | Status: DC | PRN

## 2016-05-23 MED ORDER — BENZONATATE 100 MG PO CAPS: 100.0000 mg | ORAL_CAPSULE | Freq: Three times a day (TID) | ORAL | 0 refills | Status: DC | PRN

## 2016-05-23 MED ORDER — METHYLPREDNISOLONE ACETATE 40 MG/ML IJ SUSP
40.0000 mg | Freq: Once | INTRAMUSCULAR | Status: AC
  Administered 2016-05-23: 40 mg via INTRAMUSCULAR

## 2016-05-23 NOTE — Progress Notes (Signed)
Subjective:  Patient ID: Katie Stout, female    DOB: [birth year]  Age: 70 y.o. MRN: [id number]  CC: Breathing Problem (Pt stated having breathing problem, coughing, body ache, chills, for 3 weeks)   Breathing Problem  She complains of chest tightness, cough, difficulty breathing, hoarse voice, shortness of breath, sputum production and wheezing. This is a new problem. The current episode started 1 to 4 weeks ago. The problem occurs constantly. The problem has been unchanged. The cough is productive of sputum and productive. Associated symptoms include dyspnea on exertion, headaches, malaise/fatigue, nasal congestion, postnasal drip, rhinorrhea and a sore throat. Pertinent negatives include no appetite change, fever, heartburn, orthopnea, PND or trouble swallowing. Her symptoms are aggravated by URI and lying down. Her symptoms are alleviated by rest, OTC cough suppressant, oral steroids and beta-agonist (and doxycycline). She reports minimal improvement on treatment. Her symptoms are not alleviated by OTC cough suppressant. There are no known risk factors for lung disease.    Outpatient Medications Prior to Visit  Medication Sig Dispense Refill  . albuterol (PROVENTIL HFA;VENTOLIN HFA) 108 (90 Base) MCG/ACT inhaler Inhale 2 puffs into the lungs every 6 (six) hours as needed for wheezing or shortness of breath. 1 Inhaler 1  . ALPRAZolam (XANAX) 0.25 MG tablet Take 1 tablet (0.25 mg total) by mouth at bedtime as needed for sleep. 30 tablet 2  . calcium carbonate (TUMS CALCIUM FOR LIFE BONE) 750 MG chewable tablet Chew 2-4 tablets by mouth daily.      . cholecalciferol (VITAMIN D) 1000 UNITS tablet Take 1,000 Units by mouth daily.      . fexofenadine (ALLEGRA) 30 MG tablet Take 30 mg by mouth 2 (two) times daily.    . hydrochlorothiazide (HYDRODIURIL) 25 MG tablet TAKE 1 TABLET EVERY DAY 90 tablet 1  . hyoscyamine (LEVSIN SL) 0.125 MG SL tablet Place 1 tablet (0.125 mg total) under the tongue  every 4 (four) hours as needed for cramping. 30 tablet 4  . Multiple Vitamins-Minerals (CENTRUM SILVER 50+WOMEN) TABS Take 1 tablet by mouth daily.    . naproxen sodium (ANAPROX) 220 MG tablet Take 220 mg by mouth 2 (two) times daily with a meal.    . rosuvastatin (CRESTOR) 10 MG tablet TAKE 1 TABLET BY MOUTH DAILY. 90 tablet 3  . doxycycline (VIBRA-TABS) 100 MG tablet Take 1 tablet (100 mg total) by mouth 2 (two) times daily. 20 tablet 0  . predniSONE (DELTASONE) 10 MG tablet 2 tabs by mouth per day for 5 days 10 tablet 0   Facility-Administered Medications Prior to Visit  Medication Dose Route Frequency Provider Last Rate Last Dose  . botulinum toxin Type A (BOTOX) injection 200 Units  200 Units Intramuscular Once Rebecca S Tat, DO        ROS See HPI  Objective:  BP (!) 146/86 (BP Location: Left Arm, Patient Position: Sitting, Cuff Size: Large)   Pulse (!) 106   Temp 98.6 F (37 C)   Wt 194 lb (88 kg)   SpO2 94%   BMI 33.83 kg/m   BP Readings from Last 3 Encounters:  05/23/16 (!) 146/86  05/13/16 140/80  01/29/16 (!) 160/74    Wt Readings from Last 3 Encounters:  05/23/16 194 lb (88 kg)  01/29/16 193 lb (87.5 kg)  01/10/16 192 lb (87.1 kg)    Physical Exam  Constitutional: She is oriented to person, place, and time.  HENT:  Right Ear: External ear normal. No mastoid tenderness. Tympanic membrane is  not erythematous. A middle ear effusion is present.  Left Ear: External ear normal. No mastoid tenderness. Tympanic membrane is not erythematous. A middle ear effusion is present.  Mouth/Throat: Uvula is midline. Posterior oropharyngeal erythema present. No oropharyngeal exudate.  Neck: Neck supple.  Cardiovascular: Regular rhythm and normal heart sounds.   Pulmonary/Chest: Effort normal and breath sounds normal. No respiratory distress.  Musculoskeletal: She exhibits no edema.  Neurological: She is alert and oriented to person, place, and time.  Skin: Skin is warm and  dry.    Lab Results  Component Value Date   WBC 11.0 (H) 01/10/2016   HGB 14.4 01/10/2016   HCT 42.8 01/10/2016   PLT 233.0 01/10/2016   GLUCOSE 96 01/10/2016   CHOL 170 01/10/2016   TRIG 93.0 01/10/2016   HDL 67.30 01/10/2016   LDLDIRECT 132.2 06/10/2012   LDLCALC 84 01/10/2016   ALT 21 10/25/2015   AST 20 10/25/2015   NA 140 01/10/2016   K 3.9 01/10/2016   CL 100 01/10/2016   CREATININE 0.59 01/10/2016   BUN 14 01/10/2016   CO2 34 (H) 01/10/2016   TSH 3.68 01/10/2016   HGBA1C 5.9 12/26/2006    Mr Cervical Spine Wo Contrast  Result Date: 02/12/2016 CLINICAL DATA:  Chronic neck pain. Right shoulder and neck pain have progressed over the last 6-8 months. EXAM: MRI CERVICAL SPINE WITHOUT CONTRAST TECHNIQUE: Multiplanar, multisequence MR imaging of the cervical spine was performed. No intravenous contrast was administered. COMPARISON:  CT scan dated 03/10/2015 power radiographs dated 02/19/2014 and MRI dated 09/27/2014 FINDINGS: Alignment: Chronic 2 mm retrolisthesis of C5 on C6. Vertebrae: No fractures or bone destruction. Prominent cyst in the posterior aspect of C6 to the left of midline containing gas. Cord: Normal. Posterior Fossa, vertebral arteries, paraspinal tissues: Normal. 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 Normal disc at C2-3. Moderate chronic degenerative changes between the anterior arch of C1 and the odontoid process. Degenerative changes of the right lateral mass of C1. C3-4: Auto fusion of the facet joints.  The disc is normal. C4-5: Normal disc. Moderate left facet arthritis and slight right facet arthritis. No foraminal stenosis. C5-6: Chronic degenerative disc disease with a small broad-based disc osteophyte complex which extends into both lateral recesses without severe foraminal stenosis. C6-7: Chronic degenerative disc disease with a vacuum phenomenon with gas in the degenerative cyst in the posterior aspect of C6.  This has progressed since the prior CT scan. Uncinate spurs narrow the right neural foramen. C7-T1:  Slight left facet arthritis.  Normal disc. IMPRESSION: 1. 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 2. Chronic degenerative disc disease primarily at C5-6 and C6-7 with narrowing of both lateral recesses at C5-6 which could affect the C6 nerves. 3. Moderate degenerative changes between the anterior arch of C1 and the odontoid process. Electronically Signed   By: Lorriane Shire M.D.   On: 02/12/2016 16:38   Mr Shoulder Right Wo Contrast  Result Date: 02/12/2016 CLINICAL DATA:  Progressive right shoulder pain for 6-8 months. EXAM: MRI OF THE RIGHT SHOULDER WITHOUT CONTRAST TECHNIQUE: Multiplanar, multisequence MR imaging of the shoulder was performed. No intravenous contrast was administered. COMPARISON:  None. FINDINGS: Rotator cuff: There is an intrasubstance tear at the musculotendinous junction of the infraspinatus which does not appear to extend to the articular or bursal surfaces. This is best seen on series 7. Chronic degenerative changes and hypertrophy of the distal supraspinous tendon. Focal full-thickness tear of the superior fibers of  the subscapularis best seen on images 12 of series 7. The long head of the biceps tendon has subluxed into the torn subscapularis. Muscles:  Normal. Biceps long head: Diffuse degeneration of the intra-articular portion of the long head of the biceps tendon. The tendon is hypertrophied and frayed at the superior aspect of the bicipital groove. Acromioclavicular Joint: Minimal arthropathy. Normal type 2 acromion. Slight inflammation of the subacromial/subdeltoid bursa. Glenohumeral Joint: Small joint effusion. Labrum:  Normal. Bones: Small degenerative changes of the greater and lesser tuberosities of the proximal humerus. IMPRESSION: Focal full-thickness tear of the superior aspect of the subscapularis tendon with subluxation of the severely degenerated long head of the biceps  tendon into the tear. Intrasubstance musculotendinous tear of the infraspinatus. Electronically Signed   By: Lorriane Shire M.D.   On: 02/12/2016 16:51    Assessment & Plan:   Katie Stout was seen today for breathing problem.  Diagnoses and all orders for this visit:  Acute bronchitis, unspecified organism -     methylPREDNISolone acetate (DEPO-MEDROL) injection 40 mg; Inject 1 mL (40 mg total) into the muscle once. -     DG Chest 2 View; Future -     benzonatate (TESSALON) 100 MG capsule; Take 1 capsule (100 mg total) by mouth 3 (three) times daily as needed for cough. -     guaiFENesin-dextromethorphan (ROBITUSSIN DM) 100-10 MG/5ML syrup; Take 10 mLs by mouth every 6 (six) hours as needed for cough.  SOB (shortness of breath) on exertion -     methylPREDNISolone acetate (DEPO-MEDROL) injection 40 mg; Inject 1 mL (40 mg total) into the muscle once. -     DG Chest 2 View; Future -     benzonatate (TESSALON) 100 MG capsule; Take 1 capsule (100 mg total) by mouth 3 (three) times daily as needed for cough. -     guaiFENesin-dextromethorphan (ROBITUSSIN DM) 100-10 MG/5ML syrup; Take 10 mLs by mouth every 6 (six) hours as needed for cough.   I have discontinued Katie Stout's doxycycline and predniSONE. I am also having her start on benzonatate and guaiFENesin-dextromethorphan. Additionally, I am having her maintain her calcium carbonate, cholecalciferol, rosuvastatin, CENTRUM SILVER 50+WOMEN, hydrochlorothiazide, fexofenadine, naproxen sodium, ALPRAZolam, hyoscyamine, and albuterol. We administered methylPREDNISolone acetate. We will continue to administer botulinum toxin Type A.  Meds ordered this encounter  Medications  . methylPREDNISolone acetate (DEPO-MEDROL) injection 40 mg  . benzonatate (TESSALON) 100 MG capsule    Sig: Take 1 capsule (100 mg total) by mouth 3 (three) times daily as needed for cough.    Dispense:  20 capsule    Refill:  0    Order Specific Question:   Supervising Provider     Answer:   Cassandria Anger [[id number]]  . guaiFENesin-dextromethorphan (ROBITUSSIN DM) 100-10 MG/5ML syrup    Sig: Take 10 mLs by mouth every 6 (six) hours as needed for cough.    Dispense:  118 mL    Refill:  0    Order Specific Question:   Supervising Provider    Answer:   Cassandria Anger [[id number]]    Follow-up: Return if symptoms worsen or fail to improve.  Wilfred Lacy, NP

## 2016-05-23 NOTE — Patient Instructions (Addendum)
Encourage adequate oral hydration Continue use of ventolin inhaler and robitussin or tussin as prescribed. Normal CXR.

## 2016-05-23 NOTE — Progress Notes (Signed)
Pre visit review using our clinic review tool, if applicable. No additional management support is needed unless otherwise documented below in the visit note. 

## 2016-05-27 NOTE — Progress Notes (Signed)
Katie Stout was seen today in neurologic consultation at the request of [name], MD.  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  The records that were made available to me were reviewed.  She has been seen by Dr. Gilford [name] at [facility] for years and has been getting botox for 17 years.  It appears that she was getting dysport but has been getting onobotulinum toxin type A as of late.  Her last injections were on 10/05/15.  States that the last injections were done differently because she was having some pain and after the injections she had pain down the right shoulder and neck.  She is having a lot of neck pain that starts in the right occiput and may radiate to the right shoulder but almost always goes to the right head and causes a headache.  It causes a shooting pain in the head but a "dull shooting pain."  It will hurt in the occiput to touch.  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  Also c/o significant R shoulder pain and cannot put her right arm behind her back without pain (do bra, etc).  Neuroimaging has previously been performed. 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  MRI brain done same time which was reported to show some T2 hyperintensities.  05/28/16 update:  Patient returns for follow-up regarding her cervical dystonia.  She has long been receiving Botox, but had her first injections with me on 03/29/2016.  She had 180 units.  Patient denies any swallowing difficulties.  She denies any head heaviness.  She reports that her head is much straighter but she is having pain down the right neck.   Aleve helps but she doesn't like to take it.   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  I thought some of her pain was related to  occipital neuralgia and told her that I was willing to do an occipital nerve block, but she decided to hold on that.  She also had an MRI of the shoulder since our last visit, which demonstrated Focal full-thickness tear of the superior aspect of the subscapularis tendon with subluxation of the severely degenerated long head of the biceps tendon into the tear.  She also had a musculotendinous tear of the infraspinatus.  Was referred to Raliegh Ip and she reports that she was sent to PT and she feels that she got stronger through PT.  PT also worked on her shoulder but she only had short term benefit with it so the PT released her from that.  PREVIOUS MEDICATIONS:   ALLERGIES:   Allergies  Allergen Reactions  . Penicillins Hives  . Penicillin G     Other reaction(s): Other (See Comments) hives  . Adhesive [Tape] Other (See Comments)    SKIN REDNESS AND IRRITATION  . Bactrim [Sulfamethoxazole-Trimethoprim] Rash  . Codeine Nausea Only    CURRENT MEDICATIONS:  Outpatient Encounter Prescriptions as of 05/28/2016  Medication Sig  . albuterol (PROVENTIL HFA;VENTOLIN HFA) 108 (90 Base) MCG/ACT inhaler Inhale 2 puffs into the lungs every 6 (six) hours as needed for wheezing or shortness of breath.  . ALPRAZolam (XANAX) 0.25 MG tablet Take 1 tablet (0.25 mg total) by mouth at bedtime  as needed for sleep.  . benzonatate (TESSALON) 100 MG capsule Take 1 capsule (100 mg total) by mouth 3 (three) times daily as needed for cough.  . calcium carbonate (TUMS CALCIUM FOR LIFE BONE) 750 MG chewable tablet Chew 2-4 tablets by mouth daily.    . cholecalciferol (VITAMIN D) 1000 UNITS tablet Take 1,000 Units by mouth daily.    . fexofenadine (ALLEGRA) 30 MG tablet Take 30 mg by mouth 2 (two) times daily.  [name] guaiFENesin-dextromethorphan (ROBITUSSIN DM) 100-10 MG/5ML syrup Take 10 mLs by mouth every 6 (six) hours as needed for cough.  . hydrochlorothiazide (HYDRODIURIL) 25 MG tablet TAKE 1 TABLET EVERY DAY  .  hyoscyamine (LEVSIN SL) 0.125 MG SL tablet Place 1 tablet (0.125 mg total) under the tongue every 4 (four) hours as needed for cramping.  . Multiple Vitamins-Minerals (CENTRUM SILVER 50+WOMEN) TABS Take 1 tablet by mouth daily.  . naproxen sodium (ANAPROX) 220 MG tablet Take 220 mg by mouth 2 (two) times daily with a meal.  . rosuvastatin (CRESTOR) 10 MG tablet TAKE 1 TABLET BY MOUTH DAILY.   Facility-Administered Encounter Medications as of 05/28/2016  Medication  . botulinum toxin Type A (BOTOX) injection 200 Units    PAST MEDICAL HISTORY:   Past Medical History:  Diagnosis Date  . Anxiety   . Complication of anesthesia    states had flashing lights x 6 wks. post-op in 1992  . Dental crowns present   . Difficulty swallowing solids    states difficulty swallowing dry foods  . Diverticulosis of colon (without mention of hemorrhage)   . Eczema   . GERD (gastroesophageal reflux disease)   . H. pylori infection   . Hiatal hernia    states "small"  . History of kidney stones   . Hypertension    under control, has been on med. x 7 yrs.  . 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 Other and unspecified hyperlipidemia   . Palpitations    "all my life" - states no problems; states had stress test 2006; no cardiologist  . Post-nasal drip 04/10/2012  . Rash    breasts  . Spasmodic torticollis    has difficulty turning head to left side; receives Botox injections    PAST SURGICAL HISTORY:   Past Surgical History:  Procedure Laterality Date  . ABDOMINAL HYSTERECTOMY  1993   complete  . CATARACT EXTRACTION, BILATERAL  2007  . CESAREAN SECTION    . COLONOSCOPY  02/10/2013  . ESOPHAGOGASTRODUODENOSCOPY  02/10/2013  . ESOPHAGOSCOPY  04/14/2012   Procedure: ESOPHAGOSCOPY;  Surgeon: Rozetta Nunnery, MD;  Location: [facility];  Service: ENT;  Laterality: Right;  . LARYNX SURGERY  11/2011   vocal cord biopsy  . NASAL HEMORRHAGE CONTROL  04/14/2012   Procedure:  EPISTAXIS CONTROL;  Surgeon: Rozetta Nunnery, MD;  Location: [facility];  Service: ENT;  Laterality: Right;  Cauterization of Right Septal Vessel  . OOPHORECTOMY  1992   x 1  . ORIF ULNAR FRACTURE  01/16/2000   left: also radial head dislocation  . 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 Marital status: Widowed    Spouse name: N/A  . Number of children: 1  . Years of education: N/A   Occupational History  . retired    Social History Main Topics  . Smoking status: Never Smoker  .  Smokeless tobacco: Never Used  . Alcohol use No  . Drug use: No  . Sexual activity: Not on file   Other Topics Concern  . Not on file   Social History Narrative   HSG   Married '68-22 years; Divorced   1 Daughter - '85; no grandchildren   Work: PT for [employer]   Lives: Alone, but adjoins daughter    FAMILY HISTORY:   Family Status  Relation Status  . Mother Deceased at age 73   CA, breast  . Father Deceased at age 81   unknown cause of death; pneumonia  . Daughter Alive   [phone]  . Brother     ROS:  A complete 10 system review of systems was obtained and was unremarkable apart from what is mentioned above.  PHYSICAL EXAMINATION:    VITALS:   Vitals:   05/28/16 0807  BP: (!) 160/90  Pulse: (!) 108  Weight: 195 lb (88.5 kg)  Height: 5\' 4"  (1.626 m)    GEN:  Normal appears female in no acute distress.  Appears stated age. HEENT:  Normocephalic, atraumatic. The mucous membranes are moist. The superficial temporal arteries are without ropiness or tenderness.  Head is turned right and significantly side bent to the left, with the left ear approximating the left shoulder Cardiovascular: Tachycardic, but regular rhythm (been ill and just had prednisone and steroid injection and inhaler per pt). Lungs: Clear to auscultation bilaterally. Neck/Heme: There are no  carotid bruits noted bilaterally.  There is signficant Hypertrophy of the left sternocleidomastoid.  There is involvement of the left trapezius.  There is palpable hypertrophy of the left levator scapulae.  The L ear slightly approximates the L shoulder  NEUROLOGICAL: Orientation:  The patient is alert and oriented x 3.   Cranial nerves: There is good facial symmetry. The pupils are equal round and reactive to light bilaterally. Fundoscopic exam reveals clear disc margins bilaterally. Extraocular muscles are intact and visual fields are full to confrontational testing. Speech is fluent and clear but she has vocal irritation. Soft palate rises symmetrically and there is no tongue deviation. Hearing is intact to conversational tone. Tone: Tone is good throughout. Sensation: Sensation is intact to light touch throughout Coordination:  The patient has no difficulty with RAM's or FNF bilaterally. Motor: Strength is 5/5 in the bilateral upper and lower extremities.  She has difficulty abducting the right shoulder.  Shoulder shrug is equal and symmetric. There is no pronator drift.  There are no fasciculations noted. Gait and Station: The patient is able to ambulate without difficulty.    IMPRESSION/PLAN  1.  Cervical dystonia  -This is very long-standing.  She has been doing botox for a long time but first botox with me was on 03/29/16.  Needs increase in L SCM and needs L levator scapulae added.   Increase L trap.  -asks me about more PT but need to wait until next botox as will be more effectivce  2.  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    -I do think that pain is due to both cervical dystonia and cervical degenerative changes.  I think that anti-inflammatory could be of value.  Talked about mobic and celebrex but want to get PCP input and will send Dr. Ronnald Ramp message in this regard.   3.  R rotator  cuff tear  -seen at [facility] and did  PT and shoulder PT helped some.  4.  Much greater than 50% of this visit was spent in counseling and coordinating care.  Total face to face time:  30 min

## 2016-05-28 ENCOUNTER — Ambulatory Visit (INDEPENDENT_AMBULATORY_CARE_PROVIDER_SITE_OTHER): Payer: Medicare Other | Admitting: Neurology

## 2016-05-28 ENCOUNTER — Encounter: Payer: Self-pay | Admitting: Neurology

## 2016-05-28 VITALS — BP 160/90 | HR 108 | Ht 64.0 in | Wt 195.0 lb

## 2016-05-28 DIAGNOSIS — G243 Spasmodic torticollis: Secondary | ICD-10-CM | POA: Diagnosis not present

## 2016-05-28 DIAGNOSIS — M75121 Complete rotator cuff tear or rupture of right shoulder, not specified as traumatic: Secondary | ICD-10-CM | POA: Diagnosis not present

## 2016-06-21 ENCOUNTER — Ambulatory Visit: Payer: Medicare Other | Admitting: Neurology

## 2016-07-15 ENCOUNTER — Encounter: Payer: Self-pay | Admitting: Internal Medicine

## 2016-07-15 ENCOUNTER — Ambulatory Visit (INDEPENDENT_AMBULATORY_CARE_PROVIDER_SITE_OTHER): Payer: Medicare Other | Admitting: Internal Medicine

## 2016-07-15 ENCOUNTER — Other Ambulatory Visit (INDEPENDENT_AMBULATORY_CARE_PROVIDER_SITE_OTHER): Payer: Medicare Other

## 2016-07-15 VITALS — BP 160/88 | HR 105 | Temp 97.9°F | Resp 16 | Ht 64.0 in | Wt 193.8 lb

## 2016-07-15 DIAGNOSIS — M5030 Other cervical disc degeneration, unspecified cervical region: Secondary | ICD-10-CM

## 2016-07-15 DIAGNOSIS — I10 Essential (primary) hypertension: Secondary | ICD-10-CM

## 2016-07-15 DIAGNOSIS — R310 Gross hematuria: Secondary | ICD-10-CM

## 2016-07-15 DIAGNOSIS — J0101 Acute recurrent maxillary sinusitis: Secondary | ICD-10-CM

## 2016-07-15 DIAGNOSIS — D508 Other iron deficiency anemias: Secondary | ICD-10-CM

## 2016-07-15 LAB — CBC WITH DIFFERENTIAL/PLATELET
BASOS PCT: 0.1 % (ref 0.0–3.0)
Basophils Absolute: 0 10*3/uL (ref 0.0–0.1)
EOS PCT: 3.8 % (ref 0.0–5.0)
Eosinophils Absolute: 0.3 10*3/uL (ref 0.0–0.7)
HCT: 43.4 % (ref 36.0–46.0)
HEMOGLOBIN: 14.9 g/dL (ref 12.0–15.0)
LYMPHS PCT: 12.2 % (ref 12.0–46.0)
Lymphs Abs: 0.9 10*3/uL (ref 0.7–4.0)
MCHC: 34.3 g/dL (ref 30.0–36.0)
MCV: 89.1 fl (ref 78.0–100.0)
Monocytes Absolute: 0.4 10*3/uL (ref 0.1–1.0)
Monocytes Relative: 5 % (ref 3.0–12.0)
Neutro Abs: 6.2 10*3/uL (ref 1.4–7.7)
Neutrophils Relative %: 78.9 % — ABNORMAL HIGH (ref 43.0–77.0)
Platelets: 243 10*3/uL (ref 150.0–400.0)
RBC: 4.87 Mil/uL (ref 3.87–5.11)
RDW: 14.8 % (ref 11.5–15.5)
WBC: 7.8 10*3/uL (ref 4.0–10.5)

## 2016-07-15 LAB — URINALYSIS, ROUTINE W REFLEX MICROSCOPIC
Bilirubin Urine: NEGATIVE
KETONES UR: NEGATIVE
Leukocytes, UA: NEGATIVE
NITRITE: NEGATIVE
Total Protein, Urine: NEGATIVE
URINE GLUCOSE: NEGATIVE
UROBILINOGEN UA: 0.2 (ref 0.0–1.0)
pH: 6 (ref 5.0–8.0)

## 2016-07-15 LAB — COMPREHENSIVE METABOLIC PANEL
ALBUMIN: 4.5 g/dL (ref 3.5–5.2)
ALT: 30 U/L (ref 0–35)
AST: 25 U/L (ref 0–37)
Alkaline Phosphatase: 58 U/L (ref 39–117)
BUN: 14 mg/dL (ref 6–23)
CHLORIDE: 100 meq/L (ref 96–112)
CO2: 35 mEq/L — ABNORMAL HIGH (ref 19–32)
Calcium: 10.1 mg/dL (ref 8.4–10.5)
Creatinine, Ser: 0.72 mg/dL (ref 0.40–1.20)
GFR: 84.83 mL/min (ref >60.00)
Glucose, Bld: 96 mg/dL (ref 70–99)
POTASSIUM: 3.6 meq/L (ref 3.5–5.1)
Sodium: 142 mEq/L (ref 135–145)
Total Bilirubin: 2.6 mg/dL — ABNORMAL HIGH (ref 0.2–1.2)
Total Protein: 7.5 g/dL (ref 6.0–8.3)

## 2016-07-15 MED ORDER — CLINDAMYCIN HCL 300 MG PO CAPS: 300.0000 mg | ORAL_CAPSULE | Freq: Three times a day (TID) | ORAL | 1 refills | Status: DC

## 2016-07-15 MED ORDER — MELOXICAM 15 MG PO TABS: 15.0000 mg | ORAL_TABLET | Freq: Every day | ORAL | 1 refills | Status: DC

## 2016-07-15 NOTE — Patient Instructions (Signed)

## 2016-07-15 NOTE — Progress Notes (Signed)
Pre visit review using our clinic review tool, if applicable. No additional management support is needed unless otherwise documented below in the visit note. 

## 2016-07-15 NOTE — Progress Notes (Signed)
Subjective:  Patient ID: Katie Stout, female    DOB: 1946-03-20  Age: 71 y.o. MRN: [id number]  CC: Hypertension and Sinusitis   HPI Katie Stout presents for a blood pressure check and concern about a persistent sinus infection.  She tells me she has had sinus symptoms for several months. She has been seen twice before and is tried a couple rounds of steroids in different antibiotics. She complains of persistent the nasal phlegm that she describes as thick, dark, yellow. She has had chills, facial pain and laryngitis. She has had a normal chest x-ray during this. She has a mild nonproductive cough. She denies night sweats, fever, chest pain, hemoptysis, wheezing, or shortness of breath. She has taken a few doses of decongestants in the last week and is concerned that that may have elevated her blood pressure.  She also complains of chronic neck pain and wants to try a prescription anti-inflammatory.  Outpatient Medications Prior to Visit  Medication Sig Dispense Refill  . ALPRAZolam (XANAX) 0.25 MG tablet Take 1 tablet (0.25 mg total) by mouth at bedtime as needed for sleep. 30 tablet 2  . calcium carbonate (TUMS CALCIUM FOR LIFE BONE) 750 MG chewable tablet Chew 2-4 tablets by mouth daily.      . cholecalciferol (VITAMIN D) 1000 UNITS tablet Take 1,000 Units by mouth daily.      . fexofenadine (ALLEGRA) 30 MG tablet Take 30 mg by mouth 2 (two) times daily.    . hydrochlorothiazide (HYDRODIURIL) 25 MG tablet TAKE 1 TABLET EVERY DAY 90 tablet 1  . hyoscyamine (LEVSIN SL) 0.125 MG SL tablet Place 1 tablet (0.125 mg total) under the tongue every 4 (four) hours as needed for cramping. 30 tablet 4  . rosuvastatin (CRESTOR) 10 MG tablet TAKE 1 TABLET BY MOUTH DAILY. 90 tablet 3  . Multiple Vitamins-Minerals (CENTRUM SILVER 50+WOMEN) TABS Take 1 tablet by mouth daily.    . naproxen sodium (ANAPROX) 220 MG tablet Take 220 mg by mouth 2 (two) times daily with a meal.    .  guaiFENesin-dextromethorphan (ROBITUSSIN DM) 100-10 MG/5ML syrup Take 10 mLs by mouth every 6 (six) hours as needed for cough. (Patient not taking: Reported on 07/15/2016) 118 mL 0  . albuterol (PROVENTIL HFA;VENTOLIN HFA) 108 (90 Base) MCG/ACT inhaler Inhale 2 puffs into the lungs every 6 (six) hours as needed for wheezing or shortness of breath. (Patient not taking: Reported on 07/15/2016) 1 Inhaler 1  . benzonatate (TESSALON) 100 MG capsule Take 1 capsule (100 mg total) by mouth 3 (three) times daily as needed for cough. 20 capsule 0   Facility-Administered Medications Prior to Visit  Medication Dose Route Frequency Provider Last Rate Last Dose  . botulinum toxin Type A (BOTOX) injection 200 Units  200 Units Intramuscular Once Rebecca S Tat, DO        ROS Review of Systems  Constitutional: Positive for chills. Negative for diaphoresis, fatigue and fever.  HENT: Positive for congestion, postnasal drip, rhinorrhea, sinus pain and sinus pressure. Negative for facial swelling, sneezing, sore throat, trouble swallowing and voice change.   Eyes: Negative.  Negative for visual disturbance.  Respiratory: Positive for cough. Negative for choking, chest tightness, shortness of breath and wheezing.   Cardiovascular: Negative for chest pain, palpitations and leg swelling.  Gastrointestinal: Negative for abdominal pain, constipation, diarrhea, nausea and vomiting.  Endocrine: Negative.   Genitourinary: Negative.   Musculoskeletal: Negative.  Negative for back pain and myalgias.  Skin: Negative.  Allergic/Immunologic: Negative.   Neurological: Negative for dizziness, weakness and headaches.  Hematological: Negative.  Negative for adenopathy. Does not bruise/bleed easily.  Psychiatric/Behavioral: Negative.     Objective:  BP (!) 160/88 (BP Location: Left Arm, Patient Position: Sitting, Cuff Size: Normal)   Pulse (!) 105   Temp 97.9 F (36.6 C) (Oral)   Resp 16   Ht 5\' 4"  (1.626 m)   Wt 193 lb 12  oz (87.9 kg)   SpO2 95%   BMI 33.26 kg/m   BP Readings from Last 3 Encounters:  07/15/16 (!) 160/88  05/28/16 (!) 160/90  05/23/16 (!) 146/86    Wt Readings from Last 3 Encounters:  07/15/16 193 lb 12 oz (87.9 kg)  05/28/16 195 lb (88.5 kg)  05/23/16 194 lb (88 kg)    Physical Exam  Constitutional: She is oriented to person, place, and time. No distress.  HENT:  Right Ear: Hearing, tympanic membrane, external ear and ear canal normal.  Left Ear: Hearing, tympanic membrane, external ear and ear canal normal.  Nose: No mucosal edema or sinus tenderness. No epistaxis. Right sinus exhibits maxillary sinus tenderness. Right sinus exhibits no frontal sinus tenderness. Left sinus exhibits maxillary sinus tenderness. Left sinus exhibits no frontal sinus tenderness.  Mouth/Throat: Oropharynx is clear and moist and mucous membranes are normal. Mucous membranes are not pale, not dry and not cyanotic. No oral lesions. No trismus in the jaw. No uvula swelling. No oropharyngeal exudate, posterior oropharyngeal edema, posterior oropharyngeal erythema or tonsillar abscesses.  Eyes: Conjunctivae are normal. Right eye exhibits no discharge. Left eye exhibits no discharge. No scleral icterus.  Neck: Normal range of motion. No JVD present. No tracheal deviation present. No thyromegaly present.  Cardiovascular: Normal rate, regular rhythm, normal heart sounds and intact distal pulses.  Exam reveals no gallop and no friction rub.   No murmur heard. Pulmonary/Chest: Effort normal and breath sounds normal. No stridor. No respiratory distress. She has no wheezes. She has no rales. She exhibits no tenderness.  Abdominal: Soft. Bowel sounds are normal. She exhibits no distension and no mass. There is no tenderness. There is no rebound and no guarding.  Musculoskeletal: Normal range of motion. She exhibits no edema, tenderness or deformity.  Lymphadenopathy:    She has no cervical adenopathy.  Neurological: She  is oriented to person, place, and time.  Skin: Skin is warm and dry. No rash noted. She is not diaphoretic. No erythema. No pallor.  Vitals reviewed.   Lab Results  Component Value Date   WBC 7.8 07/15/2016   HGB 14.9 07/15/2016   HCT 43.4 07/15/2016   PLT 243.0 07/15/2016   GLUCOSE 96 07/15/2016   CHOL 170 01/10/2016   TRIG 93.0 01/10/2016   HDL 67.30 01/10/2016   LDLDIRECT 132.2 06/10/2012   LDLCALC 84 01/10/2016   ALT 30 07/15/2016   AST 25 07/15/2016   NA 142 07/15/2016   K 3.6 07/15/2016   CL 100 07/15/2016   CREATININE 0.72 07/15/2016   BUN 14 07/15/2016   CO2 35 (H) 07/15/2016   TSH 3.68 01/10/2016   HGBA1C 5.9 12/26/2006    Dg Chest 2 View  Result Date: 05/23/2016 CLINICAL DATA:  Cough, shortness of breath, fever, and fatigue for 2 weeks. EXAM: CHEST  2 VIEW COMPARISON:  03/05/2015 FINDINGS: The heart size and mediastinal contours are within normal limits. Aortic atherosclerosis. Both lungs are clear. The visualized skeletal structures are unremarkable. Mild bibasilar scarring is stable. No evidence of pulmonary infiltrate or  edema. No evidence of pleural effusion. Mild upper and mid thoracic spine degenerative changes noted. IMPRESSION: Stable mild bibasilar scarring.  No active cardiopulmonary disease. Aortic atherosclerosis. Electronically Signed   By: Earle Gell M.D.   On: 05/23/2016 11:57    Assessment & Plan:   Raveena was seen today for hypertension and sinusitis.  Diagnoses and all orders for this visit:  Hematuria, gross- This is resolved -     Urinalysis, Routine w reflex microscopic; Future  Essential hypertension- her blood pressure is not well controlled but she has been taking decongestants recently. Her electrolytes and renal function are normal. I've asked her to stop the decongestants and return in a few weeks for a recheck of her blood pressure. -     Comprehensive metabolic panel; Future  Other iron deficiency anemia- improvement noted -      CBC with Differential/Platelet; Future  Acute recurrent maxillary sinusitis- I'm concerned she may have a resistant organism so I've asked her take clindamycin to cover anaerobes and gram positives -     clindamycin (CLEOCIN) 300 MG capsule; Take 1 capsule (300 mg total) by mouth 3 (three) times daily.  Degenerative disc disease, cervical -     meloxicam (MOBIC) 15 MG tablet; Take 1 tablet (15 mg total) by mouth daily.   I have discontinued Ms. Cosgriff's CENTRUM SILVER 50+WOMEN, naproxen sodium, albuterol, and benzonatate. I am also having her start on clindamycin and meloxicam. Additionally, I am having her maintain her calcium carbonate, cholecalciferol, rosuvastatin, hydrochlorothiazide, fexofenadine, ALPRAZolam, hyoscyamine, and guaiFENesin-dextromethorphan. We will continue to administer botulinum toxin Type A.  Meds ordered this encounter  Medications  . clindamycin (CLEOCIN) 300 MG capsule    Sig: Take 1 capsule (300 mg total) by mouth 3 (three) times daily.    Dispense:  21 capsule    Refill:  1  . meloxicam (MOBIC) 15 MG tablet    Sig: Take 1 tablet (15 mg total) by mouth daily.    Dispense:  90 tablet    Refill:  1     Follow-up: Return in about 2 weeks (around 07/29/2016).  [name], MD

## 2016-07-23 DIAGNOSIS — Z1231 Encounter for screening mammogram for malignant neoplasm of breast: Secondary | ICD-10-CM | POA: Diagnosis not present

## 2016-07-23 DIAGNOSIS — M8589 Other specified disorders of bone density and structure, multiple sites: Secondary | ICD-10-CM | POA: Diagnosis not present

## 2016-07-23 DIAGNOSIS — Z803 Family history of malignant neoplasm of breast: Secondary | ICD-10-CM | POA: Diagnosis not present

## 2016-07-23 LAB — HM MAMMOGRAPHY

## 2016-07-23 LAB — HM DEXA SCAN

## 2016-07-27 ENCOUNTER — Other Ambulatory Visit: Payer: Self-pay | Admitting: Internal Medicine

## 2016-07-27 ENCOUNTER — Encounter: Payer: Self-pay | Admitting: Internal Medicine

## 2016-07-27 NOTE — Progress Notes (Signed)
Mammogram no evidence of malignancy.

## 2016-08-07 ENCOUNTER — Telehealth: Payer: Self-pay | Admitting: Neurology

## 2016-08-07 NOTE — Telephone Encounter (Signed)
Patient needs to talk to someone she has some questions but also she needs to know when she can come in for Botox she has had to cancel her appt for 08-09-16 due to helping her daughter with the kids while husband is in boot camp please call [phone]

## 2016-08-07 NOTE — Telephone Encounter (Signed)
Patient scheduled for later on Friday 08/09/16.

## 2016-08-09 ENCOUNTER — Ambulatory Visit: Payer: Medicare Other | Admitting: Neurology

## 2016-08-09 ENCOUNTER — Ambulatory Visit (INDEPENDENT_AMBULATORY_CARE_PROVIDER_SITE_OTHER): Payer: Medicare Other | Admitting: Neurology

## 2016-08-09 DIAGNOSIS — G243 Spasmodic torticollis: Secondary | ICD-10-CM | POA: Diagnosis not present

## 2016-08-09 MED ORDER — ONABOTULINUMTOXINA 100 UNITS IJ SOLR
240.0000 [IU] | Freq: Once | INTRAMUSCULAR | Status: AC
  Administered 2016-08-09: 240 [IU] via INTRAMUSCULAR

## 2016-08-09 NOTE — Procedures (Signed)
[facility]   Procedure Note Botox  Attending: Dr. Wells Guiles Tat  Preoperative Diagnosis(es): Cervical Dystonia  Result History  Onset of effect: unknown Duration of Benefit: less side bending of head but still significant neck pain and pulling of trap muscles.  Been 4.5 months since last injections as had to cx last one due to illness Adverse Effects: n/a   Consent obtained from: The patient Benefits discussed included, but were not limited to decreased muscle tightness, increased joint range of motion, and decreased pain.  Risk discussed included, but were not limited pain and discomfort, bleeding, bruising, excessive weakness, venous thrombosis, muscle atrophy and dysphagia.  A copy of the patient medication guide was given to the patient which explains the blackbox warning.  Patients identity and treatment sites confirmed Yes.  .  Details of Procedure: Skin was cleaned with alcohol.  A 30 gauge, 1/2 inch needle was introduced to the target muscle (except splenius capitus, posterior approach, where 27 inch, 1 1/2 gauge needle was used).  Prior to injection, the needle plunger was aspirated to make sure the needle was not within a blood vessel.  There was no blood retrieved on aspiration.    Following is a summary of the muscles injected  And the amount of Botulinum toxin used:   Dilution 0.9% preservative free saline mixed with 100 u Botox type A to make 10 U per 0.1cc  Injections  Location Left  Right Units Number of sites        Sternocleidomastoid 50/20  70 2  Splenius Capitus, posterior approach  60 60 1  Splenius Capitus, lateral approach  40 40 1  Levator Scapulae 20  20 1   Trapezius 20/20/10  50 3        TOTAL UNITS:   240    Agent: Botulinum Type A ( Onobotulinum Toxin type A ).  2 vials of Botox were used, each containing 100 units and freshly diluted with 1 mL of sterile, non-preserved saline   Total injected (Units): 240  Total wasted (Units): 0   Pt  tolerated procedure well without complications.   Reinjection is anticipated in 3 months.

## 2016-08-09 NOTE — Addendum Note (Signed)
Addended byAnnamaria Helling on: 08/09/2016 03:43 PM   Modules accepted: Orders

## 2016-08-12 ENCOUNTER — Encounter: Payer: Self-pay | Admitting: Internal Medicine

## 2016-08-15 ENCOUNTER — Encounter: Payer: Self-pay | Admitting: Internal Medicine

## 2016-08-15 NOTE — Progress Notes (Signed)
Result abstracted 

## 2016-08-16 ENCOUNTER — Other Ambulatory Visit: Payer: Self-pay | Admitting: Internal Medicine

## 2016-08-22 ENCOUNTER — Other Ambulatory Visit: Payer: Self-pay | Admitting: Internal Medicine

## 2016-08-22 ENCOUNTER — Telehealth: Payer: Self-pay | Admitting: *Deleted

## 2016-08-22 DIAGNOSIS — J0101 Acute recurrent maxillary sinusitis: Secondary | ICD-10-CM

## 2016-08-22 MED ORDER — DOXYCYCLINE MONOHYDRATE 100 MG PO CAPS: 100.0000 mg | ORAL_CAPSULE | Freq: Two times a day (BID) | ORAL | 0 refills | Status: DC

## 2016-08-22 NOTE — Telephone Encounter (Signed)
Rec'd call pt states MD rx her Clindamycin and gave her a refill on it , and told her to take if the 1st round didn't work. Pt states she completed first round, 2 days later symptoms return w/sinus pressure, sore throat, ears hurting. She is wanting to know can she have doxycycline instead. It works better for her...Johny Chess

## 2016-08-22 NOTE — Telephone Encounter (Signed)
done

## 2016-08-22 NOTE — Telephone Encounter (Signed)
Notified pt MD ok rx has been sent to [facility].../lmb

## 2016-09-05 DIAGNOSIS — R490 Dysphonia: Secondary | ICD-10-CM | POA: Diagnosis not present

## 2016-09-17 NOTE — Progress Notes (Signed)
Katie Stout was seen today in neurologic consultation at the request of [name], MD.  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  The records that were made available to me were reviewed.  She has been seen by Dr. Gilford [name] at [facility] for years and has been getting botox for 17 years.  It appears that she was getting dysport but has been getting onobotulinum toxin type A as of late.  Her last injections were on 10/05/15.  States that the last injections were done differently because she was having some pain and after the injections she had pain down the right shoulder and neck.  She is having a lot of neck pain that starts in the right occiput and may radiate to the right shoulder but almost always goes to the right head and causes a headache.  It causes a shooting pain in the head but a "dull shooting pain."  It will hurt in the occiput to touch.  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  Also c/o significant R shoulder pain and cannot put her right arm behind her back without pain (do bra, etc).  Neuroimaging has previously been performed. 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  MRI brain done same time which was reported to show some T2 hyperintensities.  05/28/16 update:  Patient returns for follow-up regarding her cervical dystonia.  She has long been receiving Botox, but had her first injections with me on 03/29/2016.  She had 180 units.  Patient denies any swallowing difficulties.  She denies any head heaviness.  She reports that her head is much straighter but she is having pain down the right neck.   Aleve helps but she doesn't like to take it.   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  I thought some of her pain was related to  occipital neuralgia and told her that I was willing to do an occipital nerve block, but she decided to hold on that.  She also had an MRI of the shoulder since our last visit, which demonstrated Focal full-thickness tear of the superior aspect of the subscapularis tendon with subluxation of the severely degenerated long head of the biceps tendon into the tear.  She also had a musculotendinous tear of the infraspinatus.  Was referred to Raliegh Ip and she reports that she was sent to PT and she feels that she got stronger through PT.  PT also worked on her shoulder but she only had short term benefit with it so the PT released her from that.  09/18/16 update:  Patient returns today for follow-up.  Her last Botox injections were on 08/09/2016.  240 units were injected.  She had swallowing difficulties last time.  She noted swallowing trouble with meats, like chicken.  This has gotten better.  She was very careful. She denies any feeling of heaviness to the head or neck.  In regards to her neck pain, she states that she takes aleve 3-4 days a week.  It does help.  The pain was better with botox.  No head tremor  PREVIOUS MEDICATIONS:   ALLERGIES:   Allergies  Allergen Reactions  . Penicillins Hives  . Penicillin G  Other reaction(s): Other (See Comments) hives  . Adhesive [Tape] Other (See Comments)    SKIN REDNESS AND IRRITATION  . Bactrim [Sulfamethoxazole-Trimethoprim] Rash  . Codeine Nausea Only    CURRENT MEDICATIONS:  Outpatient Encounter Prescriptions as of 09/18/2016  Medication Sig  . ALPRAZolam (XANAX) 0.25 MG tablet Take 1 tablet (0.25 mg total) by mouth at bedtime as needed for sleep.  . calcium carbonate (TUMS CALCIUM FOR LIFE BONE) 750 MG chewable tablet Chew 2-4 tablets by mouth daily.    . cholecalciferol (VITAMIN D) 1000 UNITS tablet Take 1,000 Units by mouth daily.    . fexofenadine (ALLEGRA) 30 MG tablet Take 30 mg by mouth 2 (two) times daily.  . hydrochlorothiazide  (HYDRODIURIL) 25 MG tablet TAKE 1 TABLET EVERY DAY  . hyoscyamine (LEVSIN SL) 0.125 MG SL tablet Place 1 tablet (0.125 mg total) under the tongue every 4 (four) hours as needed for cramping.  . rosuvastatin (CRESTOR) 10 MG tablet TAKE 1 TABLET BY MOUTH DAILY.  . [DISCONTINUED] doxycycline (MONODOX) 100 MG capsule Take 1 capsule (100 mg total) by mouth 2 (two) times daily.  . [DISCONTINUED] meloxicam (MOBIC) 15 MG tablet Take 1 tablet (15 mg total) by mouth daily.   Facility-Administered Encounter Medications as of 09/18/2016  Medication  . botulinum toxin Type A (BOTOX) injection 200 Units    PAST MEDICAL HISTORY:   Past Medical History:  Diagnosis Date  . Anxiety   . Complication of anesthesia    states had flashing lights x 6 wks. post-op in 1992  . Dental crowns present   . Difficulty swallowing solids    states difficulty swallowing dry foods  . Diverticulosis of colon (without mention of hemorrhage)   . Eczema   . GERD (gastroesophageal reflux disease)   . H. pylori infection   . Hiatal hernia    states "small"  . History of kidney stones   . Hypertension    under control, has been on med. x 7 yrs.  . 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 Other and unspecified hyperlipidemia   . Palpitations    "all my life" - states no problems; states had stress test 2006; no cardiologist  . Post-nasal drip 04/10/2012  . Rash    breasts  . Spasmodic torticollis    has difficulty turning head to left side; receives Botox injections    PAST SURGICAL HISTORY:   Past Surgical History:  Procedure Laterality Date  . ABDOMINAL HYSTERECTOMY  1993   complete  . CATARACT EXTRACTION, BILATERAL  2007  . CESAREAN SECTION    . COLONOSCOPY  02/10/2013  . ESOPHAGOGASTRODUODENOSCOPY  02/10/2013  . ESOPHAGOSCOPY  04/14/2012   Procedure: ESOPHAGOSCOPY;  Surgeon: Rozetta Nunnery, MD;  Location: [place];  Service: ENT;  Laterality: Right;  . LARYNX SURGERY  11/2011    vocal cord biopsy  . NASAL HEMORRHAGE CONTROL  04/14/2012   Procedure: EPISTAXIS CONTROL;  Surgeon: Rozetta Nunnery, MD;  Location: [place];  Service: ENT;  Laterality: Right;  Cauterization of Right Septal Vessel  . OOPHORECTOMY  1992   x 1  . ORIF ULNAR FRACTURE  01/16/2000   left: also radial head dislocation  . 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 Marital status: Widowed    Spouse name: N/A  . Number of children: 1  . Years of education: N/A  Occupational History  . retired    Social History Main Topics  . Smoking status: Never Smoker  . Smokeless tobacco: Never Used  . Alcohol use No  . Drug use: No  . Sexual activity: Not on file   Other Topics Concern  . Not on file   Social History Narrative   HSG   Married '68-22 years; Divorced   1 Daughter - '85; no grandchildren   Work: PT for [employer]   Lives: Alone, but adjoins daughter    FAMILY HISTORY:   Family Status  Relation Status  . Mother Deceased at age 84   CA, breast  . Father Deceased at age 12   unknown cause of death; pneumonia  . Daughter Alive   [phone]  . Brother     ROS:  A complete 10 system review of systems was obtained and was unremarkable apart from what is mentioned above.  PHYSICAL EXAMINATION:    VITALS:   Vitals:   09/18/16 1106  BP: (!) 156/92  Pulse: 100  SpO2: 96%  Weight: 194 lb (88 kg)  Height: 5' 4.5" (1.638 m)    GEN:  Normal appears female in no acute distress.  Appears stated age. HEENT:  Normocephalic, atraumatic. The mucous membranes are moist. The superficial temporal arteries are without ropiness or tenderness.  Head is more in midline and almost no tilt of head today Cardiovascular: Tachycardic, but regular rhythm  Lungs: Clear to auscultation bilaterally. Neck/Heme: There are no carotid bruits noted bilaterally.  There is still  signficant Hypertrophy of the left sternocleidomastoid.  There is palpable hypertrophy of the left levator scapulae.    NEUROLOGICAL: Orientation:  The patient is alert and oriented x 3.   Cranial nerves: There is good facial symmetry. The pupils are equal round and reactive to light bilaterally. Fundoscopic exam reveals clear disc margins bilaterally. Extraocular muscles are intact and visual fields are full to confrontational testing. Speech is fluent and clear but she has vocal irritation. Soft palate rises symmetrically and there is no tongue deviation. Hearing is intact to conversational tone. Tone: Tone is good throughout. Sensation: Sensation is intact to light touch throughout Coordination:  The patient has no difficulty with RAM's or FNF bilaterally. Motor: Strength is 5/5 in the bilateral upper and lower extremities.  She has difficulty abducting the right shoulder.  Shoulder shrug is equal and symmetric. There is no pronator drift.  There are no fasciculations noted. Gait and Station: The patient is able to ambulate without difficulty.    IMPRESSION/PLAN  1.  Cervical dystonia  -This is very long-standing.  She has been doing botox for a long time but first botox with me was on 03/29/16 and most recent was on 08/09/2016.  She had swallowing trouble with this one, likely due to adding levator scapulae.  I talked to her about taking this out.  She asked me to try to just reduce the dose to 10 units.  She fully understands risks  2.  Neck pain with occipital neuralgia and cervical degenerative changes and facet arthritis  -I do think that pain is due to both cervical dystonia and cervical degenerative changes.  I think that anti-inflammatory could be of value.    3.  HTN  -told her to talk to PCP.  I did recheck BP twice and showed her the trending BP's  4.  Much greater than 50% of this visit was spent in counseling and coordinating care.  Total face to face time:  30 min

## 2016-09-18 ENCOUNTER — Encounter: Payer: Self-pay | Admitting: Neurology

## 2016-09-18 ENCOUNTER — Ambulatory Visit (INDEPENDENT_AMBULATORY_CARE_PROVIDER_SITE_OTHER): Payer: Medicare Other | Admitting: Neurology

## 2016-09-18 VITALS — BP 160/88 | HR 100 | Ht 64.5 in | Wt 194.0 lb

## 2016-09-18 DIAGNOSIS — G243 Spasmodic torticollis: Secondary | ICD-10-CM | POA: Diagnosis not present

## 2016-09-18 DIAGNOSIS — R1319 Other dysphagia: Secondary | ICD-10-CM | POA: Diagnosis not present

## 2016-09-18 DIAGNOSIS — I10 Essential (primary) hypertension: Secondary | ICD-10-CM

## 2016-10-03 ENCOUNTER — Telehealth: Payer: Self-pay | Admitting: Internal Medicine

## 2016-10-03 NOTE — Telephone Encounter (Signed)
Called patient to schedule awv. Patient did not answer. Will try to call patient again at a later date.

## 2016-10-29 ENCOUNTER — Ambulatory Visit: Payer: Medicare Other | Admitting: Neurology

## 2016-11-08 ENCOUNTER — Ambulatory Visit (INDEPENDENT_AMBULATORY_CARE_PROVIDER_SITE_OTHER): Payer: Medicare Other | Admitting: Neurology

## 2016-11-08 DIAGNOSIS — G243 Spasmodic torticollis: Secondary | ICD-10-CM | POA: Diagnosis not present

## 2016-11-08 MED ORDER — ONABOTULINUMTOXINA 100 UNITS IJ SOLR
230.0000 [IU] | Freq: Once | INTRAMUSCULAR | Status: AC
  Administered 2016-11-08: 230 [IU] via INTRAMUSCULAR

## 2016-11-08 NOTE — Procedures (Signed)
[facility]   Procedure Note Botox  Attending: Dr. Wells Guiles Bertice Risse  Preoperative Diagnosis(es): Cervical Dystonia  Result History  Onset of effect: unknown Duration of Benefit: started wearing off last week with increased neck pain Adverse Effects: dysphagia  Consent obtained from: The patient Benefits discussed included, but were not limited to decreased muscle tightness, increased joint range of motion, and decreased pain.  Risk discussed included, but were not limited pain and discomfort, bleeding, bruising, excessive weakness, venous thrombosis, muscle atrophy and dysphagia.  A copy of the patient medication guide was given to the patient which explains the blackbox warning.  Patients identity and treatment sites confirmed Yes.  .  Details of Procedure: Skin was cleaned with alcohol.  A 30 gauge, 1/2 inch needle was introduced to the target muscle (except splenius capitus, posterior approach, where 27 inch, 1 1/2 gauge needle was used).  Prior to injection, the needle plunger was aspirated to make sure the needle was not within a blood vessel.  There was no blood retrieved on aspiration.    Following is a summary of the muscles injected  And the amount of Botulinum toxin used:   Dilution 0.9% preservative free saline mixed with 100 u Botox type A to make 10 U per 0.1cc  Injections  Location Left  Right Units Number of sites        Sternocleidomastoid 50/20  70 2  Splenius Capitus, posterior approach  60 60 1  Splenius Capitus, lateral approach  40 40 1  Levator Scapulae 10  10 1   Trapezius 20/20/10  50 3        TOTAL UNITS:   230    Agent: Botulinum Type A ( Onobotulinum Toxin type A ).  2 vials of Botox were used, each containing 100 units and freshly diluted with 1 mL of sterile, non-preserved saline   Total injected (Units): 230  Total wasted (Units): 20   Pt tolerated procedure well without complications.   Reinjection is anticipated in 3 months.

## 2016-11-11 DIAGNOSIS — M7582 Other shoulder lesions, left shoulder: Secondary | ICD-10-CM | POA: Diagnosis not present

## 2016-12-03 ENCOUNTER — Other Ambulatory Visit: Payer: Self-pay | Admitting: Internal Medicine

## 2016-12-05 NOTE — Progress Notes (Signed)
Katie Stout was seen today in neurologic consultation at the request of [name], MD.  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  The records that were made available to me were reviewed.  She has been seen by Dr. Gilford [name] at [facility] for years and has been getting botox for 17 years.  It appears that she was getting dysport but has been getting onobotulinum toxin type A as of late.  Her last injections were on 10/05/15.  States that the last injections were done differently because she was having some pain and after the injections she had pain down the right shoulder and neck.  She is having a lot of neck pain that starts in the right occiput and may radiate to the right shoulder but almost always goes to the right head and causes a headache.  It causes a shooting pain in the head but a "dull shooting pain."  It will hurt in the occiput to touch.  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  Also c/o significant R shoulder pain and cannot put her right arm behind her back without pain (do bra, etc).  Neuroimaging has previously been performed. 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  MRI brain done same time which was reported to show some T2 hyperintensities.  05/28/16 update:  Patient returns for follow-up regarding her cervical dystonia.  She has long been receiving Botox, but had her first injections with me on 03/29/2016.  She had 180 units.  Patient denies any swallowing difficulties.  She denies any head heaviness.  She reports that her head is much straighter but she is having pain down the right neck.   Aleve helps but she doesn't like to take it.   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  I thought some of her pain was related to  occipital neuralgia and told her that I was willing to do an occipital nerve block, but she decided to hold on that.  She also had an MRI of the shoulder since our last visit, which demonstrated Focal full-thickness tear of the superior aspect of the subscapularis tendon with subluxation of the severely degenerated long head of the biceps tendon into the tear.  She also had a musculotendinous tear of the infraspinatus.  Was referred to Raliegh Ip and she reports that she was sent to PT and she feels that she got stronger through PT.  PT also worked on her shoulder but she only had short term benefit with it so the PT released her from that.  09/18/16 update:  Patient returns today for follow-up.  Her last Botox injections were on 08/09/2016.  240 units were injected.  She had swallowing difficulties last time.  She noted swallowing trouble with meats, like chicken.  This has gotten better.  She was very careful. She denies any feeling of heaviness to the head or neck.  In regards to her neck pain, she states that she takes aleve 3-4 days a week.  It does help.  The pain was better with botox.  No head tremor  12/17/16 update:  Patient seen today in follow-up.  Last Botox injections were in 11/08/2016.  We just slightly reduced the injection  dose to see if we can avoid swallowing difficulties.  She states that this is the best that she has ever done.  She still has some pain but thinks that is from her neck.  She reports that she is urinating "oodles of blood."  That started this morning.  She has had nephrolithiasis in the past but it has been years.  PREVIOUS MEDICATIONS:   ALLERGIES:   Allergies  Allergen Reactions  . Penicillins Hives  . Penicillin G     Other reaction(s): Other (See Comments) hives  . Adhesive [Tape] Other (See Comments)    SKIN REDNESS AND IRRITATION  . Bactrim [Sulfamethoxazole-Trimethoprim] Rash  . Codeine Nausea Only    CURRENT MEDICATIONS:  Outpatient Encounter  Prescriptions as of 12/17/2016  Medication Sig  . ALPRAZolam (XANAX) 0.25 MG tablet Take 1 tablet (0.25 mg total) by mouth at bedtime as needed for sleep.  . calcium carbonate (TUMS CALCIUM FOR LIFE BONE) 750 MG chewable tablet Chew 2-4 tablets by mouth daily.    . cholecalciferol (VITAMIN D) 1000 UNITS tablet Take 1,000 Units by mouth daily.    . fexofenadine (ALLEGRA) 30 MG tablet Take 30 mg by mouth 2 (two) times daily.  . hydrochlorothiazide (HYDRODIURIL) 25 MG tablet TAKE 1 TABLET EVERY DAY  . hyoscyamine (LEVSIN SL) 0.125 MG SL tablet Place 1 tablet (0.125 mg total) under the tongue every 4 (four) hours as needed for cramping.  . rosuvastatin (CRESTOR) 10 MG tablet TAKE 1 TABLET BY MOUTH DAILY.   Facility-Administered Encounter Medications as of 12/17/2016  Medication  . botulinum toxin Type A (BOTOX) injection 200 Units    PAST MEDICAL HISTORY:   Past Medical History:  Diagnosis Date  . Anxiety   . Complication of anesthesia    states had flashing lights x 6 wks. post-op in 1992  . Dental crowns present   . Difficulty swallowing solids    states difficulty swallowing dry foods  . Diverticulosis of colon (without mention of hemorrhage)   . Eczema   . GERD (gastroesophageal reflux disease)   . H. pylori infection   . Hiatal hernia    states "small"  . History of kidney stones   . Hypertension    under control, has been on med. x 7 yrs.  . 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 Other and unspecified hyperlipidemia   . Palpitations    "all my life" - states no problems; states had stress test 2006; no cardiologist  . Post-nasal drip 04/10/2012  . Rash    breasts  . Spasmodic torticollis    has difficulty turning head to left side; receives Botox injections    PAST SURGICAL HISTORY:   Past Surgical History:  Procedure Laterality Date  . ABDOMINAL HYSTERECTOMY  1993   complete  . CATARACT EXTRACTION, BILATERAL  2007  . CESAREAN SECTION    . COLONOSCOPY   02/10/2013  . ESOPHAGOGASTRODUODENOSCOPY  02/10/2013  . ESOPHAGOSCOPY  04/14/2012   Procedure: ESOPHAGOSCOPY;  Surgeon: Rozetta Nunnery, MD;  Location: [facility];  Service: ENT;  Laterality: Right;  . LARYNX SURGERY  11/2011   vocal cord biopsy  . NASAL HEMORRHAGE CONTROL  04/14/2012   Procedure: EPISTAXIS CONTROL;  Surgeon: Rozetta Nunnery, MD;  Location: [place];  Service: ENT;  Laterality: Right;  Cauterization of Right Septal Vessel  . OOPHORECTOMY  1992   x 1  . ORIF ULNAR FRACTURE  01/16/2000   left: also radial head  dislocation  . 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 Marital status: Widowed    Spouse name: N/A  . Number of children: 1  . Years of education: N/A   Occupational History  . retired    Social History Main Topics  . Smoking status: Never Smoker  . Smokeless tobacco: Never Used  . Alcohol use No  . Drug use: No  . Sexual activity: Not on file   Other Topics Concern  . Not on file   Social History Narrative   HSG   Married '68-22 years; Divorced   1 Daughter - '85; no grandchildren   Work: PT for [employer]   Lives: Alone, but adjoins daughter    FAMILY HISTORY:   Family Status  Relation Status  . Mother Deceased at age 105       CA, breast  . Father Deceased at age 57       unknown cause of death; pneumonia  . Daughter Alive       [phone]  . Brother (Not Specified)    ROS:  A complete 10 system review of systems was obtained and was unremarkable apart from what is mentioned above.  PHYSICAL EXAMINATION:    VITALS:   Vitals:   12/17/16 1506  BP: (!) 152/94  Pulse: 100  SpO2: 96%  Weight: 197 lb (89.4 kg)    GEN:  Normal appears female in no acute distress.  Appears stated age. HEENT:  Normocephalic, atraumatic. The mucous membranes are moist. The superficial temporal arteries are without ropiness  or tenderness.  Head is midline and no tilt today Cardiovascular: Tachycardic, but regular rhythm  Lungs: Clear to auscultation bilaterally. Neck/Heme: There are no carotid bruits noted bilaterally.  There is still Hypertrophy of the left sternocleidomastoid.  There is palpable hypertrophy of the left levator scapulae.  There is trapezius tenderness and hypertrophy, L more than R.  NEUROLOGICAL: Orientation:  The patient is alert and oriented x 3.   Cranial nerves: There is good facial symmetry. The pupils are equal round and reactive to light bilaterally. Fundoscopic exam reveals clear disc margins bilaterally. Extraocular muscles are intact and visual fields are full to confrontational testing. Speech is fluent and clear but she has vocal irritation. Soft palate rises symmetrically and there is no tongue deviation. Hearing is intact to conversational tone. Tone: Tone is good throughout. Sensation: Sensation is intact to light touch throughout Coordination:  The patient has no difficulty with RAM's or FNF bilaterally. Motor: Strength is 5/5 in the bilateral upper and lower extremities.  She has difficulty abducting the right shoulder.  Shoulder shrug is equal and symmetric. There is no pronator drift.  There are no fasciculations noted. Gait and Station: The patient is able to ambulate without difficulty.    IMPRESSION/PLAN  1.  Cervical dystonia  -This is very long-standing.  She has been doing botox for a long time but first botox with me was on 03/29/16 and most recent was on 11/08/2016.  Her head looks much better and she didn't have dysphagia last visit.  2.  Neck pain with occipital neuralgia and cervical degenerative changes and facet arthritis  -I do think that pain is due to both cervical dystonia and cervical degenerative changes.  I think that anti-inflammatory could be of value.    3.  HTN  -told her to talk to PCP.  Has been  high last several times  4.  Hematuria  -tried to  make appt with PCP today but she didn't want me to as she had other things to do.  Did allow me to make appt for tomorrow.  4.  Much greater than 50% of this visit was spent in counseling and coordinating care.  Total face to face time:  20 min

## 2016-12-17 ENCOUNTER — Encounter: Payer: Self-pay | Admitting: Neurology

## 2016-12-17 ENCOUNTER — Ambulatory Visit (INDEPENDENT_AMBULATORY_CARE_PROVIDER_SITE_OTHER): Payer: Medicare Other | Admitting: Neurology

## 2016-12-17 VITALS — BP 152/94 | HR 100 | Ht 64.5 in | Wt 197.0 lb

## 2016-12-17 DIAGNOSIS — R319 Hematuria, unspecified: Secondary | ICD-10-CM

## 2016-12-17 DIAGNOSIS — G243 Spasmodic torticollis: Secondary | ICD-10-CM

## 2016-12-17 DIAGNOSIS — I10 Essential (primary) hypertension: Secondary | ICD-10-CM

## 2016-12-18 ENCOUNTER — Ambulatory Visit (INDEPENDENT_AMBULATORY_CARE_PROVIDER_SITE_OTHER)
Admission: RE | Admit: 2016-12-18 | Discharge: 2016-12-18 | Disposition: A | Discharge status: Home / Self Care | Payer: Medicare Other | Source: Ambulatory Visit | Attending: Internal Medicine | Admitting: Internal Medicine

## 2016-12-18 ENCOUNTER — Encounter: Payer: Self-pay | Admitting: Internal Medicine

## 2016-12-18 ENCOUNTER — Ambulatory Visit (INDEPENDENT_AMBULATORY_CARE_PROVIDER_SITE_OTHER): Payer: Medicare Other | Admitting: Internal Medicine

## 2016-12-18 ENCOUNTER — Other Ambulatory Visit (INDEPENDENT_AMBULATORY_CARE_PROVIDER_SITE_OTHER): Payer: Medicare Other

## 2016-12-18 ENCOUNTER — Telehealth: Payer: Self-pay | Admitting: Internal Medicine

## 2016-12-18 VITALS — BP 150/80 | HR 104 | Temp 99.2°F | Resp 16 | Ht 64.5 in | Wt 196.0 lb

## 2016-12-18 DIAGNOSIS — R319 Hematuria, unspecified: Secondary | ICD-10-CM | POA: Diagnosis not present

## 2016-12-18 DIAGNOSIS — R011 Cardiac murmur, unspecified: Secondary | ICD-10-CM | POA: Diagnosis not present

## 2016-12-18 DIAGNOSIS — R310 Gross hematuria: Secondary | ICD-10-CM

## 2016-12-18 DIAGNOSIS — Z87442 Personal history of urinary calculi: Secondary | ICD-10-CM

## 2016-12-18 DIAGNOSIS — R0609 Other forms of dyspnea: Secondary | ICD-10-CM | POA: Diagnosis not present

## 2016-12-18 DIAGNOSIS — E876 Hypokalemia: Secondary | ICD-10-CM | POA: Diagnosis not present

## 2016-12-18 DIAGNOSIS — R0600 Dyspnea, unspecified: Secondary | ICD-10-CM | POA: Insufficient documentation

## 2016-12-18 DIAGNOSIS — R000 Tachycardia, unspecified: Secondary | ICD-10-CM | POA: Diagnosis not present

## 2016-12-18 DIAGNOSIS — I10 Essential (primary) hypertension: Secondary | ICD-10-CM | POA: Diagnosis not present

## 2016-12-18 DIAGNOSIS — D508 Other iron deficiency anemias: Secondary | ICD-10-CM

## 2016-12-18 LAB — POC URINALSYSI DIPSTICK (AUTOMATED)
BILIRUBIN UA: NEGATIVE
Blood, UA: POSITIVE
Glucose, UA: NEGATIVE
Ketones, UA: NEGATIVE
Leukocytes, UA: NEGATIVE
NITRITE UA: NEGATIVE
PH UA: 6 (ref 5.0–8.0)
Protein, UA: NEGATIVE
Spec Grav, UA: 1.015 (ref 1.010–1.025)
Urobilinogen, UA: 0.2 E.U./dL

## 2016-12-18 LAB — CBC WITH DIFFERENTIAL/PLATELET
BASOS ABS: 0 10*3/uL (ref 0.0–0.1)
Basophils Relative: 0.5 % (ref 0.0–3.0)
EOS PCT: 4.2 % (ref 0.0–5.0)
Eosinophils Absolute: 0.2 10*3/uL (ref 0.0–0.7)
HEMATOCRIT: 43.4 % (ref 36.0–46.0)
Hemoglobin: 14.7 g/dL (ref 12.0–15.0)
LYMPHS PCT: 15.9 % (ref 12.0–46.0)
Lymphs Abs: 0.9 10*3/uL (ref 0.7–4.0)
MCHC: 33.8 g/dL (ref 30.0–36.0)
MCV: 88.5 fl (ref 78.0–100.0)
MONOS PCT: 7.4 % (ref 3.0–12.0)
Monocytes Absolute: 0.4 10*3/uL (ref 0.1–1.0)
NEUTROS ABS: 3.9 10*3/uL (ref 1.4–7.7)
Neutrophils Relative %: 72 % (ref 43.0–77.0)
Platelets: 226 10*3/uL (ref 150.0–400.0)
RBC: 4.91 Mil/uL (ref 3.87–5.11)
RDW: 14.8 % (ref 11.5–15.5)
WBC: 5.5 10*3/uL (ref 4.0–10.5)

## 2016-12-18 LAB — COMPREHENSIVE METABOLIC PANEL
ALK PHOS: 56 U/L (ref 39–117)
ALT: 32 U/L (ref 0–35)
AST: 28 U/L (ref 0–37)
Albumin: 4.4 g/dL (ref 3.5–5.2)
BILIRUBIN TOTAL: 3.1 mg/dL — AB (ref 0.2–1.2)
BUN: 10 mg/dL (ref 6–23)
CALCIUM: 10.2 mg/dL (ref 8.4–10.5)
CO2: 34 mEq/L — ABNORMAL HIGH (ref 19–32)
Chloride: 99 mEq/L (ref 96–112)
Creatinine, Ser: 0.63 mg/dL (ref 0.40–1.20)
GFR: 98.84 mL/min (ref >60.00)
Glucose, Bld: 100 mg/dL — ABNORMAL HIGH (ref 70–99)
Potassium: 3.3 mEq/L — ABNORMAL LOW (ref 3.5–5.1)
Sodium: 140 mEq/L (ref 135–145)
TOTAL PROTEIN: 7.2 g/dL (ref 6.0–8.3)

## 2016-12-18 LAB — BRAIN NATRIURETIC PEPTIDE: PRO B NATRI PEPTIDE: 22 pg/mL (ref 0.0–100.0)

## 2016-12-18 LAB — THYROID PANEL WITH TSH
FREE THYROXINE INDEX: 2.2 (ref 1.4–3.8)
T3 Uptake: 27 % (ref 22–35)
T4 TOTAL: 8.3 ug/dL (ref 4.5–12.0)
TSH: 3.06 mIU/L

## 2016-12-18 LAB — CARDIAC PANEL
CK TOTAL: 178 U/L — AB (ref 7–177)
CK-MB: 3.3 ng/mL (ref 0.3–4.0)
Relative Index: 1.9 calc (ref 0.0–2.5)

## 2016-12-18 LAB — TROPONIN I: TNIDX: 0 ug/L (ref 0.00–0.06)

## 2016-12-18 NOTE — Telephone Encounter (Signed)
Pt came back into the office after her visit this afternoon and wanted to know if she should be put on BP meds since her BP has been running high?

## 2016-12-18 NOTE — Progress Notes (Signed)
Subjective:  Patient ID: Katie Stout, female    DOB: 10/25/1945  Age: 71 y.o. MRN: [id number]  CC: Hypertension   HPI Katie Stout presents for concerns about a 2 day history of mild, gross hematuria and diffuse bladder discomfort. She also reports mild bilateral flank pain for about a week. She has a history of nephrolithiasis. She is also concerned that her blood pressure is not well controlled. She has chronic worsening SOB and DOE. She denies cough, hemoptysis, chest pain, edema, palpitations, or fatigue.  Outpatient Medications Prior to Visit  Medication Sig Dispense Refill  . ALPRAZolam (XANAX) 0.25 MG tablet Take 1 tablet (0.25 mg total) by mouth at bedtime as needed for sleep. 30 tablet 2  . calcium carbonate (TUMS CALCIUM FOR LIFE BONE) 750 MG chewable tablet Chew 2-4 tablets by mouth daily.      . cholecalciferol (VITAMIN D) 1000 UNITS tablet Take 1,000 Units by mouth daily.      . fexofenadine (ALLEGRA) 30 MG tablet Take 30 mg by mouth 2 (two) times daily.    . hydrochlorothiazide (HYDRODIURIL) 25 MG tablet TAKE 1 TABLET EVERY DAY 90 tablet 1  . hyoscyamine (LEVSIN SL) 0.125 MG SL tablet Place 1 tablet (0.125 mg total) under the tongue every 4 (four) hours as needed for cramping. 30 tablet 4  . rosuvastatin (CRESTOR) 10 MG tablet TAKE 1 TABLET BY MOUTH DAILY. 90 tablet 1   Facility-Administered Medications Prior to Visit  Medication Dose Route Frequency Provider Last Rate Last Dose  . botulinum toxin Type A (BOTOX) injection 200 Units  200 Units Intramuscular Once Tat, Rebecca S, DO        ROS Review of Systems  Constitutional: Negative for activity change, appetite change, diaphoresis, fatigue and unexpected weight change.  HENT: Positive for trouble swallowing and voice change.   Eyes: Negative for visual disturbance.  Respiratory: Positive for chest tightness and shortness of breath. Negative for cough and stridor.   Cardiovascular: Negative for chest pain,  palpitations and leg swelling.  Gastrointestinal: Negative for abdominal pain, constipation, diarrhea, nausea and vomiting.  Endocrine: Negative.   Genitourinary: Positive for flank pain, hematuria and pelvic pain. Negative for decreased urine volume, difficulty urinating, dysuria, frequency and urgency.  Musculoskeletal: Negative for back pain and myalgias.  Skin: Negative.   Neurological: Negative.  Negative for dizziness, seizures, weakness, light-headedness and headaches.  Hematological: Negative.  Negative for adenopathy. Does not bruise/bleed easily.  Psychiatric/Behavioral: Negative.     Objective:  BP (!) 150/80 (BP Location: Left Arm, Patient Position: Sitting, Cuff Size: Large)   Pulse (!) 104   Temp 99.2 F (37.3 C) (Oral)   Resp 16   Ht 5' 4.5" (1.638 m)   Wt 196 lb (88.9 kg)   SpO2 93%   BMI 33.12 kg/m   BP Readings from Last 3 Encounters:  12/18/16 (!) 150/80  12/17/16 (!) 152/94  09/18/16 (!) 160/88    Wt Readings from Last 3 Encounters:  12/18/16 196 lb (88.9 kg)  12/17/16 197 lb (89.4 kg)  09/18/16 194 lb (88 kg)    Physical Exam  Constitutional: She is oriented to person, place, and time. No distress.  HENT:  Mouth/Throat: Oropharynx is clear and moist. No oropharyngeal exudate.  Eyes: Conjunctivae are normal. Right eye exhibits no discharge. Left eye exhibits no discharge. No scleral icterus.  Neck: Normal range of motion. Neck supple. No JVD present. No thyromegaly present.  Cardiovascular: Normal rate and regular rhythm.  Exam reveals  no gallop and no friction rub.   Murmur (1/6 SEM RUSB) heard. EKG -- Sinus  Tachycardia  WITHIN NORMAL LIMITS - her heart rate was 96 on an EKG done 2 years ago so today's rate is not significantly changed and the EKG is otherwise normal  Pulmonary/Chest: Effort normal and breath sounds normal. No respiratory distress. She has no wheezes. She has no rales. She exhibits no tenderness.  Abdominal: Soft. Bowel sounds are  normal. She exhibits no distension and no mass. There is no hepatosplenomegaly, splenomegaly or hepatomegaly. There is no tenderness. There is no rebound, no guarding and no CVA tenderness.  Musculoskeletal: Normal range of motion. She exhibits no edema or tenderness.  Lymphadenopathy:    She has no cervical adenopathy.  Neurological: She is alert and oriented to person, place, and time.  Skin: Skin is warm and dry. No rash noted. She is not diaphoretic. No erythema. No pallor.  Vitals reviewed.   Lab Results  Component Value Date   WBC 5.5 12/18/2016   HGB 14.7 12/18/2016   HCT 43.4 12/18/2016   PLT 226.0 12/18/2016   GLUCOSE 100 (H) 12/18/2016   CHOL 170 01/10/2016   TRIG 93.0 01/10/2016   HDL 67.30 01/10/2016   LDLDIRECT 132.2 06/10/2012   LDLCALC 84 01/10/2016   ALT 32 12/18/2016   AST 28 12/18/2016   NA 140 12/18/2016   K 3.3 (L) 12/18/2016   CL 99 12/18/2016   CREATININE 0.63 12/18/2016   BUN 10 12/18/2016   CO2 34 (H) 12/18/2016   TSH 3.06 12/18/2016   HGBA1C 5.9 12/26/2006    Dg Chest 2 View  Result Date: 05/23/2016 CLINICAL DATA:  Cough, shortness of breath, fever, and fatigue for 2 weeks. EXAM: CHEST  2 VIEW COMPARISON:  03/05/2015 FINDINGS: The heart size and mediastinal contours are within normal limits. Aortic atherosclerosis. Both lungs are clear. The visualized skeletal structures are unremarkable. Mild bibasilar scarring is stable. No evidence of pulmonary infiltrate or edema. No evidence of pleural effusion. Mild upper and mid thoracic spine degenerative changes noted. IMPRESSION: Stable mild bibasilar scarring.  No active cardiopulmonary disease. Aortic atherosclerosis. Electronically Signed   By: Earle Gell M.D.   On: 05/23/2016 11:57   Dg Abd Acute W/chest  Result Date: 12/18/2016 CLINICAL DATA:  Recent hematuria and abdominal pain. History of kidney stones. EXAM: DG ABDOMEN ACUTE W/ 1V CHEST COMPARISON:  Chest radiograph 05/23/2016, CT of the abdomen  pelvis 10/27/2015 FINDINGS: There is no evidence of dilated bowel loops or free intraperitoneal air. Three large calculi are again seen in the lower pole of the left kidney, the largest measuring 15 mm. Prior tubal ligation. Osteoarthritic changes of the spine. Heart size and mediastinal contours are within normal limits. 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 Both lungs are clear. IMPRESSION: Nonobstructive bowel gas pattern. Left nephrolithiasis, stable from comparison CT. No acute cardiopulmonary disease. 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 Electronically Signed   By: Fidela Salisbury M.D.   On: 12/18/2016 17:30    Assessment & Plan:   Anelisse was seen today for hypertension.  Diagnoses and all orders for this visit:  Hematuria, unspecified type- urine culture is pending, will treat if indicated, plain film shows left nephrolithiasis but no stones in the bladder or ureters. She feels like it's time to follow with her urologist so I will send a referral. -     POCT Urinalysis Dipstick (Automated) -     CULTURE, URINE COMPREHENSIVE;  Future  Hematuria, gross -     DG Abd Acute W/Chest; Future -     CULTURE, URINE COMPREHENSIVE; Future  Essential hypertension- Her blood pressure is not adequately well controlled, will add an ARB to the thiazide diuretic. -     CBC with Differential/Platelet; Future -     Comprehensive metabolic panel; Future -     Thyroid Panel With TSH; Future -     potassium chloride (KLOR-CON) 20 MEQ packet; Take 20 mEq by mouth 2 (two) times daily. -     telmisartan (MICARDIS) 40 MG tablet; Take 1 tablet (40 mg total) by mouth daily.  Other iron deficiency anemia- improvement noted -     CBC with Differential/Platelet; Future  DOE (dyspnea on exertion)- her EKG is negative for LVH or ischemia, her cardiac enzymes are negative for ischemia, her d-dimer though it slightly elevated is reassuring that her symptoms  are not related to PE, she has a systolic murmur consistent with aortic stenosis and she is anticipating surgery soon so will go ahead and order an echocardiogram to see how severe this is. -     CBC with Differential/Platelet; Future -     Troponin I; Future -     Brain natriuretic peptide; Future -     Cardiac panel; Future -     EKG 12-Lead -     D-dimer, quantitative (not at [facility]); Future  RENAL CALCULUS, HX OF -     DG Abd Acute W/Chest; Future  Tachycardia, unspecified- her EKG today is not significantly changed compared to prior EKG with respect to the heart rate elevation, her labs are reassuring that she doesn't have any secondary metabolic causes for an elevated heart rate. Will go ahead and get an echocardiogram to screen for valvular, structural, or wall motion abnormalities. -     D-dimer, quantitative (not at [facility]); Future -     Thyroid Panel With TSH; Future  Hypokalemia- this is related to the thiazide diuretic, will treat with oral potassium supplements. -     potassium chloride (KLOR-CON) 20 MEQ packet; Take 20 mEq by mouth 2 (two) times daily.   I am having Ms. Paxson start on potassium chloride and telmisartan. I am also having her maintain her calcium carbonate, cholecalciferol, fexofenadine, ALPRAZolam, hyoscyamine, hydrochlorothiazide, and rosuvastatin. We will continue to administer botulinum toxin Type A.  Meds ordered this encounter  Medications  . potassium chloride (KLOR-CON) 20 MEQ packet    Sig: Take 20 mEq by mouth 2 (two) times daily.    Dispense:  100 packet    Refill:  3  . telmisartan (MICARDIS) 40 MG tablet    Sig: Take 1 tablet (40 mg total) by mouth daily.    Dispense:  90 tablet    Refill:  1     Follow-up: Return in about 3 weeks (around 01/08/2017).  Katie Calico, MD

## 2016-12-18 NOTE — Patient Instructions (Signed)

## 2016-12-19 ENCOUNTER — Encounter: Payer: Self-pay | Admitting: Internal Medicine

## 2016-12-19 DIAGNOSIS — E876 Hypokalemia: Secondary | ICD-10-CM | POA: Insufficient documentation

## 2016-12-19 DIAGNOSIS — R011 Cardiac murmur, unspecified: Secondary | ICD-10-CM | POA: Insufficient documentation

## 2016-12-19 LAB — D-DIMER, QUANTITATIVE (NOT AT ARMC): D DIMER QUANT: 0.59 ug{FEU}/mL — AB (ref <0.50)

## 2016-12-19 MED ORDER — POTASSIUM CHLORIDE 20 MEQ PO PACK: 20.0000 meq | PACK | Freq: Two times a day (BID) | ORAL | 3 refills | Status: DC

## 2016-12-19 MED ORDER — TELMISARTAN 40 MG PO TABS: 40.0000 mg | ORAL_TABLET | Freq: Every day | ORAL | 1 refills | Status: DC

## 2016-12-19 NOTE — Telephone Encounter (Signed)
Dr. Ronnald Ramp sent rx for BP and a message to pt via my chart. Pt has seen my chart message. Closing note.

## 2016-12-20 ENCOUNTER — Encounter: Payer: Self-pay | Admitting: Internal Medicine

## 2016-12-20 LAB — CULTURE, URINE COMPREHENSIVE: ORGANISM ID, BACTERIA: NO GROWTH

## 2016-12-30 DIAGNOSIS — R490 Dysphonia: Secondary | ICD-10-CM | POA: Diagnosis not present

## 2017-01-02 DIAGNOSIS — Z961 Presence of intraocular lens: Secondary | ICD-10-CM | POA: Diagnosis not present

## 2017-01-02 DIAGNOSIS — M7582 Other shoulder lesions, left shoulder: Secondary | ICD-10-CM | POA: Diagnosis not present

## 2017-01-07 ENCOUNTER — Other Ambulatory Visit: Payer: Self-pay | Admitting: Orthopedic Surgery

## 2017-01-07 DIAGNOSIS — M25512 Pain in left shoulder: Principal | ICD-10-CM

## 2017-01-07 DIAGNOSIS — G8929 Other chronic pain: Secondary | ICD-10-CM

## 2017-01-09 ENCOUNTER — Ambulatory Visit (INDEPENDENT_AMBULATORY_CARE_PROVIDER_SITE_OTHER): Payer: Medicare Other | Admitting: Internal Medicine

## 2017-01-09 ENCOUNTER — Other Ambulatory Visit (INDEPENDENT_AMBULATORY_CARE_PROVIDER_SITE_OTHER): Payer: Medicare Other

## 2017-01-09 ENCOUNTER — Encounter: Payer: Self-pay | Admitting: Internal Medicine

## 2017-01-09 VITALS — BP 154/80 | HR 101 | Temp 98.3°F | Resp 16 | Ht 64.5 in | Wt 198.0 lb

## 2017-01-09 DIAGNOSIS — R310 Gross hematuria: Secondary | ICD-10-CM | POA: Diagnosis not present

## 2017-01-09 DIAGNOSIS — I10 Essential (primary) hypertension: Secondary | ICD-10-CM | POA: Diagnosis not present

## 2017-01-09 DIAGNOSIS — E876 Hypokalemia: Secondary | ICD-10-CM | POA: Diagnosis not present

## 2017-01-09 LAB — URINALYSIS, ROUTINE W REFLEX MICROSCOPIC
BILIRUBIN URINE: NEGATIVE
Ketones, ur: NEGATIVE
Leukocytes, UA: NEGATIVE
NITRITE: NEGATIVE
PH: 6 (ref 5.0–8.0)
Specific Gravity, Urine: <=1.005 — AB (ref 1.000–1.030)
TOTAL PROTEIN, URINE-UPE24: NEGATIVE
URINE GLUCOSE: NEGATIVE
UROBILINOGEN UA: 0.2 (ref 0.0–1.0)

## 2017-01-09 LAB — BASIC METABOLIC PANEL
BUN: 11 mg/dL (ref 6–23)
CHLORIDE: 99 meq/L (ref 96–112)
CO2: 33 mEq/L — ABNORMAL HIGH (ref 19–32)
Calcium: 10 mg/dL (ref 8.4–10.5)
Creatinine, Ser: 0.6 mg/dL (ref 0.40–1.20)
GFR: 104.55 mL/min (ref >60.00)
Glucose, Bld: 101 mg/dL — ABNORMAL HIGH (ref 70–99)
POTASSIUM: 3.9 meq/L (ref 3.5–5.1)
Sodium: 140 mEq/L (ref 135–145)

## 2017-01-09 LAB — MAGNESIUM: MAGNESIUM: 2.2 mg/dL (ref 1.5–2.5)

## 2017-01-09 MED ORDER — SPIRONOLACTONE 25 MG PO TABS: 25.0000 mg | ORAL_TABLET | Freq: Every day | ORAL | 0 refills | Status: DC

## 2017-01-09 NOTE — Patient Instructions (Signed)

## 2017-01-09 NOTE — Progress Notes (Signed)
Subjective:  Patient ID: Katie Stout, female    DOB: [birth year]  Age: 71 y.o. MRN: [id number]  CC: Hypertension   HPI Katie Stout presents for a BP check - She complains that the ARB caused fatigue, weakness, lightheadedness, and dizziness. She wants to stop taking it. She is also struggling with hypokalemia. She recently had a urinary UA that had a few red blood cells. She has had no recent episodes of abdominal or flank pain but does have an occasional cramp in her bladder. She plans to see her urologist soon regarding her history of kidney stones.  Outpatient Medications Prior to Visit  Medication Sig Dispense Refill  . ALPRAZolam (XANAX) 0.25 MG tablet Take 1 tablet (0.25 mg total) by mouth at bedtime as needed for sleep. 30 tablet 2  . calcium carbonate (TUMS CALCIUM FOR LIFE BONE) 750 MG chewable tablet Chew 2-4 tablets by mouth daily.      . cholecalciferol (VITAMIN D) 1000 UNITS tablet Take 1,000 Units by mouth daily.      . fexofenadine (ALLEGRA) 30 MG tablet Take 30 mg by mouth 2 (two) times daily.    . hyoscyamine (LEVSIN SL) 0.125 MG SL tablet Place 1 tablet (0.125 mg total) under the tongue every 4 (four) hours as needed for cramping. 30 tablet 4  . rosuvastatin (CRESTOR) 10 MG tablet TAKE 1 TABLET BY MOUTH DAILY. 90 tablet 1  . hydrochlorothiazide (HYDRODIURIL) 25 MG tablet TAKE 1 TABLET EVERY DAY 90 tablet 1  . potassium chloride (KLOR-CON) 20 MEQ packet Take 20 mEq by mouth 2 (two) times daily. 100 packet 3  . telmisartan (MICARDIS) 40 MG tablet Take 1 tablet (40 mg total) by mouth daily. 90 tablet 1   Facility-Administered Medications Prior to Visit  Medication Dose Route Frequency Provider Last Rate Last Dose  . botulinum toxin Type A (BOTOX) injection 200 Units  200 Units Intramuscular Once Tat, Rebecca S, DO        ROS Review of Systems  Constitutional: Positive for fatigue. Negative for chills, diaphoresis and unexpected weight change.  HENT: Positive for  voice change. Negative for trouble swallowing.   Eyes: Negative.  Negative for visual disturbance.  Respiratory: Negative for cough, chest tightness, shortness of breath and wheezing.   Cardiovascular: Negative for chest pain, palpitations and leg swelling.  Gastrointestinal: Negative for abdominal pain, blood in stool, constipation, diarrhea, nausea and vomiting.  Genitourinary: Negative for decreased urine volume, difficulty urinating, dysuria, flank pain, frequency, hematuria, pelvic pain and urgency.  Musculoskeletal: Negative.   Skin: Negative.   Neurological: Positive for dizziness and light-headedness. Negative for weakness, numbness and headaches.  Hematological: Negative.   Psychiatric/Behavioral: Negative.     Objective:  BP (!) 154/80 (BP Location: Left Arm, Patient Position: Sitting, Cuff Size: Normal)   Pulse (!) 101   Temp 98.3 F (36.8 C) (Oral)   Resp 16   Ht 5' 4.5" (1.638 m)   Wt 198 lb (89.8 kg)   SpO2 98%   BMI 33.46 kg/m   BP Readings from Last 3 Encounters:  01/09/17 (!) 154/80  12/18/16 (!) 150/80  12/17/16 (!) 152/94    Wt Readings from Last 3 Encounters:  01/09/17 198 lb (89.8 kg)  12/18/16 196 lb (88.9 kg)  12/17/16 197 lb (89.4 kg)    Physical Exam  Constitutional: She is oriented to person, place, and time. No distress.  HENT:  Mouth/Throat: Oropharynx is clear and moist. No oropharyngeal exudate.  Eyes: Conjunctivae are normal.  Right eye exhibits no discharge. Left eye exhibits no discharge. No scleral icterus.  Neck: Normal range of motion. Neck supple. No JVD present. No thyromegaly present.  Cardiovascular: Normal rate, regular rhythm and intact distal pulses.  Exam reveals no gallop and no friction rub.   No murmur heard. Pulmonary/Chest: Effort normal and breath sounds normal. No respiratory distress. She has no wheezes. She has no rales. She exhibits no tenderness.  Abdominal: Soft. Bowel sounds are normal. She exhibits no distension  and no mass. There is no tenderness. There is no rebound and no guarding.  Musculoskeletal: Normal range of motion. She exhibits no edema, tenderness or deformity.  Lymphadenopathy:    She has no cervical adenopathy.  Neurological: She is alert and oriented to person, place, and time.  Skin: Skin is warm and dry. No rash noted. She is not diaphoretic. No erythema. No pallor.  Vitals reviewed.   Lab Results  Component Value Date   WBC 5.5 12/18/2016   HGB 14.7 12/18/2016   HCT 43.4 12/18/2016   PLT 226.0 12/18/2016   GLUCOSE 101 (H) 01/09/2017   CHOL 170 01/10/2016   TRIG 93.0 01/10/2016   HDL 67.30 01/10/2016   LDLDIRECT 132.2 06/10/2012   LDLCALC 84 01/10/2016   ALT 32 12/18/2016   AST 28 12/18/2016   NA 140 01/09/2017   K 3.9 01/09/2017   CL 99 01/09/2017   CREATININE 0.60 01/09/2017   BUN 11 01/09/2017   CO2 33 (H) 01/09/2017   TSH 3.06 12/18/2016   HGBA1C 5.9 12/26/2006    Dg Abd Acute W/chest  Result Date: 12/18/2016 CLINICAL DATA:  Recent hematuria and abdominal pain. History of kidney stones. EXAM: DG ABDOMEN ACUTE W/ 1V CHEST COMPARISON:  Chest radiograph 05/23/2016, CT of the abdomen pelvis 10/27/2015 FINDINGS: There is no evidence of dilated bowel loops or free intraperitoneal air. Three large calculi are again seen in the lower pole of the left kidney, the largest measuring 15 mm. Prior tubal ligation. Osteoarthritic changes of the spine. Heart size and mediastinal contours are within normal limits. 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 Both lungs are clear. IMPRESSION: Nonobstructive bowel gas pattern. Left nephrolithiasis, stable from comparison CT. No acute cardiopulmonary disease. 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 Electronically Signed   By: Fidela Salisbury M.D.   On: 12/18/2016 17:30    Assessment & Plan:   Katie Stout was seen today for hypertension.  Diagnoses and all orders for this  visit:  Hypokalemia- her potassium and magnesium levels are normal now, will change to a potassium sparing diuretic. -     Magnesium; Future -     Basic metabolic panel; Future -     spironolactone (ALDACTONE) 25 MG tablet; Take 1 tablet (25 mg total) by mouth daily.  Hematuria, gross- she continues to have a trace of hematuria and agrees to see her urologist. -     Urinalysis, Routine w reflex microscopic; Future  Essential hypertension- her blood pressure is not adequately well controlled, she did not tolerate the ARB, she does struggle with hypokalemia so I've asked her to change to a potassium sparing diuretic. -     spironolactone (ALDACTONE) 25 MG tablet; Take 1 tablet (25 mg total) by mouth daily.   I have discontinued Katie Stout's hydrochlorothiazide, potassium chloride, and telmisartan. I am also having her start on spironolactone. Additionally, I am having her maintain her calcium carbonate, cholecalciferol, fexofenadine, ALPRAZolam, hyoscyamine, and rosuvastatin. We will continue to administer  botulinum toxin Type A.  Meds ordered this encounter  Medications  . spironolactone (ALDACTONE) 25 MG tablet    Sig: Take 1 tablet (25 mg total) by mouth daily.    Dispense:  90 tablet    Refill:  0     Follow-up: Return in about 2 months (around 03/12/2017).  [name], MD

## 2017-01-13 DIAGNOSIS — M25512 Pain in left shoulder: Secondary | ICD-10-CM | POA: Diagnosis not present

## 2017-01-16 DIAGNOSIS — M25512 Pain in left shoulder: Secondary | ICD-10-CM | POA: Diagnosis not present

## 2017-01-21 DIAGNOSIS — M25512 Pain in left shoulder: Secondary | ICD-10-CM | POA: Diagnosis not present

## 2017-01-22 ENCOUNTER — Telehealth (HOSPITAL_COMMUNITY): Payer: Self-pay | Admitting: Internal Medicine

## 2017-01-22 NOTE — Telephone Encounter (Signed)
User: Cherie Dark A Date/time: 01/16/2017 11:20 AM  Comment: Called pt and lmsg for her to CB to schedule echo.   Context: [facility] Schedule Orders/Appt Requests Outcome: Left Message  Phone number: [phone] Phone Type: Home Phone  Comm. type: Telephone Call type: Outgoing  Contact: Daine Gip Relation to patient: Self  Letter:      User: Cherie Dark A Date/time: 12/20/2016 9:54 AM  Comment: Called pt and lmsg for her to CB to get scheduled for echo.   Context: [facility] Schedule Orders/Appt Requests Outcome: Left Message  Phone number: [phone] Phone Type: Home Phone  Comm. type: Telephone Call type: Outgoing  Contact: Losh, Fleur Audino Relation to patient: Self  01/03/2017 lmom for pt to return call to schedule echo/lm

## 2017-01-23 ENCOUNTER — Ambulatory Visit
Admission: RE | Admit: 2017-01-23 | Discharge: 2017-01-23 | Disposition: A | Discharge status: Home / Self Care | Payer: Medicare Other | Source: Ambulatory Visit | Attending: Orthopedic Surgery | Admitting: Orthopedic Surgery

## 2017-01-23 DIAGNOSIS — M25511 Pain in right shoulder: Secondary | ICD-10-CM | POA: Diagnosis not present

## 2017-01-23 DIAGNOSIS — M25512 Pain in left shoulder: Principal | ICD-10-CM

## 2017-01-23 DIAGNOSIS — G8929 Other chronic pain: Secondary | ICD-10-CM

## 2017-01-24 DIAGNOSIS — M25512 Pain in left shoulder: Secondary | ICD-10-CM | POA: Diagnosis not present

## 2017-01-29 DIAGNOSIS — E8589 Other amyloidosis: Secondary | ICD-10-CM | POA: Diagnosis not present

## 2017-01-29 DIAGNOSIS — I10 Essential (primary) hypertension: Secondary | ICD-10-CM | POA: Diagnosis not present

## 2017-01-29 DIAGNOSIS — E854 Organ-limited amyloidosis: Secondary | ICD-10-CM | POA: Diagnosis not present

## 2017-01-29 DIAGNOSIS — E7800 Pure hypercholesterolemia, unspecified: Secondary | ICD-10-CM | POA: Diagnosis not present

## 2017-01-29 DIAGNOSIS — E859 Amyloidosis, unspecified: Secondary | ICD-10-CM | POA: Diagnosis not present

## 2017-01-29 DIAGNOSIS — J387 Other diseases of larynx: Secondary | ICD-10-CM | POA: Diagnosis not present

## 2017-02-05 DIAGNOSIS — M25512 Pain in left shoulder: Secondary | ICD-10-CM | POA: Diagnosis not present

## 2017-02-06 DIAGNOSIS — M25512 Pain in left shoulder: Secondary | ICD-10-CM | POA: Diagnosis not present

## 2017-02-10 DIAGNOSIS — M25512 Pain in left shoulder: Secondary | ICD-10-CM | POA: Diagnosis not present

## 2017-02-11 ENCOUNTER — Telehealth: Payer: Self-pay | Admitting: Internal Medicine

## 2017-02-11 DIAGNOSIS — R310 Gross hematuria: Secondary | ICD-10-CM | POA: Diagnosis not present

## 2017-02-11 DIAGNOSIS — K8020 Calculus of gallbladder without cholecystitis without obstruction: Secondary | ICD-10-CM | POA: Diagnosis not present

## 2017-02-11 DIAGNOSIS — N200 Calculus of kidney: Secondary | ICD-10-CM | POA: Diagnosis not present

## 2017-02-11 NOTE — Telephone Encounter (Signed)
Pt called in and said that she has gained 4 pound and very puffy.  She is retaining fluid.  She said that the spironolactone (ALDACTONE) 25 MG tablet [[id number]]  Helps her with her bp but she is retaining a lot of fluid    Best number [phone]

## 2017-02-12 NOTE — Telephone Encounter (Signed)
We sent a 90 day supply on 01/09/2017. Can you ask patient if she picked up that rx?

## 2017-02-12 NOTE — Telephone Encounter (Signed)
I have spoken with patient.  She states she does not need refill.  She states that she believes this medication is causing her to retain fluid and causing her to have a sore throat.  States that the medication has done great with controlling her blood pressure.  States blood pressure has been running 135/70 to 141/75. Patient states she was taking hydrochlorothiazide along with potassium at one time but was taken off due to something in regard to the potassium.  Patient states she does have enough hydrochlorothiazide to get her through until Sept 4th.   She would like to know if Dr. Ronnald Ramp suggest she go back on this either with or w/out the potassium?

## 2017-02-12 NOTE — Telephone Encounter (Signed)
LVM for pt to call back as soon as possible.   

## 2017-02-12 NOTE — Telephone Encounter (Signed)
I don't think spironolactone is causing her symptoms She needs to have those symptoms evaluated If she restarts HCTZ then she may develop a low K+ again

## 2017-02-13 ENCOUNTER — Telehealth: Payer: Self-pay | Admitting: Internal Medicine

## 2017-02-13 DIAGNOSIS — M25512 Pain in left shoulder: Secondary | ICD-10-CM | POA: Diagnosis not present

## 2017-02-13 NOTE — Telephone Encounter (Signed)
Error

## 2017-02-13 NOTE — Telephone Encounter (Signed)
After speaking with Stefannie, I have advised patient to come in to see Dr. Ronnald Ramp for evaluation.  Patient stated that she did feel better.  She stated that she was going to continue the spironolactone until her September appt.  States she is going to cut out salt and will call if needed.

## 2017-02-13 NOTE — Telephone Encounter (Signed)
Pt called yesterday during the systems being down, called back to tell her systems are up and to call back

## 2017-02-14 ENCOUNTER — Ambulatory Visit (INDEPENDENT_AMBULATORY_CARE_PROVIDER_SITE_OTHER): Payer: Medicare Other | Admitting: Neurology

## 2017-02-14 DIAGNOSIS — G243 Spasmodic torticollis: Secondary | ICD-10-CM

## 2017-02-14 DIAGNOSIS — M542 Cervicalgia: Secondary | ICD-10-CM | POA: Diagnosis not present

## 2017-02-14 NOTE — Procedures (Signed)
[facility]   Procedure Note Botox  Attending: Dr. Wells Guiles Alekai Pocock  Preoperative Diagnosis(es): Cervical Dystonia  Result History  Onset of effect: unknown Duration of Benefit: having neck pain but "my neck is straighter than it has ever been."  Feels that her neck pain is ruining her life Adverse Effects: dysphagia  Consent obtained from: The patient Benefits discussed included, but were not limited to decreased muscle tightness, increased joint range of motion, and decreased pain.  Risk discussed included, but were not limited pain and discomfort, bleeding, bruising, excessive weakness, venous thrombosis, muscle atrophy and dysphagia.  A copy of the patient medication guide was given to the patient which explains the blackbox warning.  Patients identity and treatment sites confirmed Yes.  .  Details of Procedure: Skin was cleaned with alcohol.  A 30 gauge, 1/2 inch needle was introduced to the target muscle (except splenius capitus, posterior approach, where 27 inch, 1 1/2 gauge needle was used).  Prior to injection, the needle plunger was aspirated to make sure the needle was not within a blood vessel.  There was no blood retrieved on aspiration.    Following is a summary of the muscles injected  And the amount of Botulinum toxin used:   Dilution 0.9% preservative free saline mixed with 100 u Botox type A to make 10 U per 0.1cc  Injections  Location Left  Right Units Number of sites        Sternocleidomastoid 50/20  70 2  Splenius Capitus, posterior approach  60 60 1  Splenius Capitus, lateral approach  40 40 1  Levator Scapulae 10  10 1   Trapezius 20/20/20/2010  50 3        TOTAL UNITS:   270    Agent: Botulinum Type A ( Onobotulinum Toxin type A ).  2 vials of Botox were used, each containing 100 units and freshly diluted with 1 mL of sterile, non-preserved saline   Total injected (Units): 270  Total wasted (Units): 30   Pt tolerated procedure well without  complications.   Reinjection is anticipated in 3 months.  Clinical comment:  Referral sent to Dr. Letta Pate

## 2017-02-18 DIAGNOSIS — M25512 Pain in left shoulder: Secondary | ICD-10-CM | POA: Diagnosis not present

## 2017-02-21 DIAGNOSIS — M25512 Pain in left shoulder: Secondary | ICD-10-CM | POA: Diagnosis not present

## 2017-02-24 DIAGNOSIS — J384 Edema of larynx: Secondary | ICD-10-CM | POA: Diagnosis not present

## 2017-02-24 DIAGNOSIS — J385 Laryngeal spasm: Secondary | ICD-10-CM | POA: Diagnosis not present

## 2017-02-24 DIAGNOSIS — R490 Dysphonia: Secondary | ICD-10-CM | POA: Diagnosis not present

## 2017-02-24 DIAGNOSIS — E854 Organ-limited amyloidosis: Secondary | ICD-10-CM | POA: Diagnosis not present

## 2017-02-24 DIAGNOSIS — E859 Amyloidosis, unspecified: Secondary | ICD-10-CM | POA: Diagnosis not present

## 2017-02-24 DIAGNOSIS — J383 Other diseases of vocal cords: Secondary | ICD-10-CM | POA: Diagnosis not present

## 2017-02-25 DIAGNOSIS — M25512 Pain in left shoulder: Secondary | ICD-10-CM | POA: Diagnosis not present

## 2017-02-27 DIAGNOSIS — M7582 Other shoulder lesions, left shoulder: Secondary | ICD-10-CM | POA: Diagnosis not present

## 2017-02-28 DIAGNOSIS — M25512 Pain in left shoulder: Secondary | ICD-10-CM | POA: Diagnosis not present

## 2017-03-04 ENCOUNTER — Encounter: Payer: Self-pay | Admitting: Physical Medicine & Rehabilitation

## 2017-03-04 ENCOUNTER — Ambulatory Visit (HOSPITAL_BASED_OUTPATIENT_CLINIC_OR_DEPARTMENT_OTHER): Payer: Medicare Other | Admitting: Physical Medicine & Rehabilitation

## 2017-03-04 ENCOUNTER — Encounter: Payer: Medicare Other | Attending: Physical Medicine & Rehabilitation

## 2017-03-04 VITALS — BP 162/103 | HR 114

## 2017-03-04 DIAGNOSIS — M791 Myalgia: Secondary | ICD-10-CM

## 2017-03-04 DIAGNOSIS — F419 Anxiety disorder, unspecified: Secondary | ICD-10-CM | POA: Insufficient documentation

## 2017-03-04 DIAGNOSIS — M542 Cervicalgia: Secondary | ICD-10-CM | POA: Insufficient documentation

## 2017-03-04 DIAGNOSIS — M7918 Myalgia, other site: Secondary | ICD-10-CM

## 2017-03-04 DIAGNOSIS — M436 Torticollis: Secondary | ICD-10-CM

## 2017-03-04 DIAGNOSIS — G8929 Other chronic pain: Secondary | ICD-10-CM | POA: Insufficient documentation

## 2017-03-04 DIAGNOSIS — G248 Other dystonia: Secondary | ICD-10-CM | POA: Insufficient documentation

## 2017-03-04 DIAGNOSIS — K219 Gastro-esophageal reflux disease without esophagitis: Secondary | ICD-10-CM | POA: Insufficient documentation

## 2017-03-04 DIAGNOSIS — I10 Essential (primary) hypertension: Secondary | ICD-10-CM | POA: Insufficient documentation

## 2017-03-04 DIAGNOSIS — M25512 Pain in left shoulder: Secondary | ICD-10-CM

## 2017-03-04 DIAGNOSIS — M7542 Impingement syndrome of left shoulder: Secondary | ICD-10-CM | POA: Insufficient documentation

## 2017-03-04 NOTE — Progress Notes (Signed)
Subjective:    Patient ID: Katie Stout, female    DOB: Nov 17, 1945, 71 y.o.   MRN: [id number]  HPI CC Neck pain  Hx of cervical dystonia diagnosed 13yrs ago.  Initially this was just causing abnormal head positioning but over the last 4-5 years, it has been causing neck pain.  Has been getting botulinum toxin injections for ~20yrs. In addition to her neck pain. She does have bilateral shoulder pain. He has had bilateral shoulder MRIs, on the left. She has subdeltoid bursitis, significant acromioclavicular joint arthritis as well as biceps tendinopathy On the right side. She has right subscapularis tendon tear, full-thickness, as well as degeneration of the right bicipital tendon  Aleve helps neck pain and Bilateral shoulder  C Spine MRI Shows facet arthropathy C2-3 and C4-5, C3-4 is auto fused There is C6 lateral recess stenosis bilaterally  Currently undergoing physical therapy for Left shoulder pain. Previously had PT for neck pain about 2 years ago but it felt worse  No history of cervical injections  PMH also significant for Laryngeal amyloid, laryngeal mass excision 2013  Pain Inventory Average Pain 6 Pain Right Now 5 My pain is intermittent, sharp, dull and aching  In the last 24 hours, has pain interfered with the following? General activity 5 Relation with others 7 Enjoyment of life 7 What TIME of day is your pain at its worst? daytime Sleep (in general) Fair  Pain is worse with: walking, bending and sitting Pain improves with: . Relief from Meds: 3  Mobility walk without assistance ability to climb steps?  yes do you drive?  yes  Function retired  Neuro/Psych weakness  Prior Studies Any changes since last visit?  no MRI CERVICAL SPINE WITHOUT CONTRAST  TECHNIQUE: Multiplanar, multisequence MR imaging of the cervical spine was performed. No intravenous contrast was administered.  COMPARISON:  CT scan dated 03/10/2015 power radiographs  dated 02/19/2014 and MRI dated 09/27/2014  FINDINGS: Alignment: Chronic 2 mm retrolisthesis of C5 on C6.  Vertebrae: No fractures or bone destruction. Prominent cyst in the posterior aspect of C6 to the left of midline containing gas.  Cord: Normal.  Posterior Fossa, vertebral arteries, paraspinal tissues: Normal.  Disc levels:  Craniocervical junction through C2-3: Severe left facet arthritis at C2-3. Normal disc at C2-3. Moderate chronic degenerative changes between the anterior arch of C1 and the odontoid process. Degenerative changes of the right lateral mass of C1.  C3-4: Auto fusion of the facet joints.  The disc is normal.  C4-5: Normal disc. Moderate left facet arthritis and slight right facet arthritis. No foraminal stenosis.  C5-6: Chronic degenerative disc disease with a small broad-based disc osteophyte complex which extends into both lateral recesses without severe foraminal stenosis.  C6-7: Chronic degenerative disc disease with a vacuum phenomenon with gas in the degenerative cyst in the posterior aspect of C6. This has progressed since the prior CT scan. Uncinate spurs narrow the right neural foramen.  C7-T1:  Slight left facet arthritis.  Normal disc.  IMPRESSION: 1. 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 2. Chronic degenerative disc disease primarily at C5-6 and C6-7 with narrowing of both lateral recesses at C5-6 which could affect the C6 nerves. 3. Moderate degenerative changes between the anterior arch of C1 and the odontoid process.   Electronically Signed   By: Lorriane Shire M.D.   On: 02/12/2016 16:38  MRI OF THE LEFT SHOULDER WITHOUT CONTRAST  TECHNIQUE: Multiplanar, multisequence MR imaging of the shoulder was performed. No intravenous contrast  was administered.  COMPARISON:  None.  FINDINGS: Rotator cuff: An interstitial tear in the posterior fibers of the infraspinatus measures 0.3 cm. Small interstitial tear is  also seen in the subscapularis. No full-thickness tear or retracted tendon.  Muscles:  Normal without atrophy or focal lesion.  Biceps long head: Marked tendinopathy is worst in the intra-articular segment. No tear.  Acromioclavicular Joint: Moderately severe degenerative change is present with marked marrow edema about the joint. Type 1 acromion. Fluid is seen in the subacromial/subdeltoid bursa.  Glenohumeral Joint: Mild degenerative change is seen.  Labrum:  Intact.  Bones:  No fracture or worrisome lesion.  Other: None.  IMPRESSION: Severe rotator cuff and long head of biceps tendinopathy. Small interstitial tears are seen in the infraspinatus and subscapularis but there is no full-thickness tear or retracted tendon.  Moderately severe acromioclavicular osteoarthritis with marrow edema about the joint.  Subacromial/subdeltoid bursitis.   Electronically Signed   By: Inge Rise M.D.   On: 01/23/2017 13:18  MRI OF THE RIGHT SHOULDER WITHOUT CONTRAST  TECHNIQUE: Multiplanar, multisequence MR imaging of the shoulder was performed. No intravenous contrast was administered.  COMPARISON:  None.  FINDINGS: Rotator cuff: There is an intrasubstance tear at the musculotendinous junction of the infraspinatus which does not appear to extend to the articular or bursal surfaces. This is best seen on series 7. Chronic degenerative changes and hypertrophy of the distal supraspinous tendon. Focal full-thickness tear of the superior fibers of the subscapularis best seen on images 12 of series 7. The long head of the biceps tendon has subluxed into the torn subscapularis.  Muscles:  Normal.  Biceps long head: Diffuse degeneration of the intra-articular portion of the long head of the biceps tendon. The tendon is hypertrophied and frayed at the superior aspect of the bicipital groove.  Acromioclavicular Joint: Minimal arthropathy. Normal type  2 acromion. Slight inflammation of the subacromial/subdeltoid bursa.  Glenohumeral Joint: Small joint effusion.  Labrum:  Normal.  Bones: Small degenerative changes of the greater and lesser tuberosities of the proximal humerus.  IMPRESSION: Focal full-thickness tear of the superior aspect of the subscapularis tendon with subluxation of the severely degenerated long head of the biceps tendon into the tear.  Intrasubstance musculotendinous tear of the infraspinatus.   Electronically Signed   By: Lorriane Shire M.D.   On: 02/12/2016 16:51 Physicians involved in your care Any changes since last visit?  no   Family History  Problem Relation Age of Onset  . Coronary artery disease Mother   . Heart attack Mother   . Breast cancer Mother   . Colon cancer Mother   . Hypertension Father   . Thrombocytopenia Father   . Other Father        ITP  . Prostate cancer Brother   . AAA (abdominal aortic aneurysm) Brother    Social History   Social History  . Marital status: Widowed    Spouse name: N/A  . Number of children: 1  . Years of education: N/A   Occupational History  . retired    Social History Main Topics  . Smoking status: Never Smoker  . Smokeless tobacco: Never Used  . Alcohol use No  . Drug use: No  . Sexual activity: Not on file   Other Topics Concern  . Not on file   Social History Narrative   HSG   Married '68-22 years; Divorced   1 Daughter - '85; no grandchildren   Work: PT for [employer]  Lives: Alone, but adjoins daughter   Past Surgical History:  Procedure Laterality Date  . ABDOMINAL HYSTERECTOMY  1993   complete  . CATARACT EXTRACTION, BILATERAL  2007  . CESAREAN SECTION    . COLONOSCOPY  02/10/2013  . ESOPHAGOGASTRODUODENOSCOPY  02/10/2013  . ESOPHAGOSCOPY  04/14/2012   Procedure: ESOPHAGOSCOPY;  Surgeon: Rozetta Nunnery, MD;  Location: [facility];  Service: ENT;  Laterality: Right;  . LARYNX SURGERY   11/2011   vocal cord biopsy  . NASAL HEMORRHAGE CONTROL  04/14/2012   Procedure: EPISTAXIS CONTROL;  Surgeon: Rozetta Nunnery, MD;  Location: [facility];  Service: ENT;  Laterality: Right;  Cauterization of Right Septal Vessel  . OOPHORECTOMY  1992   x 1  . ORIF ULNAR FRACTURE  01/16/2000   left: also radial head dislocation  . 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 Anxiety   . Complication of anesthesia    states had flashing lights x 6 wks. post-op in 1992  . Dental crowns present   . Difficulty swallowing solids    states difficulty swallowing dry foods  . Diverticulosis of colon (without mention of hemorrhage)   . Eczema   . GERD (gastroesophageal reflux disease)   . H. pylori infection   . Hiatal hernia    states "small"  . History of kidney stones   . Hypertension    under control, has been on med. x 7 yrs.  . 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 Other and unspecified hyperlipidemia   . Palpitations    "all my life" - states no problems; states had stress test 2006; no cardiologist  . Post-nasal drip 04/10/2012  . Rash    breasts  . Spasmodic torticollis    has difficulty turning head to left side; receives Botox injections   There were no vitals taken for this visit.  Opioid Risk Score:   Fall Risk Score:  `1  Depression screen PHQ 2/9  Depression screen [facility] 2/9 10/23/2015 05/11/2014 02/25/2014  Decreased Interest 0 0 1  Down, Depressed, Hopeless 0 0 1  PHQ - 2 Score 0 0 2  Altered sleeping - - 0  Tired, decreased energy - - 1  Change in appetite - - 0  Feeling bad or failure about yourself  - - 0  Trouble concentrating - - 0  Suicidal thoughts - - 0  PHQ-9 Score - - 3     Review of Systems  Constitutional: Positive for diaphoresis and unexpected weight change.  HENT: Negative.   Eyes: Negative.   Respiratory: Positive for shortness of breath.    Cardiovascular: Negative.   Gastrointestinal: Negative.   Endocrine: Negative.   Genitourinary: Negative.   Musculoskeletal: Negative.   Skin: Positive for rash.  Allergic/Immunologic: Negative.   Neurological: Negative.   Hematological: Negative.   Psychiatric/Behavioral: Negative.   All other systems reviewed and are negative.      Objective:   Physical Exam  Constitutional: She is oriented to person, place, and time. She appears well-developed and well-nourished.  HENT:  Head: Normocephalic and atraumatic.  Eyes: Pupils are equal, round, and reactive to light. Conjunctivae and EOM are normal.  Neck: No JVD present.  Cervical range of motion is reduced  Cardiovascular: Normal rate and regular rhythm.   Pulmonary/Chest: Effort normal and breath sounds normal. No stridor. No respiratory distress.  Abdominal: Soft. Bowel sounds are  normal.  Neurological: She is alert and oriented to person, place, and time. She exhibits abnormal muscle tone. Coordination and gait normal.  Reflex Scores:      Tricep reflexes are 2+ on the right side and 2+ on the left side.      Bicep reflexes are 2+ on the right side and 2+ on the left side.      Brachioradialis reflexes are 2+ on the right side and 2+ on the left side.      Patellar reflexes are 2+ on the right side and 2+ on the left side.      Achilles reflexes are 2+ on the right side and 2+ on the left side. Motor strength is 5/5 bilateral deltoid by stress, grip, hip flexion, knee extension and dorsal flexion  Evidence of cervical dystonia  Psychiatric: She has a normal mood and affect. Her behavior is normal. Judgment and thought content normal.  Nursing note and vitals reviewed.   Sagittal shift towards right, head tilt toward left, mild prominence of left sternocleidomastoid muscle.  Head rotation is 50% to the right and less than 10% to the left      Assessment & Plan:  1. Chronic neck pain, likely multifactorial,, complicated  by shoulder pain, which may also be multifactorial. She has cervical spondylosis and cervical degenerative disc. While her know her own exam is negative. She does have some radiating pain down the left shoulder, which could be intermittent radicular pain. In addition, she has left acromioclavicular joint arthropathy, as well as left subdeltoid bursitis. She does have trigger point tenderness over the left trapezius, although this may also be part of her cervical dystonia. Patient may benefit from a higher Botox dose in her trapezius  Will do trigger point injections today  As discussed with patient. Will try to identify pain generators by anesthetizing painful structures. Next visit. Plan on acromioclavicular joint versus subacromial bursa injection under ultrasound guidance

## 2017-03-04 NOTE — Patient Instructions (Signed)
We did trigger point injections today to the left trapezius, this is only partially helpful, it may mean that the bursitis and arthritis in your Left shoulder may be causing significant pain.

## 2017-03-05 DIAGNOSIS — M25512 Pain in left shoulder: Secondary | ICD-10-CM | POA: Diagnosis not present

## 2017-03-07 DIAGNOSIS — M25512 Pain in left shoulder: Secondary | ICD-10-CM | POA: Diagnosis not present

## 2017-03-11 DIAGNOSIS — M25512 Pain in left shoulder: Secondary | ICD-10-CM | POA: Diagnosis not present

## 2017-03-12 ENCOUNTER — Ambulatory Visit (INDEPENDENT_AMBULATORY_CARE_PROVIDER_SITE_OTHER): Payer: Medicare Other | Admitting: Internal Medicine

## 2017-03-12 ENCOUNTER — Other Ambulatory Visit (INDEPENDENT_AMBULATORY_CARE_PROVIDER_SITE_OTHER): Payer: Medicare Other

## 2017-03-12 ENCOUNTER — Encounter: Payer: Self-pay | Admitting: Internal Medicine

## 2017-03-12 VITALS — BP 147/74 | HR 95 | Temp 98.1°F | Resp 16 | Ht 64.45 in | Wt 199.0 lb

## 2017-03-12 DIAGNOSIS — Z23 Encounter for immunization: Secondary | ICD-10-CM | POA: Diagnosis not present

## 2017-03-12 DIAGNOSIS — E785 Hyperlipidemia, unspecified: Secondary | ICD-10-CM | POA: Diagnosis not present

## 2017-03-12 DIAGNOSIS — D508 Other iron deficiency anemias: Secondary | ICD-10-CM

## 2017-03-12 DIAGNOSIS — R0609 Other forms of dyspnea: Secondary | ICD-10-CM

## 2017-03-12 DIAGNOSIS — I10 Essential (primary) hypertension: Secondary | ICD-10-CM | POA: Diagnosis not present

## 2017-03-12 DIAGNOSIS — R0600 Dyspnea, unspecified: Secondary | ICD-10-CM

## 2017-03-12 LAB — LIPID PANEL
CHOLESTEROL: 152 mg/dL (ref 0–200)
HDL: 54.7 mg/dL (ref >39.00)
LDL Cholesterol: 77 mg/dL (ref 0–99)
NONHDL: 97.62
Total CHOL/HDL Ratio: 3
Triglycerides: 105 mg/dL (ref 0.0–149.0)
VLDL: 21 mg/dL (ref 0.0–40.0)

## 2017-03-12 LAB — CBC WITH DIFFERENTIAL/PLATELET
BASOS ABS: 0 10*3/uL (ref 0.0–0.1)
BASOS PCT: 0.5 % (ref 0.0–3.0)
Eosinophils Absolute: 0.4 10*3/uL (ref 0.0–0.7)
Eosinophils Relative: 6.9 % — ABNORMAL HIGH (ref 0.0–5.0)
HEMATOCRIT: 44.6 % (ref 36.0–46.0)
Hemoglobin: 14.9 g/dL (ref 12.0–15.0)
LYMPHS PCT: 16.7 % (ref 12.0–46.0)
Lymphs Abs: 0.9 10*3/uL (ref 0.7–4.0)
MCHC: 33.4 g/dL (ref 30.0–36.0)
MCV: 91.4 fl (ref 78.0–100.0)
MONOS PCT: 7.6 % (ref 3.0–12.0)
Monocytes Absolute: 0.4 10*3/uL (ref 0.1–1.0)
NEUTROS ABS: 3.6 10*3/uL (ref 1.4–7.7)
Neutrophils Relative %: 68.3 % (ref 43.0–77.0)
PLATELETS: 217 10*3/uL (ref 150.0–400.0)
RBC: 4.88 Mil/uL (ref 3.87–5.11)
RDW: 14.3 % (ref 11.5–15.5)
WBC: 5.3 10*3/uL (ref 4.0–10.5)

## 2017-03-12 LAB — BASIC METABOLIC PANEL
BUN: 9 mg/dL (ref 6–23)
CHLORIDE: 101 meq/L (ref 96–112)
CO2: 30 meq/L (ref 19–32)
Calcium: 10 mg/dL (ref 8.4–10.5)
Creatinine, Ser: 0.63 mg/dL (ref 0.40–1.20)
GFR: 98.78 mL/min (ref >60.00)
GLUCOSE: 107 mg/dL — AB (ref 70–99)
Potassium: 4.2 mEq/L (ref 3.5–5.1)
SODIUM: 139 meq/L (ref 135–145)

## 2017-03-12 NOTE — Progress Notes (Signed)
Subjective:  Patient ID: Katie Stout, female    DOB: 1946-04-27  Age: 71 y.o. MRN: [id number]  CC: Hypertension and Hyperlipidemia   HPI Katie Stout presents for f/up - Her blood pressures at home have been 139/71, 147/74, 135/77, and 136/69. However after arriving here her blood pressure was elevated at 162/92. Her at-home blood pressure machine calibrates accurately so this confirms that she has a whitecoat phenomenon but that her blood pressure is well-controlled at home. Nonetheless, she complains of worsening DOE over the last year. She is anticipating having surgery on her larynx and wants to be screened for heart disease.  Outpatient Medications Prior to Visit  Medication Sig Dispense Refill  . ALPRAZolam (XANAX) 0.25 MG tablet Take 1 tablet (0.25 mg total) by mouth at bedtime as needed for sleep. 30 tablet 2  . calcium carbonate (TUMS CALCIUM FOR LIFE BONE) 750 MG chewable tablet Chew 2-4 tablets by mouth daily.      . cholecalciferol (VITAMIN D) 1000 UNITS tablet Take 1,000 Units by mouth daily.      . fexofenadine (ALLEGRA) 30 MG tablet Take 30 mg by mouth 2 (two) times daily.    . fluticasone (FLONASE) 50 MCG/ACT nasal spray Place into both nostrils daily.    . hyoscyamine (LEVSIN SL) 0.125 MG SL tablet Place 1 tablet (0.125 mg total) under the tongue every 4 (four) hours as needed for cramping. 30 tablet 4  . rosuvastatin (CRESTOR) 10 MG tablet TAKE 1 TABLET BY MOUTH DAILY. 90 tablet 1  . spironolactone (ALDACTONE) 25 MG tablet Take 1 tablet (25 mg total) by mouth daily. 90 tablet 0  . predniSONE (DELTASONE) 5 MG tablet Take 5 mg by mouth daily with breakfast.     Facility-Administered Medications Prior to Visit  Medication Dose Route Frequency Provider Last Rate Last Dose  . botulinum toxin Type A (BOTOX) injection 200 Units  200 Units Intramuscular Once Tat, Rebecca S, DO        ROS Review of Systems  Constitutional: Negative.  Negative for appetite change,  diaphoresis and fatigue.  HENT: Positive for voice change. Negative for trouble swallowing.   Eyes: Negative.   Respiratory: Positive for shortness of breath (DOE). Negative for cough, chest tightness and wheezing.   Cardiovascular: Negative for chest pain, palpitations and leg swelling.  Gastrointestinal: Negative for abdominal pain, constipation, diarrhea, nausea and vomiting.  Endocrine: Negative.   Genitourinary: Negative.   Musculoskeletal: Negative.  Negative for back pain, myalgias and neck pain.  Skin: Negative for color change and rash.  Allergic/Immunologic: Negative.   Neurological: Negative.  Negative for dizziness, weakness and light-headedness.  Hematological: Negative for adenopathy. Does not bruise/bleed easily.  Psychiatric/Behavioral: Negative.     Objective:  BP (!) 147/74 (BP Location: Right Arm, Patient Position: Sitting, Cuff Size: Normal)   Pulse 95   Temp 98.1 F (36.7 C) (Oral)   Resp 16   Ht 5' 4.45" (1.637 m)   Wt 199 lb (90.3 kg)   SpO2 98%   BMI 33.68 kg/m   BP Readings from Last 3 Encounters:  03/12/17 (!) 147/74  03/04/17 (!) 162/103  01/09/17 (!) 154/80    Wt Readings from Last 3 Encounters:  03/12/17 199 lb (90.3 kg)  01/09/17 198 lb (89.8 kg)  12/18/16 196 lb (88.9 kg)    Physical Exam  Constitutional: She is oriented to person, place, and time. No distress.  HENT:  Mouth/Throat: Oropharynx is clear and moist. No oropharyngeal exudate.  Eyes:  Conjunctivae are normal. Right eye exhibits no discharge. Left eye exhibits no discharge. No scleral icterus.  Neck: Normal range of motion. Neck supple. No JVD present. No thyromegaly present.  Cardiovascular: Normal rate, regular rhythm and intact distal pulses.  Exam reveals no gallop and no friction rub.   No murmur heard. Pulmonary/Chest: Effort normal and breath sounds normal. No respiratory distress. She has no wheezes. She has no rales. She exhibits no tenderness.  Abdominal: Soft. Bowel  sounds are normal. She exhibits no distension and no mass. There is no tenderness. There is no rebound and no guarding.  Musculoskeletal: Normal range of motion. She exhibits no edema, tenderness or deformity.  Neurological: She is alert and oriented to person, place, and time.  Skin: Skin is warm and dry. No rash noted. She is not diaphoretic. No erythema. No pallor.  Vitals reviewed.   Lab Results  Component Value Date   WBC 5.3 03/12/2017   HGB 14.9 03/12/2017   HCT 44.6 03/12/2017   PLT 217.0 03/12/2017   GLUCOSE 107 (H) 03/12/2017   CHOL 152 03/12/2017   TRIG 105.0 03/12/2017   HDL 54.70 03/12/2017   LDLDIRECT 132.2 06/10/2012   LDLCALC 77 03/12/2017   ALT 32 12/18/2016   AST 28 12/18/2016   NA 139 03/12/2017   K 4.2 03/12/2017   CL 101 03/12/2017   CREATININE 0.63 03/12/2017   BUN 9 03/12/2017   CO2 30 03/12/2017   TSH 3.06 12/18/2016   HGBA1C 5.9 12/26/2006    Mr Shoulder Left Wo Contrast  Result Date: 01/23/2017 CLINICAL DATA:  Chronic right shoulder pain which has worsened over the past 3 months. No known injury. EXAM: MRI OF THE LEFT SHOULDER WITHOUT CONTRAST TECHNIQUE: Multiplanar, multisequence MR imaging of the shoulder was performed. No intravenous contrast was administered. COMPARISON:  None. FINDINGS: Rotator cuff: An interstitial tear in the posterior fibers of the infraspinatus measures 0.3 cm. Small interstitial tear is also seen in the subscapularis. No full-thickness tear or retracted tendon. Muscles:  Normal without atrophy or focal lesion. Biceps long head: Marked tendinopathy is worst in the intra-articular segment. No tear. Acromioclavicular Joint: Moderately severe degenerative change is present with marked marrow edema about the joint. Type 1 acromion. Fluid is seen in the subacromial/subdeltoid bursa. Glenohumeral Joint: Mild degenerative change is seen. Labrum:  Intact. Bones:  No fracture or worrisome lesion. Other: None. IMPRESSION: Severe rotator cuff  and long head of biceps tendinopathy. Small interstitial tears are seen in the infraspinatus and subscapularis but there is no full-thickness tear or retracted tendon. Moderately severe acromioclavicular osteoarthritis with marrow edema about the joint. Subacromial/subdeltoid bursitis. Electronically Signed   By: Inge Rise M.D.   On: 01/23/2017 13:18    Assessment & Plan:   Katie Stout was seen today for hypertension and hyperlipidemia.  Diagnoses and all orders for this visit:  Essential hypertension- her blood pressures well controlled. Electrolytes and renal function are normal. -     Basic metabolic panel; Future -     CBC with Differential/Platelet; Future  Hyperlipidemia with target LDL less than 130- she has achieved her LDL goal is doing well on the statin. -     Lipid panel; Future  Iron deficiency anemia secondary to inadequate dietary iron intake - her H&H remain normal. -     CBC with Differential/Platelet; Future  DOE (dyspnea on exertion)- her EKG was normal other than sinus tachycardia about 3 months ago. I tried to order a [place] today but her insurance  company would not allow it. I therefore asked her to see cardiology to be screened for cardiac causes of DOE and to evaluate her surgical risk. -     Ambulatory referral to Cardiology  Need for influenza vaccination -     Flu vaccine HIGH DOSE PF (Fluzone High dose)   I am having Ms. Blizard maintain her calcium carbonate, cholecalciferol, fexofenadine, ALPRAZolam, hyoscyamine, rosuvastatin, spironolactone, fluticasone, and predniSONE. We will continue to administer botulinum toxin Type A.  No orders of the defined types were placed in this encounter.    Follow-up: Return in about 6 months (around 09/09/2017).  [name], MD

## 2017-03-12 NOTE — Patient Instructions (Signed)

## 2017-03-13 ENCOUNTER — Encounter: Payer: Self-pay | Admitting: Internal Medicine

## 2017-03-20 ENCOUNTER — Ambulatory Visit: Payer: Medicare Other | Admitting: Physical Medicine & Rehabilitation

## 2017-03-26 ENCOUNTER — Other Ambulatory Visit: Payer: Self-pay | Admitting: Internal Medicine

## 2017-03-26 DIAGNOSIS — I10 Essential (primary) hypertension: Secondary | ICD-10-CM

## 2017-03-26 DIAGNOSIS — E876 Hypokalemia: Secondary | ICD-10-CM

## 2017-03-26 NOTE — Progress Notes (Addendum)
Katie Stout was seen today in neurologic consultation at the request of Katie Lima, MD.  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  The records that were made available to me were reviewed.  She has been seen by Dr. Gilford [name] at [facility] for years and has been getting botox for 17 years.  It appears that she was getting dysport but has been getting onobotulinum toxin type A as of late.  Her last injections were on 10/05/15.  States that the last injections were done differently because she was having some pain and after the injections she had pain down the right shoulder and neck.  She is having a lot of neck pain that starts in the right occiput and may radiate to the right shoulder but almost always goes to the right head and causes a headache.  It causes a shooting pain in the head but a "dull shooting pain."  It will hurt in the occiput to touch.  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  Also c/o significant R shoulder pain and cannot put her right arm behind her back without pain (do bra, etc).  Neuroimaging has previously been performed. 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  MRI brain done same time which was reported to show some T2 hyperintensities.  05/28/16 update:  Patient returns for follow-up regarding her cervical dystonia.  She has long been receiving Botox, but had her first injections with me on 03/29/2016.  She had 180 units.  Patient denies any swallowing difficulties.  She denies any head heaviness.  She reports that her head is much straighter but she is having pain down the right neck.   Aleve helps but she doesn't like to take it.   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  I thought some of her pain was related to  occipital neuralgia and told her that I was willing to do an occipital nerve block, but she decided to hold on that.  She also had an MRI of the shoulder since our last visit, which demonstrated Focal full-thickness tear of the superior aspect of the subscapularis tendon with subluxation of the severely degenerated long head of the biceps tendon into the tear.  She also had a musculotendinous tear of the infraspinatus.  Was referred to Raliegh Ip and she reports that she was sent to PT and she feels that she got stronger through PT.  PT also worked on her shoulder but she only had short term benefit with it so the PT released her from that.  09/18/16 update:  Patient returns today for follow-up.  Her last Botox injections were on 08/09/2016.  240 units were injected.  She had swallowing difficulties last time.  She noted swallowing trouble with meats, like chicken.  This has gotten better.  She was very careful. She denies any feeling of heaviness to the head or neck.  In regards to her neck pain, she states that she takes aleve 3-4 days a week.  It does help.  The pain was better with botox.  No head tremor  12/17/16 update:  Patient seen today in follow-up.  Last Botox injections were in 11/08/2016.  We just slightly reduced the injection  dose to see if we can avoid swallowing difficulties.  She states that this is the best that she has ever done.  She still has some pain but thinks that is from her neck.  She reports that she is urinating "oodles of blood."  That started this morning.  She has had nephrolithiasis in the past but it has been years.  03/27/17 update:  Patient seen in follow-up for her cervical dystonia.  Last Botox injections were 02/14/2017.  She had some swallowing issues again but not as bad as the first time (had none the 2nd time).  The amount in the levator had not changed.  I reviewed otolaryngology notes from 02/24/2017 [facility].  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  They  are taking conservative approaches.  She is seeing Dr. Letta Pate.  She states that she had to r/s the appt for acromioclavicular joint vs subacromial bursa injection.    PREVIOUS MEDICATIONS:   ALLERGIES:   Allergies  Allergen Reactions  . Penicillins Hives  . Penicillin G     Other reaction(s): Other (See Comments) hives  . Adhesive [Tape] Other (See Comments)    SKIN REDNESS AND IRRITATION  . Bactrim [Sulfamethoxazole-Trimethoprim] Rash  . Codeine Nausea Only    CURRENT MEDICATIONS:  Outpatient Encounter Prescriptions as of 03/27/2017  Medication Sig  . ALPRAZolam (XANAX) 0.25 MG tablet Take 1 tablet (0.25 mg total) by mouth at bedtime as needed for sleep.  . calcium carbonate (TUMS CALCIUM FOR LIFE BONE) 750 MG chewable tablet Chew 2-4 tablets by mouth daily.    . cholecalciferol (VITAMIN D) 1000 UNITS tablet Take 1,000 Units by mouth daily.    . fexofenadine (ALLEGRA) 30 MG tablet Take 30 mg by mouth 2 (two) times daily.  . fluticasone (FLONASE) 50 MCG/ACT nasal spray Place into both nostrils daily.  . hyoscyamine (LEVSIN SL) 0.125 MG SL tablet Place 1 tablet (0.125 mg total) under the tongue every 4 (four) hours as needed for cramping.  . rosuvastatin (CRESTOR) 10 MG tablet TAKE 1 TABLET BY MOUTH DAILY.  Katie Stout spironolactone (ALDACTONE) 25 MG tablet TAKE 1 TABLET BY MOUTH EVERY DAY  . [DISCONTINUED] predniSONE (DELTASONE) 5 MG tablet Take 5 mg by mouth daily with breakfast.  . [DISCONTINUED] spironolactone (ALDACTONE) 25 MG tablet Take 1 tablet (25 mg total) by mouth daily.   Facility-Administered Encounter Medications as of 03/27/2017  Medication  . botulinum toxin Type A (BOTOX) injection 200 Units    PAST MEDICAL HISTORY:   Past Medical History:  Diagnosis Date  . Anxiety   . Complication of anesthesia    states had flashing lights x 6 wks. post-op in 1992  . Dental crowns present   . Difficulty swallowing solids    states difficulty swallowing dry foods  .  Diverticulosis of colon (without mention of hemorrhage)   . Eczema   . GERD (gastroesophageal reflux disease)   . H. pylori infection   . Hiatal hernia    states "small"  . History of kidney stones   . Hypertension    under control, has been on med. x 7 yrs.  . 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 Other and unspecified hyperlipidemia   . Palpitations    "all my life" - states no problems; states had stress test 2006; no cardiologist  . Post-nasal drip 04/10/2012  . Rash    breasts  . Spasmodic torticollis    has difficulty turning head to left side; receives Botox injections  PAST SURGICAL HISTORY:   Past Surgical History:  Procedure Laterality Date  . ABDOMINAL HYSTERECTOMY  1993   complete  . CATARACT EXTRACTION, BILATERAL  2007  . CESAREAN SECTION    . COLONOSCOPY  02/10/2013  . ESOPHAGOGASTRODUODENOSCOPY  02/10/2013  . ESOPHAGOSCOPY  04/14/2012   Procedure: ESOPHAGOSCOPY;  Surgeon: Rozetta Nunnery, MD;  Location: [facility];  Service: ENT;  Laterality: Right;  . LARYNX SURGERY  11/2011   vocal cord biopsy  . NASAL HEMORRHAGE CONTROL  04/14/2012   Procedure: EPISTAXIS CONTROL;  Surgeon: Rozetta Nunnery, MD;  Location: [place];  Service: ENT;  Laterality: Right;  Cauterization of Right Septal Vessel  . OOPHORECTOMY  1992   x 1  . ORIF ULNAR FRACTURE  01/16/2000   left: also radial head dislocation  . 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 Marital status: Widowed    Spouse name: N/A  . Number of children: 1  . Years of education: N/A   Occupational History  . retired    Social History Main Topics  . Smoking status: Never Smoker  . Smokeless tobacco: Never Used  . Alcohol use No  . Drug use: No  . Sexual activity: Not on file   Other Topics Concern  . Not on file   Social History Narrative   HSG   Married '68-22  years; Divorced   1 Daughter - '85; no grandchildren   Work: PT for [employer]   Lives: Alone, but adjoins daughter    FAMILY HISTORY:   Family Status  Relation Status  . Mother Deceased at age 23       CA, breast  . Father Deceased at age 58       unknown cause of death; pneumonia  . Daughter Alive       [phone]  . Brother (Not Specified)    ROS:  A complete 10 system review of systems was obtained and was unremarkable apart from what is mentioned above.  PHYSICAL EXAMINATION:    VITALS:   Vitals:   03/27/17 1316  BP: 130/70  Pulse: 100  SpO2: 93%  Weight: 200 lb (90.7 kg)  Height: 5' 4.5" (1.638 m)    GEN:  Normal appears female in no acute distress.  Appears stated age. HEENT:  Normocephalic, atraumatic. The mucous membranes are moist. The superficial temporal arteries are without ropiness or tenderness.  No head tilt Cardiovascular: Tachycardic, but regular rhythm  Lungs: Clear to auscultation bilaterally. Neck/Heme: There are no carotid bruits noted bilaterally.  There is still Hypertrophy of the left sternocleidomastoid. Neck/head is turned to the right.    NEUROLOGICAL: Orientation:  The patient is alert and oriented x 3.   Cranial nerves: There is good facial symmetry. The pupils are equal round and reactive to light bilaterally. Fundoscopic exam reveals clear disc margins bilaterally. Extraocular muscles are intact and visual fields are full to confrontational testing. Speech is fluent and clear but she has vocal irritation. Soft palate rises symmetrically and there is no tongue deviation. Hearing is intact to conversational tone. Tone: Tone is good throughout. Sensation: Sensation is intact to light touch throughout Coordination:  The patient has no difficulty with RAM's or FNF bilaterally. Motor: Strength is 5/5 in the bilateral upper and lower extremities.  She has difficulty abducting the right shoulder.  Shoulder shrug is equal and symmetric. There  is  no pronator drift.  There are no fasciculations noted. Gait and Station: The patient is able to ambulate without difficulty.    IMPRESSION/PLAN  1.  Cervical dystonia  -This is very long-standing.  She has been doing botox for a long time but first botox with me was on 03/29/16 and most recent was on 02/14/2017.  She has had on and off dysphagia and we discussed whether or not just to stop doing the levator scapulae and ultimately decided to d/c that muscle.  We talked about doing physical therapy for the neck and she wants to wait until next botox injections and will order then  2.  Neck pain with occipital neuralgia and cervical degenerative changes and facet arthritis  -now seeing Dr. Letta Pate  3.  HTN  -better today  4.  Follow up at next injection appointment

## 2017-03-27 ENCOUNTER — Encounter: Payer: Self-pay | Admitting: Neurology

## 2017-03-27 ENCOUNTER — Ambulatory Visit (INDEPENDENT_AMBULATORY_CARE_PROVIDER_SITE_OTHER): Payer: Medicare Other | Admitting: Neurology

## 2017-03-27 VITALS — BP 130/70 | HR 100 | Ht 64.5 in | Wt 200.0 lb

## 2017-03-27 DIAGNOSIS — G243 Spasmodic torticollis: Secondary | ICD-10-CM

## 2017-03-31 DIAGNOSIS — M7582 Other shoulder lesions, left shoulder: Secondary | ICD-10-CM | POA: Diagnosis not present

## 2017-04-07 ENCOUNTER — Encounter: Payer: Self-pay | Admitting: Physical Medicine & Rehabilitation

## 2017-04-07 ENCOUNTER — Encounter: Payer: Medicare Other | Attending: Physical Medicine & Rehabilitation

## 2017-04-07 ENCOUNTER — Ambulatory Visit (HOSPITAL_BASED_OUTPATIENT_CLINIC_OR_DEPARTMENT_OTHER): Payer: Medicare Other | Admitting: Physical Medicine & Rehabilitation

## 2017-04-07 VITALS — BP 167/84 | HR 97

## 2017-04-07 DIAGNOSIS — M7542 Impingement syndrome of left shoulder: Secondary | ICD-10-CM | POA: Insufficient documentation

## 2017-04-07 NOTE — Patient Instructions (Addendum)
We did a Left subacromial injection with celestone and lidocaine  Please call if you'd like to schedule the neck injection  If this shoulder injection is not very helpful. There is one other type of shoulder injection call the acromioclavicular joint that may be

## 2017-04-07 NOTE — Progress Notes (Signed)
Subdeltoid bursa Left  with ultrasound guidance  Indication:Left Shoulder pain and impingement not relieved by medication management and other conservative care.  Informed consent was obtained after describing risks and benefits of the procedure with the patient, this includes bleeding, bruising, infection and medication side effects. The patient wishes to proceed and has given written consent. Patient was placed in a seated position. The Left shoulder was marked and prepped with betadine in the subacromial area. A 25-gauge 1-1/2 inch needle was inserted into the subacromial area, 1% lidocaine times 2 mL was infiltrated. Then, a 22-gauge 80 mm Echo block needle was inserted into the subdeltoid bursa After negative draw back for blood, a solution containing 1 mL of 6 mg per ML betamethasone and 4 mL of 1% lidocaine was injected. A band aid was applied. The patient tolerated the procedure well. Post procedure instructions were given.

## 2017-04-09 ENCOUNTER — Telehealth: Payer: Self-pay | Admitting: *Deleted

## 2017-04-09 NOTE — Telephone Encounter (Signed)
Katie Stout called and says she is not in pain and was able to exercise this morning and is very excited.  She was uncertain if it was the if it might be the prior injection given to her to get her to the appt for her shoulder or if it was the shoulder injection but something worked!

## 2017-04-09 NOTE — Telephone Encounter (Signed)
Called patient back and let her know.

## 2017-04-09 NOTE — Telephone Encounter (Signed)
Last injection was in subdeltoid bursa under ultrasound which was likely the pain generator

## 2017-04-18 ENCOUNTER — Encounter: Payer: Self-pay | Admitting: Cardiology

## 2017-04-18 ENCOUNTER — Ambulatory Visit (INDEPENDENT_AMBULATORY_CARE_PROVIDER_SITE_OTHER): Payer: Medicare Other | Admitting: Cardiology

## 2017-04-18 VITALS — BP 144/86 | HR 101 | Ht 64.5 in | Wt 193.4 lb

## 2017-04-18 DIAGNOSIS — R0602 Shortness of breath: Secondary | ICD-10-CM

## 2017-04-18 DIAGNOSIS — I10 Essential (primary) hypertension: Secondary | ICD-10-CM

## 2017-04-18 DIAGNOSIS — R011 Cardiac murmur, unspecified: Secondary | ICD-10-CM

## 2017-04-18 NOTE — Progress Notes (Signed)
Cardiology Office Note    Date:  04/18/2017   ID:  Katie Stout, DOB Jan 27, 1946, MRN [id number]  PCP:  [name], MD  Cardiologist:  Fransico Him, MD   Chief Complaint  Patient presents with  . Shortness of Breath  . Hypertension    History of Present Illness:  Katie Stout is a 71 y.o. female who is being seen today for the evaluation of SOB at the request of [name], MD.  This is a 71yo WF with a history of GERD and HTN who is referred by her PCP for evaluation of SOB.  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  She was recently seen by them and says that they feel the mass is not causing her SOB.  She says that over the past few months she has had problems with DOE and SOB when she is sitting back in her recliner.  She will get SOB just walking to the mailbox although she thinks it is due to weight gain and sedentary state.  She states that she also like to recline back in her chair and when she falls asleep she will wake up very SOB.  This does not happen when she sleeps at night but she sleeps on her side.  She denies any chest pain or pressure, LE edema, dizziness, PND or orthopnea.  She also has problems with torticollis and is being treated with steroid injections.      Past Medical History:  Diagnosis Date  . Anxiety   . Complication of anesthesia    states had flashing lights x 6 wks. post-op in 1992  . Dental crowns present   . Difficulty swallowing solids    states difficulty swallowing dry foods  . Diverticulosis of colon (without mention of hemorrhage)   . Eczema   . GERD (gastroesophageal reflux disease)   . H. pylori infection   . Hiatal hernia    states "small"  . History of kidney stones   . Hypertension    under control, has been on med. x 7 yrs.  . 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 Other and unspecified hyperlipidemia   . Palpitations    "all my life" - states no problems; states had  stress test 2006; no cardiologist  . Post-nasal drip 04/10/2012  . Rash    breasts  . Spasmodic torticollis    has difficulty turning head to left side; receives Botox injections    Past Surgical History:  Procedure Laterality Date  . ABDOMINAL HYSTERECTOMY  1993   complete  . CATARACT EXTRACTION, BILATERAL  2007  . CESAREAN SECTION    . COLONOSCOPY  02/10/2013  . ESOPHAGOGASTRODUODENOSCOPY  02/10/2013  . ESOPHAGOSCOPY  04/14/2012   Procedure: ESOPHAGOSCOPY;  Surgeon: Rozetta Nunnery, MD;  Location: [facility];  Service: ENT;  Laterality: Right;  . LARYNX SURGERY  11/2011   vocal cord biopsy  . NASAL HEMORRHAGE CONTROL  04/14/2012   Procedure: EPISTAXIS CONTROL;  Surgeon: Rozetta Nunnery, MD;  Location: [facility];  Service: ENT;  Laterality: Right;  Cauterization of Right Septal Vessel  . OOPHORECTOMY  1992   x 1  . ORIF ULNAR FRACTURE  01/16/2000   left: also radial head dislocation  . TRANSPHENOIDAL / TRANSNASAL HYPOPHYSECTOMY / RESECTION PITUITARY TUMOR  1983    Current Medications: Current Meds  Medication Sig  . ALPRAZolam (XANAX) 0.25 MG  tablet Take 1 tablet (0.25 mg total) by mouth at bedtime as needed for sleep.  [name] azelastine (OPTIVAR) 0.05 % ophthalmic solution Place 1 drop into both eyes as needed.  . calcium carbonate (TUMS CALCIUM FOR LIFE BONE) 750 MG chewable tablet Chew 2-4 tablets by mouth daily.    . cholecalciferol (VITAMIN D) 1000 UNITS tablet Take 1,000 Units by mouth daily.    . fexofenadine (ALLEGRA) 30 MG tablet Take 30 mg by mouth 2 (two) times daily.  . fluticasone (FLONASE) 50 MCG/ACT nasal spray Place into both nostrils daily.  . hyoscyamine (LEVSIN SL) 0.125 MG SL tablet Place 1 tablet (0.125 mg total) under the tongue every 4 (four) hours as needed for cramping.  . rosuvastatin (CRESTOR) 10 MG tablet TAKE 1 TABLET BY MOUTH DAILY.  [name] spironolactone (ALDACTONE) 25 MG tablet TAKE 1 TABLET BY MOUTH EVERY DAY   Current  Facility-Administered Medications for the 04/18/17 encounter (Office Visit) with Sueanne Margarita, MD  Medication  . botulinum toxin Type A (BOTOX) injection 200 Units    Allergies:   Penicillins; Penicillin g; Adhesive [tape]; Bactrim [sulfamethoxazole-trimethoprim]; and Codeine   Social History   Social History  . Marital status: Widowed    Spouse name: N/A  . Number of children: 1  . Years of education: N/A   Occupational History  . retired    Social History Main Topics  . Smoking status: Never Smoker  . Smokeless tobacco: Never Used  . Alcohol use No  . Drug use: No  . Sexual activity: Not Asked   Other Topics Concern  . None   Social History Narrative   HSG   Married '68-22 years; Divorced   1 Daughter - '85; no grandchildren   Work: PT for [employer]   Lives: Alone, but adjoins daughter     Family History:  The patient's family history includes AAA (abdominal aortic aneurysm) in her brother; Breast cancer in her mother; Colon cancer in her mother; Coronary artery disease in her mother; Heart attack in her mother; Hypertension in her father; Other in her father; Prostate cancer in her brother; Thrombocytopenia in her father.   ROS:   Please see the history of present illness.    ROS All other systems reviewed and are negative.  No flowsheet data found.     PHYSICAL EXAM:   VS:  BP (!) 144/86   Pulse (!) 101   Ht 5' 4.5" (1.638 m)   Wt 193 lb 6.4 oz (87.7 kg)   SpO2 94%   BMI 32.68 kg/m    GEN: Well nourished, well developed, in no acute distress  HEENT: normal  Neck: no JVD, carotid bruits, or masses Cardiac: RRR; no murmurs, rubs, or gallops,no edema.  Intact distal pulses bilaterally.  Respiratory:  clear to auscultation bilaterally, normal work of breathing GI: soft, nontender, nondistended, + BS MS: no deformity or atrophy  Skin: warm and dry, no rash Neuro:  Alert and Oriented x 3, Strength and sensation are intact Psych: euthymic  mood, full affect  Wt Readings from Last 3 Encounters:  04/18/17 193 lb 6.4 oz (87.7 kg)  03/27/17 200 lb (90.7 kg)  03/12/17 199 lb (90.3 kg)      Studies/Labs Reviewed:   EKG:  EKG is not ordered today.   Recent Labs: 12/18/2016: ALT 32; Pro B Natriuretic peptide (BNP) 22.0; TSH 3.06 01/09/2017: Magnesium 2.2 03/12/2017: BUN 9; Creatinine, Ser 0.63; Hemoglobin 14.9; Platelets 217.0; Potassium 4.2; Sodium 139   Lipid  Panel    Component Value Date/Time   CHOL 152 03/12/2017 1350   TRIG 105.0 03/12/2017 1350   HDL 54.70 03/12/2017 1350   CHOLHDL 3 03/12/2017 1350   VLDL 21.0 03/12/2017 1350   LDLCALC 77 03/12/2017 1350   LDLDIRECT 132.2 06/10/2012 1011    Additional studies/ records that were reviewed today include: Office notes from PCP    ASSESSMENT:    1. Shortness of breath   2. Systolic murmur   3. Essential hypertension      PLAN:  In order of problems listed above:  1. SOB - unclear etiology but I suspect that it is related to sedentary state and obesity.  Of concern, though , is that she has a amyloid nodule on her vocal cord so we need to consider amyloidosis of the heart casing restrictive CM but she does not having findings of CHF on exam today.  I will get a 2D echo to assess LVF as well as Lexiscan myoview to rule out ischemia.  She also has periods when she is sitting in her recliner where she will fall asleep and awaken very SOB so she could also have OSA.  I will wait for the other studies to come back and if unremarkable then consider sleep study.    2.  Heart murmur - I will get a 2D echo to assess further.    3.  HTN - BP is well controlled on exam today.  She will continue on aldactone 25mg  daily.      Medication Adjustments/Labs and Tests Ordered: Current medicines are reviewed at length with the patient today.  Concerns regarding medicines are outlined above.  Medication changes, Labs and Tests ordered today are listed in the Patient Instructions  below.  Patient Instructions  Medication Instructions:  The current medical regimen is effective;  continue present plan and medications.  Testing/Procedures: Your physician has requested that you have an echocardiogram. Echocardiography is a painless test that uses sound waves to create images of your heart. It provides your doctor with information about the size and shape of your heart and how well your heart's chambers and valves are working. This procedure takes approximately one hour. There are no restrictions for this procedure.  Your physician has requested that you have a lexiscan myoview. For further information please visit [email or web address]. Please follow instruction sheet, as given.  Follow-Up: Follow up as needed with Dr Radford Pax after the above testing.  If you need a refill on your cardiac medications before your next appointment, please call your pharmacy.  Thank you for choosing [facility]!!        Signed, Fransico Him, MD  04/18/2017 10:06 AM    [address], [place], [place]  [address] Phone: [phone]; Fax: [phone]

## 2017-04-18 NOTE — Patient Instructions (Signed)
Medication Instructions:  The current medical regimen is effective;  continue present plan and medications.  Testing/Procedures: Your physician has requested that you have an echocardiogram. Echocardiography is a painless test that uses sound waves to create images of your heart. It provides your doctor with information about the size and shape of your heart and how well your heart's chambers and valves are working. This procedure takes approximately one hour. There are no restrictions for this procedure.  Your physician has requested that you have a lexiscan myoview. For further information please visit [email or web address]. Please follow instruction sheet, as given.  Follow-Up: Follow up as needed with Dr Radford Pax after the above testing.  If you need a refill on your cardiac medications before your next appointment, please call your pharmacy.  Thank you for choosing [facility]!!

## 2017-04-28 ENCOUNTER — Telehealth (HOSPITAL_COMMUNITY): Payer: Self-pay | Admitting: *Deleted

## 2017-04-28 NOTE — Telephone Encounter (Signed)
Patient given detailed instructions per Myocardial Perfusion Study Information Sheet for the test on 05/01/17 at 0715. Patient notified to arrive 15 minutes early and that it is imperative to arrive on time for appointment to keep from having the test rescheduled.  If you need to cancel or reschedule your appointment, please call the office within 24 hours of your appointment. . Patient verbalized understanding.Neeley Sedivy, Ranae Palms

## 2017-04-30 DIAGNOSIS — J385 Laryngeal spasm: Secondary | ICD-10-CM | POA: Diagnosis not present

## 2017-04-30 DIAGNOSIS — R490 Dysphonia: Secondary | ICD-10-CM | POA: Diagnosis not present

## 2017-04-30 DIAGNOSIS — E859 Amyloidosis, unspecified: Secondary | ICD-10-CM | POA: Diagnosis not present

## 2017-05-01 ENCOUNTER — Ambulatory Visit (HOSPITAL_COMMUNITY): Payer: Medicare Other | Attending: Cardiovascular Disease

## 2017-05-01 ENCOUNTER — Other Ambulatory Visit: Payer: Self-pay

## 2017-05-01 ENCOUNTER — Ambulatory Visit (HOSPITAL_BASED_OUTPATIENT_CLINIC_OR_DEPARTMENT_OTHER): Payer: Medicare Other

## 2017-05-01 DIAGNOSIS — I10 Essential (primary) hypertension: Secondary | ICD-10-CM | POA: Insufficient documentation

## 2017-05-01 DIAGNOSIS — I351 Nonrheumatic aortic (valve) insufficiency: Secondary | ICD-10-CM | POA: Diagnosis not present

## 2017-05-01 DIAGNOSIS — K219 Gastro-esophageal reflux disease without esophagitis: Secondary | ICD-10-CM | POA: Diagnosis not present

## 2017-05-01 DIAGNOSIS — R0602 Shortness of breath: Secondary | ICD-10-CM

## 2017-05-01 DIAGNOSIS — R9439 Abnormal result of other cardiovascular function study: Secondary | ICD-10-CM | POA: Insufficient documentation

## 2017-05-01 LAB — MYOCARDIAL PERFUSION IMAGING
CHL CUP NUCLEAR SDS: 2
LHR: 0.31
LV dias vol: 61 mL (ref 46–106)
LV sys vol: 18 mL
Peak HR: 120 {beats}/min
Rest HR: 90 {beats}/min
SRS: 14
SSS: 16
TID: 0.98

## 2017-05-01 MED ORDER — TECHNETIUM TC 99M TETROFOSMIN IV KIT
10.2000 | PACK | Freq: Once | INTRAVENOUS | Status: AC | PRN
  Administered 2017-05-01: 10.2 via INTRAVENOUS
  Filled 2017-05-01: qty 11

## 2017-05-01 MED ORDER — TECHNETIUM TC 99M TETROFOSMIN IV KIT
32.0000 | PACK | Freq: Once | INTRAVENOUS | Status: AC | PRN
  Administered 2017-05-01: 32 via INTRAVENOUS
  Filled 2017-05-01: qty 32

## 2017-05-01 MED ORDER — REGADENOSON 0.4 MG/5ML IV SOLN
0.4000 mg | Freq: Once | INTRAVENOUS | Status: AC
  Administered 2017-05-01: 0.4 mg via INTRAVENOUS

## 2017-05-02 ENCOUNTER — Telehealth: Payer: Self-pay

## 2017-05-02 ENCOUNTER — Telehealth: Payer: Self-pay | Admitting: Cardiology

## 2017-05-02 DIAGNOSIS — I517 Cardiomegaly: Secondary | ICD-10-CM

## 2017-05-02 NOTE — Telephone Encounter (Signed)
Status:  Final result Visible to patient:  Yes (MyChart) Dx:  Shortness of breath  Notes recorded by Teressa Senter, RN on 05/02/2017 at 11:27 AM EDT Patient notified of ECHO results. Pt made aware that we will call her back to schedule an appointment for a cardiac MRI. Patient verbalized understanding and thanked me for the call. ------  Notes recorded by Sueanne Margarita, MD on 05/01/2017 at 2:04 PM EDT She does have HTN which could explain her LVH but since it is moderate with diastolic dysfunction please get a cardiac MRI to rule out restrictive CM ------  Notes recorded by Sueanne Margarita, MD on 05/01/2017 at 2:03 PM EDT Echo showed moderate LVH with normal LVF and mild AR   MRI order placed. Message sent to [facility] to schedule patient

## 2017-05-02 NOTE — Telephone Encounter (Signed)
Called patient regarding her cardiac MRI.  She would like it scheduled after 11 a.m., but, cannot do it on 05-07-17.  Message to [place].

## 2017-05-07 DIAGNOSIS — E859 Amyloidosis, unspecified: Secondary | ICD-10-CM | POA: Diagnosis not present

## 2017-05-07 DIAGNOSIS — R490 Dysphonia: Secondary | ICD-10-CM | POA: Diagnosis not present

## 2017-05-07 DIAGNOSIS — J385 Laryngeal spasm: Secondary | ICD-10-CM | POA: Diagnosis not present

## 2017-05-07 DIAGNOSIS — E854 Organ-limited amyloidosis: Secondary | ICD-10-CM | POA: Diagnosis not present

## 2017-05-07 DIAGNOSIS — J384 Edema of larynx: Secondary | ICD-10-CM | POA: Diagnosis not present

## 2017-05-12 ENCOUNTER — Encounter: Payer: Self-pay | Admitting: Cardiology

## 2017-05-16 ENCOUNTER — Ambulatory Visit (INDEPENDENT_AMBULATORY_CARE_PROVIDER_SITE_OTHER): Payer: Medicare Other | Admitting: Neurology

## 2017-05-16 DIAGNOSIS — G243 Spasmodic torticollis: Secondary | ICD-10-CM | POA: Diagnosis not present

## 2017-05-16 MED ORDER — ONABOTULINUMTOXINA 100 UNITS IJ SOLR
280.0000 [IU] | Freq: Once | INTRAMUSCULAR | Status: AC
  Administered 2017-05-16: 280 [IU] via INTRAMUSCULAR

## 2017-05-16 NOTE — Procedures (Signed)
[facility]   Procedure Note Botox  Attending: Dr. Wells Guiles Deepti Gunawan  Preoperative Diagnosis(es): Cervical Dystonia  Result History  Onset of effect: unknown Adverse Effects: dysphagia; decided to hold on levator scapulae.  Also asked me to hold on trap injections as having pain down her arms that wears off after the 3 months  Consent obtained from: The patient Benefits discussed included, but were not limited to decreased muscle tightness, increased joint range of motion, and decreased pain.  Risk discussed included, but were not limited pain and discomfort, bleeding, bruising, excessive weakness, venous thrombosis, muscle atrophy and dysphagia.  A copy of the patient medication guide was given to the patient which explains the blackbox warning.  Patients identity and treatment sites confirmed Yes.  .  Details of Procedure: Skin was cleaned with alcohol.  A 30 gauge, 1/2 inch needle was introduced to the target muscle (except splenius capitus, posterior approach, where 27 inch, 1 1/2 gauge needle was used).  Prior to injection, the needle plunger was aspirated to make sure the needle was not within a blood vessel.  There was no blood retrieved on aspiration.    Following is a summary of the muscles injected  And the amount of Botulinum toxin used:   Dilution 0.9% preservative free saline mixed with 100 u Botox type A to make 10 U per 0.1cc  Injections  Location Left  Right Units Number of sites        Sternocleidomastoid 50/20  70 2  Splenius Capitus, posterior approach  80 80 1  Splenius Capitus, lateral approach  40 40 1  Levator Scapulae      Trapezius            TOTAL UNITS:   190    Agent: Botulinum Type A ( Onobotulinum Toxin type A ).  2 vials of Botox were used, each containing 100 units and freshly diluted with 1 mL of sterile, non-preserved saline   Total injected (Units): 190  Total wasted (Units): 0   Pt tolerated procedure well without complications.    Reinjection is anticipated in 3 months.

## 2017-05-20 ENCOUNTER — Ambulatory Visit: Payer: Federal, State, Local not specified - PPO | Admitting: Physical Medicine & Rehabilitation

## 2017-05-21 ENCOUNTER — Ambulatory Visit (HOSPITAL_COMMUNITY)
Admission: RE | Admit: 2017-05-21 | Discharge: 2017-05-21 | Disposition: A | Discharge status: Home / Self Care | Payer: Medicare Other | Source: Ambulatory Visit | Attending: Cardiology | Admitting: Cardiology

## 2017-05-21 DIAGNOSIS — I082 Rheumatic disorders of both aortic and tricuspid valves: Secondary | ICD-10-CM | POA: Diagnosis not present

## 2017-05-21 DIAGNOSIS — I517 Cardiomegaly: Secondary | ICD-10-CM

## 2017-05-21 DIAGNOSIS — R0602 Shortness of breath: Secondary | ICD-10-CM | POA: Diagnosis not present

## 2017-05-21 LAB — POCT I-STAT CREATININE: CREATININE: 0.7 mg/dL (ref 0.44–1.00)

## 2017-05-21 MED ORDER — GADOBENATE DIMEGLUMINE 529 MG/ML IV SOLN
30.0000 mL | Freq: Once | INTRAVENOUS | Status: AC | PRN
  Administered 2017-05-21: 30 mL via INTRAVENOUS

## 2017-05-27 ENCOUNTER — Other Ambulatory Visit: Payer: Self-pay | Admitting: Emergency Medicine

## 2017-05-27 ENCOUNTER — Other Ambulatory Visit: Payer: Self-pay | Admitting: Internal Medicine

## 2017-05-27 MED ORDER — ROSUVASTATIN CALCIUM 10 MG PO TABS: 10.0000 mg | ORAL_TABLET | Freq: Every day | ORAL | 0 refills | Status: DC

## 2017-06-22 ENCOUNTER — Other Ambulatory Visit: Payer: Self-pay | Admitting: Internal Medicine

## 2017-06-22 DIAGNOSIS — I10 Essential (primary) hypertension: Secondary | ICD-10-CM

## 2017-06-22 DIAGNOSIS — E876 Hypokalemia: Secondary | ICD-10-CM

## 2017-07-02 ENCOUNTER — Ambulatory Visit (INDEPENDENT_AMBULATORY_CARE_PROVIDER_SITE_OTHER): Payer: Medicare Other | Admitting: Family

## 2017-07-02 ENCOUNTER — Encounter: Payer: Self-pay | Admitting: Family

## 2017-07-02 ENCOUNTER — Ambulatory Visit (INDEPENDENT_AMBULATORY_CARE_PROVIDER_SITE_OTHER)
Admission: RE | Admit: 2017-07-02 | Discharge: 2017-07-02 | Disposition: A | Discharge status: Home / Self Care | Payer: Medicare Other | Source: Ambulatory Visit | Attending: Family | Admitting: Family

## 2017-07-02 VITALS — BP 154/82 | HR 102 | Temp 98.2°F | Ht 64.0 in | Wt 196.0 lb

## 2017-07-02 DIAGNOSIS — J209 Acute bronchitis, unspecified: Secondary | ICD-10-CM | POA: Diagnosis not present

## 2017-07-02 DIAGNOSIS — R0602 Shortness of breath: Secondary | ICD-10-CM | POA: Diagnosis not present

## 2017-07-02 DIAGNOSIS — R05 Cough: Secondary | ICD-10-CM | POA: Diagnosis not present

## 2017-07-02 DIAGNOSIS — R059 Cough, unspecified: Secondary | ICD-10-CM

## 2017-07-02 MED ORDER — DOXYCYCLINE HYCLATE 100 MG PO TABS
100.0000 mg | ORAL_TABLET | Freq: Two times a day (BID) | ORAL | 0 refills | Status: DC
Start: 2017-07-02 — End: 2017-09-29

## 2017-07-02 MED ORDER — ALBUTEROL SULFATE (2.5 MG/3ML) 0.083% IN NEBU
2.5000 mg | INHALATION_SOLUTION | Freq: Once | RESPIRATORY_TRACT | Status: AC
  Administered 2017-07-02: 2.5 mg via RESPIRATORY_TRACT

## 2017-07-02 MED ORDER — BENZONATATE 100 MG PO CAPS: 200.0000 mg | ORAL_CAPSULE | Freq: Three times a day (TID) | ORAL | 0 refills | Status: DC | PRN

## 2017-07-02 NOTE — Patient Instructions (Addendum)
Use OTC Mucinex to help as an expectorant; will give the Tessalon Perles to help get you some cough relief. Please start the antibiotics and take them 2 x per day x 10 days; will be in touch with your CXR results.

## 2017-07-02 NOTE — Progress Notes (Signed)
Katie Stout is a 71 y.o. female with the following history as recorded in EpicCare:  Patient Active Problem List   Diagnosis Date Noted  . Chronic neck pain 03/04/2017  . Myofascial muscle pain 03/04/2017  . Subacromial impingement of left shoulder 03/04/2017  . Arthralgia of left acromioclavicular joint 03/04/2017  . Hypokalemia 12/19/2016  . Systolic murmur 01/60/1093  . DOE (dyspnea on exertion) 12/18/2016  . Degenerative disc disease, cervical 01/10/2016  . Insomnia 10/27/2014  . Iron deficiency anemia 06/27/2011  . Routine general medical examination at a health care facility 01/02/2011  . 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 Spastic torticollis 05/24/2010  . Allergic rhinitis 12/29/2009  . Osteoporosis 03/28/2008  . Hyperlipidemia with target LDL less than 130 01/24/2007  . Depression 01/24/2007  . Essential hypertension 01/24/2007  . GERD 01/24/2007  . RENAL CALCULUS, HX OF 01/24/2007    Current Outpatient Medications  Medication Sig Dispense Refill  . ALPRAZolam (XANAX) 0.25 MG tablet Take 1 tablet (0.25 mg total) by mouth at bedtime as needed for sleep. 30 tablet 2  . azelastine (OPTIVAR) 0.05 % ophthalmic solution Place 1 drop into both eyes as needed.    . calcium carbonate (TUMS CALCIUM FOR LIFE BONE) 750 MG chewable tablet Chew 2-4 tablets by mouth daily.      . cholecalciferol (VITAMIN D) 1000 UNITS tablet Take 1,000 Units by mouth daily.      . fexofenadine (ALLEGRA) 30 MG tablet Take 30 mg by mouth 2 (two) times daily.    . fluticasone (FLONASE) 50 MCG/ACT nasal spray Place into both nostrils daily.    . hyoscyamine (LEVSIN SL) 0.125 MG SL tablet Place 1 tablet (0.125 mg total) under the tongue every 4 (four) hours as needed for cramping. 30 tablet 4  . rosuvastatin (CRESTOR) 10 MG tablet Take 1 tablet (10 mg total) by mouth daily. -- Office visit needed for further refills 90 tablet 0  . spironolactone (ALDACTONE) 25 MG tablet TAKE 1 TABLET BY MOUTH EVERY DAY  90 tablet 0  . benzonatate (TESSALON PERLES) 100 MG capsule Take 2 capsules (200 mg total) by mouth 3 (three) times daily as needed for cough. 30 capsule 0  . doxycycline (VIBRA-TABS) 100 MG tablet Take 1 tablet (100 mg total) by mouth 2 (two) times daily. 20 tablet 0   Current Facility-Administered Medications  Medication Dose Route Frequency Provider Last Rate Last Dose  . botulinum toxin Type A (BOTOX) injection 200 Units  200 Units Intramuscular Once Tat, Rebecca S, DO        Allergies: Penicillins; Penicillin g; Adhesive [tape]; Bactrim [sulfamethoxazole-trimethoprim]; and Codeine  Past Medical History:  Diagnosis Date  . Anxiety   . Complication of anesthesia    states had flashing lights x 6 wks. post-op in 1992  . Dental crowns present   . Difficulty swallowing solids    states difficulty swallowing dry foods  . Diverticulosis of colon (without mention of hemorrhage)   . Eczema   . GERD (gastroesophageal reflux disease)   . H. pylori infection   . Hiatal hernia    states "small"  . History of kidney stones   . Hypertension    under control, has been on med. x 7 yrs.  . 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 Other and unspecified hyperlipidemia   . Palpitations    "all my life" - states no problems; states had stress test 2006; no cardiologist  . Post-nasal drip 04/10/2012  . Rash  breasts  . Spasmodic torticollis    has difficulty turning head to left side; receives Botox injections    Past Surgical History:  Procedure Laterality Date  . ABDOMINAL HYSTERECTOMY  1993   complete  . CATARACT EXTRACTION, BILATERAL  2007  . CESAREAN SECTION    . COLONOSCOPY  02/10/2013  . ESOPHAGOGASTRODUODENOSCOPY  02/10/2013  . ESOPHAGOSCOPY  04/14/2012   Procedure: ESOPHAGOSCOPY;  Surgeon: Rozetta Nunnery, MD;  Location: [facility];  Service: ENT;  Laterality: Right;  . LARYNX SURGERY  11/2011   vocal cord biopsy  . NASAL HEMORRHAGE CONTROL  04/14/2012    Procedure: EPISTAXIS CONTROL;  Surgeon: Rozetta Nunnery, MD;  Location: [facility];  Service: ENT;  Laterality: Right;  Cauterization of Right Septal Vessel  . OOPHORECTOMY  1992   x 1  . ORIF ULNAR FRACTURE  01/16/2000   left: also radial head dislocation  . TRANSPHENOIDAL / TRANSNASAL HYPOPHYSECTOMY / RESECTION PITUITARY TUMOR  1983    Family History  Problem Relation Age of Onset  . Coronary artery disease Mother   . Heart attack Mother   . Breast cancer Mother   . Colon cancer Mother   . Hypertension Father   . Thrombocytopenia Father   . Other Father        ITP  . Prostate cancer Brother   . AAA (abdominal aortic aneurysm) Brother     Social History   Tobacco Use  . Smoking status: Never Smoker  . Smokeless tobacco: Never Used  Substance Use Topics  . Alcohol use: No    Alcohol/week: 0.0 oz    Subjective:  5 day history of cough/ congestion; notes that head feels "full." States that she can "taste and smell" the infection; feels weak; using OTC Cold-Eze; numerous sick contacts recently; denies any fever; denies any chest pain or wheezing; does feel "burning" in her chest; difficult sleeping due to coughing; notes that she would be interested in CXR if appropriate due to chronic problems with sensation of being "short of breath."  Objective:  Vitals:   07/02/17 1350  BP: (!) 154/82  Pulse: (!) 102  Temp: 98.2 F (36.8 C)  TempSrc: Oral  SpO2: 98%  Weight: 196 lb (88.9 kg)  Height: 5\' 4"  (1.626 m)    General: Well developed, well nourished, in no acute distress  Skin : Warm and dry.  Head: Normocephalic and atraumatic  Eyes: Sclera and conjunctiva clear; pupils round and reactive to light; extraocular movements intact  Ears: External normal; canals clear; tympanic membranes normal  Oropharynx: Pink, supple. No suspicious lesions  Neck: Supple without thyromegaly, adenopathy  Lungs: Respirations unlabored; coarse breath sounds noted in lower  lobes; good improvement noted and clear to auscultation bilaterally without wheeze, rales, rhonchi  CVS exam: normal rate and regular rhythm.  Abdomen: Soft; nontender; nondistended; normoactive bowel sounds; no masses or hepatosplenomegaly  Musculoskeletal: No deformities; no active joint inflammation  Extremities: No edema, cyanosis, clubbing  Vessels: Symmetric bilaterally  Neurologic: Alert and oriented; speech intact; face symmetrical; moves all extremities well; CNII-XII intact without focal deficit  Assessment:  1. Cough   2. Acute bronchitis, unspecified organism     Plan:  Will update CXR per patient request; albuterol nebulizer treatment given in office with good benefit; Rx for Doxycycline 100 mg bid x 10 days; Rx for Tessalon Perles 200 mg tid; increase fluids, rest and follow-up worse, no better.   Suspect blood pressure is up to due illness;  she is to monitor and follow-up if consistently above 140/90.  No Follow-up on file.  Orders Placed This Encounter  Procedures  . DG Chest 2 View    Standing Status:   Future    Number of Occurrences:   1    Standing Expiration Date:   09/02/2018    Order Specific Question:   Reason for Exam (SYMPTOM  OR DIAGNOSIS REQUIRED)    Answer:   cough/ ? pneumonia RLL    Order Specific Question:   Preferred imaging location?    Answer:   [facility]    Order Specific Question:   Radiology Contrast Protocol - do NOT remove file path    Answer:   [email or web address]    Requested Prescriptions   Signed Prescriptions Disp Refills  . doxycycline (VIBRA-TABS) 100 MG tablet 20 tablet 0    Sig: Take 1 tablet (100 mg total) by mouth 2 (two) times daily.  . benzonatate (TESSALON PERLES) 100 MG capsule 30 capsule 0    Sig: Take 2 capsules (200 mg total) by mouth 3 (three) times daily as needed for cough.

## 2017-07-03 ENCOUNTER — Telehealth: Payer: Self-pay | Admitting: Internal Medicine

## 2017-07-03 NOTE — Telephone Encounter (Signed)
Pt. Reports she has had 2 loose stools since starting Doxycycline. Pt. Will try yogurt and probiotic with antibiotic use . Instructed to call back if this does not help.Verbalizes understanding.

## 2017-07-24 DIAGNOSIS — Z803 Family history of malignant neoplasm of breast: Secondary | ICD-10-CM | POA: Diagnosis not present

## 2017-07-24 DIAGNOSIS — Z1231 Encounter for screening mammogram for malignant neoplasm of breast: Secondary | ICD-10-CM | POA: Diagnosis not present

## 2017-08-15 ENCOUNTER — Ambulatory Visit (INDEPENDENT_AMBULATORY_CARE_PROVIDER_SITE_OTHER): Payer: Medicare Other | Admitting: Neurology

## 2017-08-15 DIAGNOSIS — G243 Spasmodic torticollis: Secondary | ICD-10-CM | POA: Diagnosis not present

## 2017-08-15 MED ORDER — ONABOTULINUMTOXINA 100 UNITS IJ SOLR
190.0000 [IU] | Freq: Once | INTRAMUSCULAR | Status: AC
  Administered 2017-08-15: 190 [IU] via INTRAMUSCULAR

## 2017-08-15 MED ORDER — BACLOFEN 10 MG PO TABS
10.0000 mg | ORAL_TABLET | Freq: Two times a day (BID) | ORAL | 2 refills | Status: DC
Start: 2017-08-15 — End: 2017-09-30

## 2017-08-15 MED ORDER — ONABOTULINUMTOXINA 100 UNITS IJ SOLR
50.0000 [IU] | Freq: Once | INTRAMUSCULAR | Status: AC
  Administered 2017-08-15: 50 [IU] via INTRAMUSCULAR

## 2017-08-15 NOTE — Procedures (Addendum)
[facility]   Procedure Note Botox  Attending: Dr. Wells Guiles Destinee Taber  Preoperative Diagnosis(es): Cervical Dystonia  Result History  Onset of effect: unknown Adverse Effects: dysphagia; decided to hold on levator scapulae.  Asked me to do trapezius injections again as more neck pain when excluded these  Consent obtained from: The patient Benefits discussed included, but were not limited to decreased muscle tightness, increased joint range of motion, and decreased pain.  Risk discussed included, but were not limited pain and discomfort, bleeding, bruising, excessive weakness, venous thrombosis, muscle atrophy and dysphagia.  A copy of the patient medication guide was given to the patient which explains the blackbox warning.  Patients identity and treatment sites confirmed Yes.  .  Details of Procedure: Skin was cleaned with alcohol.  A 30 gauge, 1/2 inch needle was introduced to the target muscle (except splenius capitus, posterior approach, where 27 inch, 1 1/2 gauge needle was used).  Prior to injection, the needle plunger was aspirated to make sure the needle was not within a blood vessel.  There was no blood retrieved on aspiration.    Following is a summary of the muscles injected  And the amount of Botulinum toxin used:   Dilution 0.9% preservative free saline mixed with 100 u Botox type A to make 10 U per 0.1cc  Injections  Location Left  Right Units Number of sites        Sternocleidomastoid 50/20  70 2  Splenius Capitus, posterior approach  80 80 1  Splenius Capitus, lateral approach  40 40 1  Levator Scapulae      Trapezius 20/20/10  50 3        TOTAL UNITS:   240    Agent: Botulinum Type A ( Onobotulinum Toxin type A ).  2 vials of Botox were used, each containing 100 units and freshly diluted with 1 mL of sterile, non-preserved saline   Total injected (Units): 240  Total wasted (Units): 0   Pt tolerated procedure well without complications.   Reinjection is  anticipated in 3 months.

## 2017-08-27 DIAGNOSIS — R490 Dysphonia: Secondary | ICD-10-CM | POA: Diagnosis not present

## 2017-08-29 ENCOUNTER — Other Ambulatory Visit: Payer: Self-pay | Admitting: Otolaryngology

## 2017-08-29 DIAGNOSIS — R498 Other voice and resonance disorders: Secondary | ICD-10-CM

## 2017-08-29 DIAGNOSIS — R490 Dysphonia: Secondary | ICD-10-CM

## 2017-09-09 ENCOUNTER — Other Ambulatory Visit: Payer: Medicare Other

## 2017-09-09 ENCOUNTER — Ambulatory Visit: Payer: Medicare Other | Admitting: Internal Medicine

## 2017-09-17 ENCOUNTER — Ambulatory Visit
Admission: RE | Admit: 2017-09-17 | Discharge: 2017-09-17 | Disposition: A | Discharge status: Home / Self Care | Payer: Medicare Other | Source: Ambulatory Visit | Attending: Otolaryngology | Admitting: Otolaryngology

## 2017-09-17 DIAGNOSIS — R490 Dysphonia: Secondary | ICD-10-CM | POA: Diagnosis not present

## 2017-09-17 DIAGNOSIS — R498 Other voice and resonance disorders: Secondary | ICD-10-CM

## 2017-09-24 ENCOUNTER — Other Ambulatory Visit: Payer: Self-pay | Admitting: Internal Medicine

## 2017-09-24 DIAGNOSIS — E876 Hypokalemia: Secondary | ICD-10-CM

## 2017-09-24 DIAGNOSIS — I10 Essential (primary) hypertension: Secondary | ICD-10-CM

## 2017-09-29 ENCOUNTER — Encounter: Payer: Self-pay | Admitting: Internal Medicine

## 2017-09-29 ENCOUNTER — Ambulatory Visit (INDEPENDENT_AMBULATORY_CARE_PROVIDER_SITE_OTHER): Payer: Medicare Other | Admitting: Internal Medicine

## 2017-09-29 ENCOUNTER — Other Ambulatory Visit (INDEPENDENT_AMBULATORY_CARE_PROVIDER_SITE_OTHER): Payer: Medicare Other

## 2017-09-29 VITALS — BP 130/70 | HR 105 | Temp 98.3°F | Resp 16 | Ht 64.0 in | Wt 195.2 lb

## 2017-09-29 DIAGNOSIS — D508 Other iron deficiency anemias: Secondary | ICD-10-CM | POA: Diagnosis not present

## 2017-09-29 DIAGNOSIS — M818 Other osteoporosis without current pathological fracture: Secondary | ICD-10-CM

## 2017-09-29 DIAGNOSIS — E559 Vitamin D deficiency, unspecified: Secondary | ICD-10-CM | POA: Insufficient documentation

## 2017-09-29 DIAGNOSIS — E876 Hypokalemia: Secondary | ICD-10-CM

## 2017-09-29 DIAGNOSIS — I10 Essential (primary) hypertension: Secondary | ICD-10-CM

## 2017-09-29 DIAGNOSIS — F411 Generalized anxiety disorder: Secondary | ICD-10-CM | POA: Diagnosis not present

## 2017-09-29 DIAGNOSIS — E785 Hyperlipidemia, unspecified: Secondary | ICD-10-CM

## 2017-09-29 LAB — URINALYSIS, ROUTINE W REFLEX MICROSCOPIC
BILIRUBIN URINE: NEGATIVE
HGB URINE DIPSTICK: NEGATIVE
Ketones, ur: NEGATIVE
NITRITE: NEGATIVE
RBC / HPF: NONE SEEN (ref 0–?)
Specific Gravity, Urine: <=1.005 — AB (ref 1.000–1.030)
Total Protein, Urine: NEGATIVE
URINE GLUCOSE: NEGATIVE
UROBILINOGEN UA: 0.2 (ref 0.0–1.0)
pH: 6 (ref 5.0–8.0)

## 2017-09-29 LAB — BASIC METABOLIC PANEL
BUN: 11 mg/dL (ref 6–23)
CALCIUM: 9.9 mg/dL (ref 8.4–10.5)
CO2: 32 mEq/L (ref 19–32)
CREATININE: 0.62 mg/dL (ref 0.40–1.20)
Chloride: 101 mEq/L (ref 96–112)
GFR: 100.46 mL/min (ref >60.00)
Glucose, Bld: 107 mg/dL — ABNORMAL HIGH (ref 70–99)
Potassium: 4.4 mEq/L (ref 3.5–5.1)
Sodium: 139 mEq/L (ref 135–145)

## 2017-09-29 LAB — VITAMIN D 25 HYDROXY (VIT D DEFICIENCY, FRACTURES): VITD: 29.09 ng/mL — AB (ref 30.00–100.00)

## 2017-09-29 LAB — CBC WITH DIFFERENTIAL/PLATELET
BASOS ABS: 0 10*3/uL (ref 0.0–0.1)
Basophils Relative: 0.5 % (ref 0.0–3.0)
EOS ABS: 0.3 10*3/uL (ref 0.0–0.7)
Eosinophils Relative: 5.7 % — ABNORMAL HIGH (ref 0.0–5.0)
HCT: 46.2 % — ABNORMAL HIGH (ref 36.0–46.0)
HEMOGLOBIN: 15.4 g/dL — AB (ref 12.0–15.0)
Lymphocytes Relative: 15.9 % (ref 12.0–46.0)
Lymphs Abs: 0.9 10*3/uL (ref 0.7–4.0)
MCHC: 33.2 g/dL (ref 30.0–36.0)
MCV: 89.4 fl (ref 78.0–100.0)
MONO ABS: 0.5 10*3/uL (ref 0.1–1.0)
Monocytes Relative: 8 % (ref 3.0–12.0)
Neutro Abs: 4 10*3/uL (ref 1.4–7.7)
Neutrophils Relative %: 69.9 % (ref 43.0–77.0)
Platelets: 228 10*3/uL (ref 150.0–400.0)
RBC: 5.17 Mil/uL — AB (ref 3.87–5.11)
RDW: 14 % (ref 11.5–15.5)
WBC: 5.7 10*3/uL (ref 4.0–10.5)

## 2017-09-29 LAB — MAGNESIUM: MAGNESIUM: 1.8 mg/dL (ref 1.5–2.5)

## 2017-09-29 MED ORDER — CHOLECALCIFEROL 50 MCG (2000 UT) PO TABS: 1.0000 | ORAL_TABLET | Freq: Every day | ORAL | 1 refills | Status: DC

## 2017-09-29 MED ORDER — ALPRAZOLAM 0.25 MG PO TABS: 0.2500 mg | ORAL_TABLET | Freq: Every evening | ORAL | 3 refills | Status: DC | PRN

## 2017-09-29 MED ORDER — SPIRONOLACTONE 25 MG PO TABS: 25.0000 mg | ORAL_TABLET | Freq: Every day | ORAL | 1 refills | Status: DC

## 2017-09-29 MED ORDER — ROSUVASTATIN CALCIUM 10 MG PO TABS: 10.0000 mg | ORAL_TABLET | Freq: Every day | ORAL | 1 refills | Status: DC

## 2017-09-29 NOTE — Patient Instructions (Signed)

## 2017-09-29 NOTE — Progress Notes (Signed)
Subjective:  Patient ID: Katie Stout, female    DOB: 05/05/1946  Age: 72 y.o. MRN: [id number]  CC: Hypertension   HPI Baylie Drakes Vanengen presents for a BP check - Her blood pressure at home has consistently been around 130/70.  She struggles with chronic shortness of breath but over the last year has completed an extensive cardiac workup that has been unremarkable.  She is scheduled for ENT surgery in approximately 1 month.  She denies any episodes of headache, blurred vision, chest pain, diaphoresis, edema, or fatigue.  Outpatient Medications Prior to Visit  Medication Sig Dispense Refill  . azelastine (OPTIVAR) 0.05 % ophthalmic solution Place 1 drop into both eyes as needed.    . baclofen (LIORESAL) 10 MG tablet Take 1 tablet (10 mg total) by mouth 2 (two) times daily. 60 tablet 2  . calcium carbonate (TUMS CALCIUM FOR LIFE BONE) 750 MG chewable tablet Chew 2-4 tablets by mouth daily.      . fexofenadine (ALLEGRA) 30 MG tablet Take 30 mg by mouth 2 (two) times daily.    . fluticasone (FLONASE) 50 MCG/ACT nasal spray Place into both nostrils daily.    . hyoscyamine (LEVSIN SL) 0.125 MG SL tablet Place 1 tablet (0.125 mg total) under the tongue every 4 (four) hours as needed for cramping. 30 tablet 4  . ALPRAZolam (XANAX) 0.25 MG tablet Take 1 tablet (0.25 mg total) by mouth at bedtime as needed for sleep. 30 tablet 2  . cholecalciferol (VITAMIN D) 1000 UNITS tablet Take 1,000 Units by mouth daily.      . rosuvastatin (CRESTOR) 10 MG tablet Take 1 tablet (10 mg total) by mouth daily. -- Office visit needed for further refills 90 tablet 0  . spironolactone (ALDACTONE) 25 MG tablet TAKE 1 TABLET BY MOUTH EVERY DAY 90 tablet 0  . benzonatate (TESSALON PERLES) 100 MG capsule Take 2 capsules (200 mg total) by mouth 3 (three) times daily as needed for cough. 30 capsule 0  . doxycycline (VIBRA-TABS) 100 MG tablet Take 1 tablet (100 mg total) by mouth 2 (two) times daily. 20 tablet 0    Facility-Administered Medications Prior to Visit  Medication Dose Route Frequency Provider Last Rate Last Dose  . botulinum toxin Type A (BOTOX) injection 200 Units  200 Units Intramuscular Once Tat, Rebecca S, DO        ROS Review of Systems  Constitutional: Negative.  Negative for diaphoresis, fatigue and unexpected weight change.  HENT: Positive for voice change.   Eyes: Negative for visual disturbance.  Respiratory: Positive for shortness of breath. Negative for cough, choking, chest tightness, wheezing and stridor.   Cardiovascular: Negative.  Negative for chest pain, palpitations and leg swelling.  Gastrointestinal: Negative for abdominal pain, constipation, diarrhea, nausea and vomiting.  Endocrine: Negative.   Genitourinary: Negative.   Musculoskeletal: Negative.  Negative for arthralgias and myalgias.  Skin: Negative.   Allergic/Immunologic: Negative.   Neurological: Negative.  Negative for dizziness, weakness, light-headedness and headaches.  Hematological: Negative for adenopathy. Does not bruise/bleed easily.  Psychiatric/Behavioral: Positive for sleep disturbance. Negative for dysphoric mood and suicidal ideas. The patient is nervous/anxious.     Objective:  BP 130/70 (BP Location: Left Arm, Patient Position: Sitting, Cuff Size: Normal)   Pulse (!) 105   Temp 98.3 F (36.8 C) (Oral)   Resp 16   Ht 5\' 4"  (1.626 m)   Wt 195 lb 4 oz (88.6 kg)   SpO2 98%   BMI 33.51 kg/m  BP Readings from Last 3 Encounters:  09/29/17 130/70  07/02/17 (!) 154/82  04/18/17 (!) 144/86    Wt Readings from Last 3 Encounters:  09/29/17 195 lb 4 oz (88.6 kg)  07/02/17 196 lb (88.9 kg)  05/01/17 193 lb (87.5 kg)    Physical Exam  Constitutional: She is oriented to person, place, and time.  Non-toxic appearance. She does not have a sickly appearance. She does not appear ill. No distress.  She has a raspy, strained voice.  HENT:  Mouth/Throat: Oropharynx is clear and moist. No  oropharyngeal exudate.  Eyes: Conjunctivae are normal. Left eye exhibits no discharge. No scleral icterus.  Neck: Normal range of motion. No JVD present. No thyromegaly present.  Cardiovascular: Normal rate, regular rhythm and normal heart sounds. Exam reveals no gallop.  No murmur heard. Pulmonary/Chest: Effort normal and breath sounds normal. No respiratory distress. She has no wheezes. She has no rales.  Abdominal: Soft. Bowel sounds are normal. She exhibits no distension and no mass. There is no tenderness. There is no guarding.  Musculoskeletal: Normal range of motion. She exhibits no edema or tenderness.  Lymphadenopathy:    She has no cervical adenopathy.  Neurological: She is alert and oriented to person, place, and time.  Skin: Skin is warm and dry. No rash noted. She is not diaphoretic. No erythema. No pallor.  Vitals reviewed.   Lab Results  Component Value Date   WBC 5.7 09/29/2017   HGB 15.4 (H) 09/29/2017   HCT 46.2 (H) 09/29/2017   PLT 228.0 09/29/2017   GLUCOSE 107 (H) 09/29/2017   CHOL 152 03/12/2017   TRIG 105.0 03/12/2017   HDL 54.70 03/12/2017   LDLDIRECT 132.2 06/10/2012   LDLCALC 77 03/12/2017   ALT 32 12/18/2016   AST 28 12/18/2016   NA 139 09/29/2017   K 4.4 09/29/2017   CL 101 09/29/2017   CREATININE 0.62 09/29/2017   BUN 11 09/29/2017   CO2 32 09/29/2017   TSH 3.06 12/18/2016   HGBA1C 5.9 12/26/2006    Ct Soft Tissue Neck W Contrast  Result Date: 09/17/2017 CLINICAL DATA:  Neurologic dysphonia EXAM: CT NECK WITH CONTRAST TECHNIQUE: Multidetector CT imaging of the neck was performed using the standard protocol following the bolus administration of intravenous contrast. Creatinine was obtained on site at [facility] at [address] Results: Creatinine 0.7 mg/dL. CONTRAST:  Reference EMR COMPARISON:  03/10/2015 FINDINGS: Pharynx and larynx: There is a gas filled structure in the right paraglottic fat consistent with laryngocele, neck not  resolved. At time of imaging the sac measured 12 mm. There is a smooth submucosal soft tissue density nodule symmetrically on the left that is stable. There is mild supraglottic airway distortion, greater on the right. Salivary glands: No inflammation, mass, or stone. Thyroid: 5 mm left thyroid nodule, incidental at this small size. Lymph nodes: None enlarged or abnormal density Vascular: Atherosclerotic calcification of the aorta and cervical carotids. Limited intracranial: Negative Visualized orbits: Bilateral cataract resection. Mastoids and visualized paranasal sinuses: Minor mucosal thickening on the floors of the maxillary sinuses. Secretions present in the right maxillary sinus. Skeleton: No acute or aggressive finding cervical facet arthropathy with multilevel ankylosis. Thoracic scoliosis. Upper chest: Biapical pleural scarring. IMPRESSION: 1. 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 2. Symmetric soft tissue density mass in the left paraglottic fat that favors opacified internal laryngocele, stable from 2016. Electronically Signed   By: Monte Fantasia  M.D.   On: 09/17/2017 14:54    Assessment & Plan:   Cataleia was seen today for hypertension.  Diagnoses and all orders for this visit:  Essential hypertension- Her blood pressure is adequately well controlled.  Electrolytes and renal function are normal.  I will treat the vitamin D deficiency. -     Magnesium; Future -     Basic metabolic panel; Future -     Urinalysis, Routine w reflex microscopic; Future -     spironolactone (ALDACTONE) 25 MG tablet; Take 1 tablet (25 mg total) by mouth daily.  Other osteoporosis, unspecified pathological fracture presence -     VITAMIN D 25 Hydroxy (Vit-D Deficiency, Fractures); Future -     Cholecalciferol 2000 units TABS; Take 1 tablet (2,000 Units total) by mouth daily.  Other iron deficiency anemia- Her H&H are mildly elevated.  I  think this is related to dehydration.  I have asked her to stop taking any iron supplements. -     CBC with Differential/Platelet; Future  Vitamin D deficiency -     VITAMIN D 25 Hydroxy (Vit-D Deficiency, Fractures); Future -     rosuvastatin (CRESTOR) 10 MG tablet; Take 1 tablet (10 mg total) by mouth daily. -     Cholecalciferol 2000 units TABS; Take 1 tablet (2,000 Units total) by mouth daily.  Hypokalemia- Her potassium level is in the normal range.  I will continue spironolactone at the current dose. -     spironolactone (ALDACTONE) 25 MG tablet; Take 1 tablet (25 mg total) by mouth daily.  Hyperlipidemia with target LDL less than 130- She has achieved her LDL goal and is doing well on the statin. -     rosuvastatin (CRESTOR) 10 MG tablet; Take 1 tablet (10 mg total) by mouth daily.  GAD (generalized anxiety disorder) -     ALPRAZolam (XANAX) 0.25 MG tablet; Take 1 tablet (0.25 mg total) by mouth at bedtime as needed for sleep.   I have discontinued Harriet Masson. Giovannini's cholecalciferol, doxycycline, and benzonatate. I have also changed her spironolactone and rosuvastatin. Additionally, I am having her start on Cholecalciferol. Lastly, I am having her maintain her calcium carbonate, fexofenadine, hyoscyamine, fluticasone, azelastine, baclofen, and ALPRAZolam. We will continue to administer botulinum toxin Type A.  Meds ordered this encounter  Medications  . spironolactone (ALDACTONE) 25 MG tablet    Sig: Take 1 tablet (25 mg total) by mouth daily.    Dispense:  90 tablet    Refill:  1  . rosuvastatin (CRESTOR) 10 MG tablet    Sig: Take 1 tablet (10 mg total) by mouth daily.    Dispense:  90 tablet    Refill:  1    D/C last RX  . ALPRAZolam (XANAX) 0.25 MG tablet    Sig: Take 1 tablet (0.25 mg total) by mouth at bedtime as needed for sleep.    Dispense:  30 tablet    Refill:  3  . Cholecalciferol 2000 units TABS    Sig: Take 1 tablet (2,000 Units total) by mouth daily.     Dispense:  90 tablet    Refill:  1     Follow-up: Return in about 6 months (around 04/01/2018).  [name], MD

## 2017-09-30 ENCOUNTER — Encounter: Payer: Self-pay | Admitting: Internal Medicine

## 2017-10-01 ENCOUNTER — Telehealth: Payer: Self-pay | Admitting: Internal Medicine

## 2017-10-01 DIAGNOSIS — R490 Dysphonia: Secondary | ICD-10-CM | POA: Diagnosis not present

## 2017-10-01 NOTE — Telephone Encounter (Signed)
Copied from [place] [phone]. Topic: General - Other >> Oct 01, 2017  8:17 AM Darl Householder, RMA wrote: Reason for CRM: Patient is requesting a call back concerning Vitamin D3, pt wants to know if she can take 2 rather than 1

## 2017-10-01 NOTE — Telephone Encounter (Signed)
LVM for pt to call back as soon as possible.   

## 2017-10-01 NOTE — Telephone Encounter (Signed)
Katie Stout 10/01/2017 03:12 PM  Summary: Pt has everything taken care of.    Pt said to let CMA know she has her Vitamin D and everything is ok.

## 2017-10-07 ENCOUNTER — Telehealth: Payer: Self-pay | Admitting: Internal Medicine

## 2017-10-07 NOTE — Telephone Encounter (Signed)
Copied from [place] [id number]. Topic: Quick Communication - See Telephone Encounter >> Oct 07, 2017 11:32 AM Robina Ade, Helene Kelp D wrote: CRM for notification. See Telephone encounter for: 10/07/17. Patient called and said she would like to talk to Dr. Ronnald Ramp CMA about stopping her Vitamin D due to her surgery she is having 10/23/17. Please call patient back, thanks.

## 2017-10-07 NOTE — Telephone Encounter (Signed)
Called pt back and informed that the medications that she takes prior to surgery is up to the surgeons discretion.

## 2017-10-23 ENCOUNTER — Other Ambulatory Visit: Payer: Self-pay | Admitting: Otolaryngology

## 2017-10-23 DIAGNOSIS — R490 Dysphonia: Secondary | ICD-10-CM | POA: Diagnosis not present

## 2017-10-23 DIAGNOSIS — E854 Organ-limited amyloidosis: Secondary | ICD-10-CM | POA: Diagnosis not present

## 2017-10-23 DIAGNOSIS — D141 Benign neoplasm of larynx: Secondary | ICD-10-CM | POA: Diagnosis not present

## 2017-10-23 DIAGNOSIS — E8589 Other amyloidosis: Secondary | ICD-10-CM | POA: Diagnosis not present

## 2017-11-14 ENCOUNTER — Ambulatory Visit: Payer: Medicare Other | Admitting: Neurology

## 2017-12-26 ENCOUNTER — Telehealth: Payer: Self-pay | Admitting: Physical Medicine & Rehabilitation

## 2017-12-26 NOTE — Telephone Encounter (Signed)
Left subdeltoid injection ultrasound-guided please schedule

## 2017-12-26 NOTE — Telephone Encounter (Signed)
Pt would like schedule an arm injection instead of her botox injections that she usually gets with Dr Tat...  Please advise on when & what to schedule?   Thanks, Vilinda Blanks

## 2017-12-28 ENCOUNTER — Encounter: Payer: Self-pay | Admitting: Internal Medicine

## 2018-01-01 DIAGNOSIS — Z961 Presence of intraocular lens: Secondary | ICD-10-CM | POA: Diagnosis not present

## 2018-01-01 DIAGNOSIS — H524 Presbyopia: Secondary | ICD-10-CM | POA: Diagnosis not present

## 2018-01-02 ENCOUNTER — Ambulatory Visit: Payer: Medicare Other | Admitting: Neurology

## 2018-01-06 ENCOUNTER — Encounter: Payer: Self-pay | Admitting: Physical Medicine & Rehabilitation

## 2018-01-06 ENCOUNTER — Ambulatory Visit (HOSPITAL_BASED_OUTPATIENT_CLINIC_OR_DEPARTMENT_OTHER): Payer: Medicare Other | Admitting: Physical Medicine & Rehabilitation

## 2018-01-06 ENCOUNTER — Encounter: Payer: Medicare Other | Attending: Physical Medicine & Rehabilitation

## 2018-01-06 VITALS — BP 142/89 | HR 104 | Ht 65.0 in | Wt 192.0 lb

## 2018-01-06 DIAGNOSIS — M25512 Pain in left shoulder: Secondary | ICD-10-CM | POA: Insufficient documentation

## 2018-01-06 NOTE — Progress Notes (Signed)
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 Pain interferes with ADLs and sleep  The patient was placed in a seated position the acromioclavicular joint was scanned in the long axis view, areas marked prepped with Betadine, sterile technique was utilized. Under direct long axis view 1% lidocaine was infiltrated to the skin and subcutaneous tissues. A 25-gauge 1.5 inch needle reached the acromioclavicular joint space. Then a solution containing 1 mL of lidocaine 1% and 0.5 ML of Celestone 6 mg per mL was injected. Patient tolerated procedure well. Images were saved. Postprocedure instructions given  

## 2018-01-06 NOTE — Patient Instructions (Signed)
Left acromioclavicular injection Celestone and lidocaine

## 2018-01-16 ENCOUNTER — Ambulatory Visit: Payer: Medicare Other | Attending: Otolaryngology

## 2018-01-16 DIAGNOSIS — R490 Dysphonia: Secondary | ICD-10-CM | POA: Insufficient documentation

## 2018-01-16 NOTE — Therapy (Signed)
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 Because pt's vibratory pattern may or may not have changed with her last surgery, this SLP believes it best to have a full voice clinic evaluation (including SLP and ENT) prior to any additional voice therapy at this center. Pt was advised of this and agrees with this plan. This SLP will contact pt's referring MD and previous SLP with this information.  [facility] ,[place], [place]  01/16/2018, 2:51 PM  [facility] [address] [place], [place], [address] Phone: [phone]   Fax:  [phone]

## 2018-01-29 ENCOUNTER — Ambulatory Visit: Payer: Medicare Other | Admitting: Gastroenterology

## 2018-02-15 ENCOUNTER — Other Ambulatory Visit: Payer: Self-pay | Admitting: Internal Medicine

## 2018-02-15 DIAGNOSIS — I10 Essential (primary) hypertension: Secondary | ICD-10-CM

## 2018-02-15 DIAGNOSIS — E876 Hypokalemia: Secondary | ICD-10-CM

## 2018-02-16 ENCOUNTER — Telehealth: Payer: Self-pay | Admitting: Physical Medicine & Rehabilitation

## 2018-02-16 NOTE — Telephone Encounter (Signed)
May schedule for biceps tendon injection under US guidance

## 2018-02-16 NOTE — Telephone Encounter (Signed)
Patient called and states she is in pain can she get the injection earlier??  Please call back if it's possible

## 2018-02-18 ENCOUNTER — Ambulatory Visit: Payer: Medicare Other | Admitting: Gastroenterology

## 2018-02-18 NOTE — Telephone Encounter (Signed)
Left voicemail for patient to call us back to get this injection scheduled.

## 2018-02-23 NOTE — Telephone Encounter (Signed)
Scheduled for 03/06/18.

## 2018-02-25 DIAGNOSIS — J387 Other diseases of larynx: Secondary | ICD-10-CM | POA: Diagnosis not present

## 2018-02-25 DIAGNOSIS — Q310 Web of larynx: Secondary | ICD-10-CM | POA: Diagnosis not present

## 2018-02-25 DIAGNOSIS — J383 Other diseases of vocal cords: Secondary | ICD-10-CM | POA: Diagnosis not present

## 2018-02-25 DIAGNOSIS — J384 Edema of larynx: Secondary | ICD-10-CM | POA: Diagnosis not present

## 2018-02-25 DIAGNOSIS — E859 Amyloidosis, unspecified: Secondary | ICD-10-CM | POA: Diagnosis not present

## 2018-02-25 DIAGNOSIS — R490 Dysphonia: Secondary | ICD-10-CM | POA: Diagnosis not present

## 2018-03-06 ENCOUNTER — Encounter: Payer: Medicare Other | Attending: Physical Medicine & Rehabilitation

## 2018-03-06 ENCOUNTER — Encounter

## 2018-03-06 ENCOUNTER — Ambulatory Visit (HOSPITAL_BASED_OUTPATIENT_CLINIC_OR_DEPARTMENT_OTHER): Payer: Medicare Other | Admitting: Physical Medicine & Rehabilitation

## 2018-03-06 ENCOUNTER — Encounter: Payer: Self-pay | Admitting: Physical Medicine & Rehabilitation

## 2018-03-06 VITALS — BP 165/83 | HR 98 | Ht 64.5 in | Wt 193.8 lb

## 2018-03-06 DIAGNOSIS — M7522 Bicipital tendinitis, left shoulder: Secondary | ICD-10-CM

## 2018-03-06 DIAGNOSIS — M25512 Pain in left shoulder: Secondary | ICD-10-CM | POA: Insufficient documentation

## 2018-03-06 NOTE — Progress Notes (Signed)
Bicipital tendon sheath injection  Indication biceps Tenosynovitis which does not respond to conservative care including medications and therapy and interferes with activity causing pain  Informed consent was obtained after describing risks and benefits of the procedure with the patient including bleeding bruising and infection he elects to proceed and has given written consent Patient placed in a supine position palm up with the hand at the side. Area was scant using 12 Hz linear transducer. Biceps tendon just distal to the groove was identified. Area prepped with Betadine. Needle track was anesthetize with 1% lidocaine x2 ML. The tendon sheath was entered using cross sectional imaging and injection with Celestone 6 mg per mL x0.5 mL as well as lidocaine 1% times one ML. Patient tolerated procedure well. Post procedure instructions given.  Increase fluid was seen surrounding the biceps tendon, also shallow groove was noted with subluxation of tendon during left shoulder external rotation Post injection had ~90% relief of shoulder pain  Return to clinic one month for followup

## 2018-03-06 NOTE — Patient Instructions (Signed)
Biceps  Biceps Tendon Subluxation Biceps tendon subluxation is an injury to the shoulder area. The biceps muscle is located on the front side of the upper arm. When this muscle contracts, it causes the forearm to bend at the elbow joint. Biceps muscle contraction also assists in raising the arm at the shoulder joint. The place on a muscle where it attaches to a bone is called a tendon. Biceps muscles have three tendons. One tendon attaches at the elbow, and the other two tendons attach at the shoulder. One of the tendons that attaches at the shoulder runs through a groove in the bone before it attaches at the shoulder. Biceps tendon subluxation occurs when the tendon moves out of this groove during arm movement. This injury often occurs along with other problems in the shoulder. What are the causes? Biceps tendon subluxation can happen after an injury to the group of muscles and tendons that surround the shoulder joint (rotator cuff). Damage to the rotator cuff is often caused by strenuous or repetitive movements of the shoulder joint when the arm is extended away from the body and raised overhead. It can also occur with sudden, forceful movements in those directions. What increases the risk? This injury is more likely to occur in:  People who participate in contact sports, throwing sports, weight lifting, and bodybuilding.  People who perform heavy labor, especially involving lifting and overhead work.  People who have poor strength and flexibility.  People who do not warm up properly before practice or play.  What are the signs or symptoms? Symptoms of this injury include:  A crackling, clicking, or popping sound that happens when the arm is moved away from the body and the shoulder is rotated outward. You may or may not have pain during this movement.  Pain and tenderness in the front of the shoulder.  Pain that increases with shoulder and elbow motion, such as bending the elbow and  turning your palm upward against resistance.  How is this diagnosed? This injury can be difficult to diagnose because the symptoms are similar to those of many other upper arm and shoulder problems. The injury may be diagnosed based on:  Your symptoms and medical history. Your health care provider will ask for details about your injury and ask about activities that make your symptoms worse.  A physical exam.  Imaging tests such as: ? X-rays. ? CT scan. ? Ultrasound.  How is this treated? Initial treatment for this injury may include:  Medicines to control pain.  Icing the injured area to reduce swelling (inflammation).  You may also need to work with a physical therapist to strengthen and stabilize the injured area. In severe cases, surgery may be needed to put the biceps tendon back into the proper position and to prevent the injury from happening again. If you have other injuries to your shoulder, those may be repaired during surgery as well. Follow these instructions at home:  Take over-the-counter and prescription medicines only as told by your health care provider.  Do not drive or operate heavy machinery while taking prescription pain medicine.  If directed, apply ice to the injured area: ? Put ice in a plastic bag. ? Place a towel between your skin and the bag. ? Leave the ice on for 20 minutes, 2-3 times per day.  Return to your normal activities as told by your health care provider. Ask your health care provider what activities are safe for you.  Begin exercising the injured area  as told by your health care provider or physical therapist.  Keep all follow-up visits as told by your health care provider. This is important. Contact a health care provider if:  Your symptoms get worse or they do not improve with your treatment plan.  You have new or unexplained symptoms, such as pain, tingling, or numbness in or near the injured area. This information is not intended  to replace advice given to you by your health care provider. Make sure you discuss any questions you have with your health care provider. Document Released: 06/24/2005 Document Revised: 11/30/2015 Document Reviewed: 08/25/2014 Elsevier Interactive Patient Education  [employer].

## 2018-03-24 ENCOUNTER — Encounter: Payer: Self-pay | Admitting: Internal Medicine

## 2018-03-24 ENCOUNTER — Ambulatory Visit (INDEPENDENT_AMBULATORY_CARE_PROVIDER_SITE_OTHER): Payer: Medicare Other | Admitting: Internal Medicine

## 2018-03-24 ENCOUNTER — Other Ambulatory Visit (INDEPENDENT_AMBULATORY_CARE_PROVIDER_SITE_OTHER): Payer: Medicare Other

## 2018-03-24 VITALS — BP 164/90 | HR 90 | Temp 98.2°F | Resp 16 | Ht 64.5 in | Wt 194.5 lb

## 2018-03-24 DIAGNOSIS — E785 Hyperlipidemia, unspecified: Secondary | ICD-10-CM

## 2018-03-24 DIAGNOSIS — L309 Dermatitis, unspecified: Secondary | ICD-10-CM | POA: Insufficient documentation

## 2018-03-24 DIAGNOSIS — Z23 Encounter for immunization: Secondary | ICD-10-CM

## 2018-03-24 DIAGNOSIS — E876 Hypokalemia: Secondary | ICD-10-CM | POA: Diagnosis not present

## 2018-03-24 DIAGNOSIS — M818 Other osteoporosis without current pathological fracture: Secondary | ICD-10-CM | POA: Diagnosis not present

## 2018-03-24 DIAGNOSIS — E559 Vitamin D deficiency, unspecified: Secondary | ICD-10-CM

## 2018-03-24 DIAGNOSIS — I10 Essential (primary) hypertension: Secondary | ICD-10-CM

## 2018-03-24 LAB — URINALYSIS, ROUTINE W REFLEX MICROSCOPIC
Bilirubin Urine: NEGATIVE
KETONES UR: NEGATIVE
LEUKOCYTES UA: NEGATIVE
NITRITE: NEGATIVE
PH: 6 (ref 5.0–8.0)
RBC / HPF: NONE SEEN (ref 0–?)
Specific Gravity, Urine: <=1.005 — AB (ref 1.000–1.030)
TOTAL PROTEIN, URINE-UPE24: NEGATIVE
UROBILINOGEN UA: 0.2 (ref 0.0–1.0)
Urine Glucose: NEGATIVE

## 2018-03-24 LAB — COMPREHENSIVE METABOLIC PANEL
ALT: 33 U/L (ref 0–35)
AST: 23 U/L (ref 0–37)
Albumin: 4.5 g/dL (ref 3.5–5.2)
Alkaline Phosphatase: 60 U/L (ref 39–117)
BUN: 9 mg/dL (ref 6–23)
CALCIUM: 9.6 mg/dL (ref 8.4–10.5)
CHLORIDE: 102 meq/L (ref 96–112)
CO2: 29 mEq/L (ref 19–32)
Creatinine, Ser: 0.66 mg/dL (ref 0.40–1.20)
GFR: 93.35 mL/min (ref >60.00)
GLUCOSE: 98 mg/dL (ref 70–99)
Potassium: 3.9 mEq/L (ref 3.5–5.1)
Sodium: 141 mEq/L (ref 135–145)
Total Bilirubin: 2.5 mg/dL — ABNORMAL HIGH (ref 0.2–1.2)
Total Protein: 7.4 g/dL (ref 6.0–8.3)

## 2018-03-24 LAB — CBC WITH DIFFERENTIAL/PLATELET
BASOS PCT: 0.4 % (ref 0.0–3.0)
Basophils Absolute: 0 10*3/uL (ref 0.0–0.1)
EOS PCT: 4.4 % (ref 0.0–5.0)
Eosinophils Absolute: 0.2 10*3/uL (ref 0.0–0.7)
HCT: 44.3 % (ref 36.0–46.0)
Hemoglobin: 14.9 g/dL (ref 12.0–15.0)
Lymphocytes Relative: 16 % (ref 12.0–46.0)
Lymphs Abs: 0.9 10*3/uL (ref 0.7–4.0)
MCHC: 33.6 g/dL (ref 30.0–36.0)
MCV: 89.9 fl (ref 78.0–100.0)
MONO ABS: 0.4 10*3/uL (ref 0.1–1.0)
Monocytes Relative: 7 % (ref 3.0–12.0)
NEUTROS PCT: 72.2 % (ref 43.0–77.0)
Neutro Abs: 4.1 10*3/uL (ref 1.4–7.7)
PLATELETS: 214 10*3/uL (ref 150.0–400.0)
RBC: 4.92 Mil/uL (ref 3.87–5.11)
RDW: 14.6 % (ref 11.5–15.5)
WBC: 5.7 10*3/uL (ref 4.0–10.5)

## 2018-03-24 LAB — LIPID PANEL
CHOLESTEROL: 151 mg/dL (ref 0–200)
HDL: 61.7 mg/dL (ref >39.00)
LDL Cholesterol: 68 mg/dL (ref 0–99)
NonHDL: 88.82
Total CHOL/HDL Ratio: 2
Triglycerides: 102 mg/dL (ref 0.0–149.0)
VLDL: 20.4 mg/dL (ref 0.0–40.0)

## 2018-03-24 LAB — VITAMIN D 25 HYDROXY (VIT D DEFICIENCY, FRACTURES): VITD: 34.53 ng/mL (ref 30.00–100.00)

## 2018-03-24 LAB — TSH: TSH: 3.38 u[IU]/mL (ref 0.35–4.50)

## 2018-03-24 MED ORDER — SPIRONOLACTONE 25 MG PO TABS: 25.0000 mg | ORAL_TABLET | Freq: Every day | ORAL | 1 refills | Status: DC

## 2018-03-24 MED ORDER — CRISABOROLE 2 % EX OINT: 1.0000 | TOPICAL_OINTMENT | Freq: Two times a day (BID) | CUTANEOUS | 5 refills | Status: DC

## 2018-03-24 MED ORDER — ROSUVASTATIN CALCIUM 10 MG PO TABS: 10.0000 mg | ORAL_TABLET | Freq: Every day | ORAL | 1 refills | Status: DC

## 2018-03-24 MED ORDER — CHOLECALCIFEROL 50 MCG (2000 UT) PO TABS
1.0000 | ORAL_TABLET | Freq: Every day | ORAL | 1 refills | Status: AC
Start: 2018-03-24 — End: ?

## 2018-03-24 NOTE — Progress Notes (Signed)
Subjective:  Patient ID: Katie Stout, female    DOB: [birth year]  Age: 72 y.o. MRN: [id number]  CC: Hypertension; Hyperlipidemia; and Rash   HPI Katie Stout presents for f/up - She tells me her blood pressure at home is always less than 130/80.  She tells me whenever she goes into her doctor's office her blood pressure goes up.  She denies any recent episodes of headache, blurred vision, chest pain, shortness of breath, palpitations, edema, or fatigue.  She complains of an itchy rash on her right index finger and an itchy area over her right trapezius.  She is not currently treating this.  She has had the symptoms before.  Outpatient Medications Prior to Visit  Medication Sig Dispense Refill  . ALPRAZolam (XANAX) 0.25 MG tablet Take 1 tablet (0.25 mg total) by mouth at bedtime as needed for sleep. 30 tablet 3  . azelastine (OPTIVAR) 0.05 % ophthalmic solution Place 1 drop into both eyes as needed.    . calcium carbonate (TUMS CALCIUM FOR LIFE BONE) 750 MG chewable tablet Chew 2-4 tablets by mouth daily.      . fexofenadine (ALLEGRA) 30 MG tablet Take 30 mg by mouth 2 (two) times daily.    . fluticasone (FLONASE) 50 MCG/ACT nasal spray Place into both nostrils daily.    . hyoscyamine (LEVSIN SL) 0.125 MG SL tablet Place 1 tablet (0.125 mg total) under the tongue every 4 (four) hours as needed for cramping. 30 tablet 4  . Cholecalciferol 2000 units TABS Take 1 tablet (2,000 Units total) by mouth daily. 90 tablet 1  . rosuvastatin (CRESTOR) 10 MG tablet Take 1 tablet (10 mg total) by mouth daily. 90 tablet 1  . spironolactone (ALDACTONE) 25 MG tablet TAKE 1 TABLET BY MOUTH EVERY DAY 90 tablet 0   Facility-Administered Medications Prior to Visit  Medication Dose Route Frequency Provider Last Rate Last Dose  . botulinum toxin Type A (BOTOX) injection 200 Units  200 Units Intramuscular Once Tat, Rebecca S, DO        ROS Review of Systems  Constitutional: Negative for chills,  diaphoresis, fatigue and fever.  HENT: Negative.   Eyes: Negative for visual disturbance.  Respiratory: Negative for cough, chest tightness, shortness of breath and wheezing.   Cardiovascular: Negative for chest pain, palpitations and leg swelling.  Gastrointestinal: Negative for abdominal pain, constipation, diarrhea, nausea and vomiting.  Endocrine: Negative.   Genitourinary: Negative.  Negative for difficulty urinating.  Musculoskeletal: Negative.  Negative for arthralgias, myalgias and neck pain.  Skin: Positive for rash. Negative for color change.  Neurological: Negative.  Negative for dizziness, weakness, light-headedness and headaches.  Hematological: Negative for adenopathy. Does not bruise/bleed easily.  Psychiatric/Behavioral: Negative.     Objective:  BP (!) 164/90 (BP Location: Left Arm, Patient Position: Sitting, Cuff Size: Normal)   Pulse 90   Temp 98.2 F (36.8 C) (Oral)   Resp 16   Ht 5' 4.5" (1.638 m)   Wt 194 lb 8 oz (88.2 kg)   SpO2 95%   BMI 32.87 kg/m   BP Readings from Last 3 Encounters:  03/24/18 (!) 164/90  03/06/18 (!) 165/83  01/06/18 (!) 142/89    Wt Readings from Last 3 Encounters:  03/24/18 194 lb 8 oz (88.2 kg)  03/06/18 193 lb 12.8 oz (87.9 kg)  01/06/18 192 lb (87.1 kg)    Physical Exam  Constitutional: She is oriented to person, place, and time. No distress.  HENT:  Mouth/Throat: Oropharynx is  clear and moist. No oropharyngeal exudate.  Eyes: Conjunctivae are normal. No scleral icterus.  Neck: Normal range of motion. Neck supple. No JVD present. No thyromegaly present.  Cardiovascular: Normal rate, regular rhythm and normal heart sounds. Exam reveals no gallop and no friction rub.  No murmur heard. Pulmonary/Chest: Effort normal and breath sounds normal. No respiratory distress. She has no wheezes. She has no rhonchi. She has no rales.  Abdominal: Soft. Bowel sounds are normal. She exhibits no mass. There is no hepatosplenomegaly. There  is no tenderness.  Musculoskeletal: Normal range of motion. She exhibits no edema, tenderness or deformity.  Lymphadenopathy:    She has no cervical adenopathy.  Neurological: She is alert and oriented to person, place, and time.  Skin: Skin is warm and dry. Rash noted. Rash is papular. Rash is not nodular, not vesicular and not urticarial. She is not diaphoretic. No pallor.  On the dorsum, proximal phalanx of the right index finger there is a confluent patch of scaly papules.  Over the right posterior torso there are scattered areas of scaly papules.  Vitals reviewed.   Lab Results  Component Value Date   WBC 5.7 03/24/2018   HGB 14.9 03/24/2018   HCT 44.3 03/24/2018   PLT 214.0 03/24/2018   GLUCOSE 98 03/24/2018   CHOL 151 03/24/2018   TRIG 102.0 03/24/2018   HDL 61.70 03/24/2018   LDLDIRECT 132.2 06/10/2012   LDLCALC 68 03/24/2018   ALT 33 03/24/2018   AST 23 03/24/2018   NA 141 03/24/2018   K 3.9 03/24/2018   CL 102 03/24/2018   CREATININE 0.66 03/24/2018   BUN 9 03/24/2018   CO2 29 03/24/2018   TSH 3.38 03/24/2018   HGBA1C 5.9 12/26/2006    Ct Soft Tissue Neck W Contrast  Result Date: 09/17/2017 CLINICAL DATA:  Neurologic dysphonia EXAM: CT NECK WITH CONTRAST TECHNIQUE: Multidetector CT imaging of the neck was performed using the standard protocol following the bolus administration of intravenous contrast. Creatinine was obtained on site at [facility] at [address] Results: Creatinine 0.7 mg/dL. CONTRAST:  Reference EMR COMPARISON:  03/10/2015 FINDINGS: Pharynx and larynx: There is a gas filled structure in the right paraglottic fat consistent with laryngocele, neck not resolved. At time of imaging the sac measured 12 mm. There is a smooth submucosal soft tissue density nodule symmetrically on the left that is stable. There is mild supraglottic airway distortion, greater on the right. Salivary glands: No inflammation, mass, or stone. Thyroid: 5 mm left  thyroid nodule, incidental at this small size. Lymph nodes: None enlarged or abnormal density Vascular: Atherosclerotic calcification of the aorta and cervical carotids. Limited intracranial: Negative Visualized orbits: Bilateral cataract resection. Mastoids and visualized paranasal sinuses: Minor mucosal thickening on the floors of the maxillary sinuses. Secretions present in the right maxillary sinus. Skeleton: No acute or aggressive finding cervical facet arthropathy with multilevel ankylosis. Thoracic scoliosis. Upper chest: Biapical pleural scarring. IMPRESSION: 1. 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 2. Symmetric soft tissue density mass in the left paraglottic fat that favors opacified internal laryngocele, stable from 2016. Electronically Signed   By: Monte Fantasia M.D.   On: 09/17/2017 14:54    Assessment & Plan:   Latecia was seen today for hypertension, hyperlipidemia and rash.  Diagnoses and all orders for this visit:  Essential hypertension- She clearly has a whitecoat phenomenon.  She is not willing to add another antihypertensive at  this time.  Her labs are negative for secondary causes or endorgan damage.  She has a history of hypokalemia so will continue the potassium sparing diuretic. -     CBC with Differential/Platelet; Future -     TSH; Future -     Comprehensive metabolic panel; Future -     spironolactone (ALDACTONE) 25 MG tablet; Take 1 tablet (25 mg total) by mouth daily. -     Urinalysis, Routine w reflex microscopic; Future  Hyperlipidemia with target LDL less than 130- She has achieved her LDL goal and is doing well on the statin. -     Lipid panel; Future -     TSH; Future -     Comprehensive metabolic panel; Future -     rosuvastatin (CRESTOR) 10 MG tablet; Take 1 tablet (10 mg total) by mouth daily.  Vitamin D deficiency- Improvement noted.  Will continue the current vitamin D supplement. -      VITAMIN D 25 Hydroxy (Vit-D Deficiency, Fractures); Future -     rosuvastatin (CRESTOR) 10 MG tablet; Take 1 tablet (10 mg total) by mouth daily. -     Cholecalciferol 2000 units TABS; Take 1 tablet (2,000 Units total) by mouth daily.  Hypokalemia - Her potassium level is normal now. -     spironolactone (ALDACTONE) 25 MG tablet; Take 1 tablet (25 mg total) by mouth daily.  Other osteoporosis, unspecified pathological fracture presence -     Cholecalciferol 2000 units TABS; Take 1 tablet (2,000 Units total) by mouth daily.  Need for influenza vaccination -     Flu vaccine HIGH DOSE PF (Fluzone High dose)  Eczema, unspecified type -     Crisaborole (EUCRISA) 2 % OINT; Apply 1 Act topically 2 (two) times daily.   I have changed Katie Stout. Katie Stout spironolactone. I am also having her start on Crisaborole. Additionally, I am having her maintain her calcium carbonate, fexofenadine, hyoscyamine, fluticasone, azelastine, ALPRAZolam, rosuvastatin, and Cholecalciferol. We will continue to administer botulinum toxin Type A.  Meds ordered this encounter  Medications  . spironolactone (ALDACTONE) 25 MG tablet    Sig: Take 1 tablet (25 mg total) by mouth daily.    Dispense:  90 tablet    Refill:  1  . rosuvastatin (CRESTOR) 10 MG tablet    Sig: Take 1 tablet (10 mg total) by mouth daily.    Dispense:  90 tablet    Refill:  1    D/C last RX  . Cholecalciferol 2000 units TABS    Sig: Take 1 tablet (2,000 Units total) by mouth daily.    Dispense:  90 tablet    Refill:  1  . Crisaborole (EUCRISA) 2 % OINT    Sig: Apply 1 Act topically 2 (two) times daily.    Dispense:  60 g    Refill:  5     Follow-up: Return in about 6 months (around 09/22/2018).  [name], MD

## 2018-03-24 NOTE — Patient Instructions (Signed)

## 2018-03-27 ENCOUNTER — Telehealth: Payer: Self-pay | Admitting: Neurology

## 2018-03-27 ENCOUNTER — Encounter

## 2018-03-27 ENCOUNTER — Ambulatory Visit (INDEPENDENT_AMBULATORY_CARE_PROVIDER_SITE_OTHER): Payer: Medicare Other | Admitting: Neurology

## 2018-03-27 DIAGNOSIS — G243 Spasmodic torticollis: Secondary | ICD-10-CM | POA: Diagnosis not present

## 2018-03-27 MED ORDER — ONABOTULINUMTOXINA 100 UNITS IJ SOLR
250.0000 [IU] | Freq: Once | INTRAMUSCULAR | Status: AC
  Administered 2018-03-27: 250 [IU] via INTRAMUSCULAR

## 2018-03-27 NOTE — Telephone Encounter (Signed)
Referral faxed to [facility] at [phone] with confirmation received. They will call patient directly to schedule.

## 2018-03-27 NOTE — Procedures (Signed)
[facility]   Procedure Note Botox  Attending: Dr. Wells Guiles Kimoni Pagliarulo  Preoperative Diagnosis(es): Cervical Dystonia  Result History  Onset of effect: unknown Clinical results:  Was doing so well at 3 months that she cx appt.  Pain increased over the last 6 weeks. Adverse Effects: n/a (reiterates that she doesn't want levator scapulae done due to prior dysphagia)  Consent obtained from: The patient Benefits discussed included, but were not limited to decreased muscle tightness, increased joint range of motion, and decreased pain.  Risk discussed included, but were not limited pain and discomfort, bleeding, bruising, excessive weakness, venous thrombosis, muscle atrophy and dysphagia.  A copy of the patient medication guide was given to the patient which explains the blackbox warning.  Patients identity and treatment sites confirmed Yes.  .  Details of Procedure: Skin was cleaned with alcohol.  A 30 gauge, 1/2 inch needle was introduced to the target muscle (except splenius capitus, posterior approach, where 27 inch, 1 1/2 gauge needle was used).  Prior to injection, the needle plunger was aspirated to make sure the needle was not within a blood vessel.  There was no blood retrieved on aspiration.    Following is a summary of the muscles injected  And the amount of Botulinum toxin used:   Dilution 0.9% preservative free saline mixed with 100 u Botox type A to make 10 U per 0.1cc  Injections  Location Left  Right Units Number of sites        Sternocleidomastoid 50/20  70 2  Splenius Capitus, posterior approach  80 80 1  Splenius Capitus, lateral approach  40 40 1  Levator Scapulae      Trapezius 20/20/10  50 3        TOTAL UNITS:   240    Agent: Botulinum Type A ( Onobotulinum Toxin type A ).  2 vials of Botox were used, each containing 100 units and freshly diluted with 1 mL of sterile, non-preserved saline   Total injected (Units): 240  Total wasted (Units): 10   Pt  tolerated procedure well without complications.   Reinjection is anticipated in 3 months.

## 2018-04-01 ENCOUNTER — Telehealth: Payer: Self-pay | Admitting: Neurology

## 2018-04-01 NOTE — Telephone Encounter (Signed)
Mychart message sent to patient.

## 2018-04-07 ENCOUNTER — Ambulatory Visit: Payer: Medicare Other | Admitting: Physical Medicine & Rehabilitation

## 2018-04-17 ENCOUNTER — Ambulatory Visit: Payer: Medicare Other | Admitting: Physical Medicine & Rehabilitation

## 2018-04-28 ENCOUNTER — Encounter: Payer: Medicare Other | Attending: Physical Medicine & Rehabilitation

## 2018-04-28 ENCOUNTER — Ambulatory Visit (HOSPITAL_BASED_OUTPATIENT_CLINIC_OR_DEPARTMENT_OTHER): Payer: Medicare Other | Admitting: Physical Medicine & Rehabilitation

## 2018-04-28 ENCOUNTER — Encounter: Payer: Self-pay | Admitting: Physical Medicine & Rehabilitation

## 2018-04-28 VITALS — BP 175/72 | HR 109 | Resp 14 | Ht 64.5 in | Wt 194.0 lb

## 2018-04-28 DIAGNOSIS — M25512 Pain in left shoulder: Secondary | ICD-10-CM | POA: Diagnosis not present

## 2018-04-28 DIAGNOSIS — M7522 Bicipital tendinitis, left shoulder: Secondary | ICD-10-CM | POA: Diagnosis not present

## 2018-04-28 NOTE — Progress Notes (Signed)
Subjective:    Patient ID: Katie Stout, female    DOB: Apr 15, 1946, 72 y.o.   MRN: [id number]  HPI  90% improvement with bicipital tendon sheath, effect was ~42mo Takes Aleve about 2 tablets per day Rolling sensation on 3 occasions, this involved her arm on on occasion and her forearm on two occasions, states it was not really painful.  She has a hx of ORIF Left proximal forearm performed by Dr Noemi Chapel and plans to follow up with him.  Started doing achilles stretches  Scheduled for laryngeal surgery for removal of amyloid deposits  Still complains primarily of Left shoulder pain Pain Inventory Average Pain 6 Pain Right Now 5 My pain is constant, dull and aching  In the last 24 hours, has pain interfered with the following? General activity 6 Relation with others 4 Enjoyment of life 5 What TIME of day is your pain at its worst? daytime Sleep (in general) Fair  Pain is worse with: na Pain improves with: rest, heat/ice and medication Relief from Meds: 4  Mobility walk without assistance how many minutes can you walk? 10 ability to climb steps?  yes do you drive?  yes  Function retired  Neuro/Psych tingling spasms anxiety  Prior Studies Any changes since last visit?  no  Physicians involved in your care Primary care Dr Parks Ranger   Family History  Problem Relation Age of Onset  . Coronary artery disease Mother   . Heart attack Mother   . Breast cancer Mother   . Colon cancer Mother   . Hypertension Father   . Thrombocytopenia Father   . Other Father        ITP  . Prostate cancer Brother   . AAA (abdominal aortic aneurysm) Brother    Social History   Socioeconomic History  . Marital status: Widowed    Spouse name: Not on file  . Number of children: 1  . Years of education: Not on file  . Highest education level: Not on file  Occupational History  . Occupation: retired  Scientific laboratory technician  . Financial resource strain: Not on file  . Food  insecurity:    Worry: Not on file    Inability: Not on file  . Transportation needs:    Medical: Not on file    Non-medical: Not on file  Tobacco Use  . Smoking status: Never Smoker  . Smokeless tobacco: Never Used  Substance and Sexual Activity  . Alcohol use: No    Alcohol/week: 0.0 standard drinks  . Drug use: No  . Sexual activity: Not on file  Lifestyle  . Physical activity:    Days per week: Not on file    Minutes per session: Not on file  . Stress: Not on file  Relationships  . Social connections:    Talks on phone: Not on file    Gets together: Not on file    Attends religious service: Not on file    Active member of club or organization: Not on file    Attends meetings of clubs or organizations: Not on file    Relationship status: Not on file  Other Topics Concern  . Not on file  Social History Narrative   HSG   Married '68-22 years; Divorced   1 Daughter - '85; no grandchildren   Work: PT for [employer]   Lives: Alone, but adjoins daughter   Past Surgical History:  Procedure Laterality Date  . [place]  complete  . CATARACT EXTRACTION, BILATERAL  2007  . CESAREAN SECTION    . COLONOSCOPY  02/10/2013  . ESOPHAGOGASTRODUODENOSCOPY  02/10/2013  . ESOPHAGOSCOPY  04/14/2012   Procedure: ESOPHAGOSCOPY;  Surgeon: Rozetta Nunnery, MD;  Location: [place];  Service: ENT;  Laterality: Right;  . LARYNX SURGERY  11/2011   vocal cord biopsy  . NASAL HEMORRHAGE CONTROL  04/14/2012   Procedure: EPISTAXIS CONTROL;  Surgeon: Rozetta Nunnery, MD;  Location: [place];  Service: ENT;  Laterality: Right;  Cauterization of Right Septal Vessel  . OOPHORECTOMY  1992   x 1  . ORIF ULNAR FRACTURE  01/16/2000   left: also radial head dislocation  . 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 Anxiety   . Complication of anesthesia     states had flashing lights x 6 wks. post-op in 1992  . Dental crowns present   . Difficulty swallowing solids    states difficulty swallowing dry foods  . Diverticulosis of colon (without mention of hemorrhage)   . Eczema   . GERD (gastroesophageal reflux disease)   . H. pylori infection   . Hiatal hernia    states "small"  . History of kidney stones   . Hypertension    under control, has been on med. x 7 yrs.  . 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 Other and unspecified hyperlipidemia   . Palpitations    "all my life" - states no problems; states had stress test 2006; no cardiologist  . Post-nasal drip 04/10/2012  . Rash    breasts  . Spasmodic torticollis    has difficulty turning head to left side; receives Botox injections   BP (!) 175/72   Pulse (!) 109   Resp 14   Ht 5' 4.5" (1.638 m)   Wt 194 lb (88 kg)   SpO2 94%   BMI 32.79 kg/m   Opioid Risk Score:   Fall Risk Score:  `1  Depression screen PHQ 2/9  Depression screen [facility] 2/9 03/04/2017 10/23/2015 05/11/2014 02/25/2014  Decreased Interest 3 0 0 1  Down, Depressed, Hopeless 0 0 0 1  PHQ - 2 Score 3 0 0 2  Altered sleeping 2 - - 0  Tired, decreased energy 2 - - 1  Change in appetite 2 - - 0  Feeling bad or failure about yourself  0 - - 0  Trouble concentrating 0 - - 0  Moving slowly or fidgety/restless 0 - - -  Suicidal thoughts 0 - - 0  PHQ-9 Score 9 - - 3  Difficult doing work/chores Somewhat difficult - - -    Review of Systems  Constitutional: Negative.   HENT: Negative.   Eyes: Negative.   Respiratory: Negative.   Cardiovascular: Negative.   Gastrointestinal: Negative.   Endocrine: Negative.   Genitourinary: Negative.   Musculoskeletal: Positive for arthralgias and myalgias.       Spasms   Skin: Positive for rash.  Allergic/Immunologic: Negative.   Neurological: Negative.        Tingling  Psychiatric/Behavioral: The patient is nervous/anxious.   All other systems reviewed and are  negative.      Objective:   Physical Exam  Constitutional: She is oriented to person, place, and time. She appears well-developed and well-nourished. No distress.  HENT:  Head: Normocephalic and atraumatic.  Eyes: Pupils are equal, round, and reactive to light.  EOM are normal.  Musculoskeletal: Normal range of motion.  Neurological: She is alert and oriented to person, place, and time.  Skin: Skin is warm. She is not diaphoretic.  Psychiatric: She has a normal mood and affect.  Nursing note and vitals reviewed. Motor strength is 4+ in the left deltoid bicep tricep grip.  She does have decreased endrange abduction and forward flexion of the left shoulder compared to the right side. She has normal elbow flexion extension as well as finger flexion extension and wrist rest flexion extension as well as pronation supination. She has no tenderness palpation or swelling in the arm or forearm area.  No erythema. Well-healed incision over the left proximal radial area       Assessment & Plan:  #1.  Chronic left shoulder pain, she does have evidence of tenosynovitis in the bicipital tendon on ultrasound and patient responded for about 1 month to injection.  We discussed that at her earliest we can reinject her in December.  She will think about it. She plans to follow-up with her orthopedic surgeon in regards to her left forearm pain In the meantime she will trial some Aspercreme to the left shoulder forearm area She has some upcoming laryngeal surgery that has been her focus lately.

## 2018-04-28 NOTE — Patient Instructions (Signed)
Try aspercreme for Left shoulder and left elbow pain

## 2018-04-29 DIAGNOSIS — L299 Pruritus, unspecified: Secondary | ICD-10-CM | POA: Diagnosis not present

## 2018-04-29 DIAGNOSIS — L304 Erythema intertrigo: Secondary | ICD-10-CM | POA: Diagnosis not present

## 2018-04-29 DIAGNOSIS — B351 Tinea unguium: Secondary | ICD-10-CM | POA: Diagnosis not present

## 2018-04-29 DIAGNOSIS — D229 Melanocytic nevi, unspecified: Secondary | ICD-10-CM | POA: Diagnosis not present

## 2018-05-04 DIAGNOSIS — M7582 Other shoulder lesions, left shoulder: Secondary | ICD-10-CM | POA: Diagnosis not present

## 2018-05-04 DIAGNOSIS — M7712 Lateral epicondylitis, left elbow: Secondary | ICD-10-CM | POA: Diagnosis not present

## 2018-05-04 DIAGNOSIS — M722 Plantar fascial fibromatosis: Secondary | ICD-10-CM | POA: Diagnosis not present

## 2018-05-10 IMAGING — MR MR CERVICAL SPINE W/O CM
4 of 5 series · 27 of 48 positions shown · non-contrast
Comparison: CT scan dated 03/10/2015 power radiographs dated
02/19/2014 and MRI dated 09/27/2014

CLINICAL DATA: Chronic neck pain. Right shoulder and neck pain have
progressed over the last 6-8 months.

EXAM:
MRI CERVICAL SPINE WITHOUT CONTRAST
TECHNIQUE: Multiplanar, multisequence MR imaging of the cervical spine was
performed. No intravenous contrast was administered.

[Series 6: T2 · sagittal · 3.0mm · 0.55mm/px · 6 of 15 slices shown (1 of 2)]
[im 1/15]
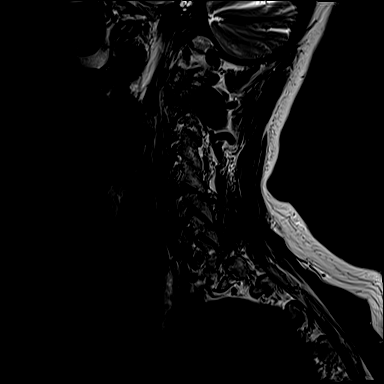
[im 3/15]
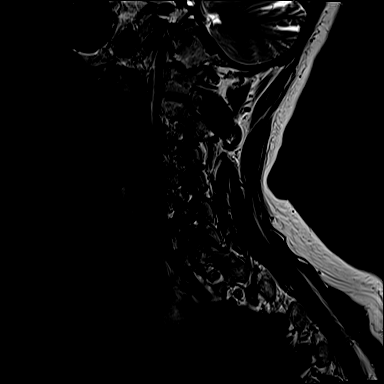
[im 6/15]
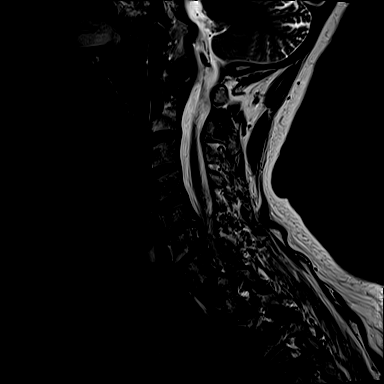
[im 9/15]
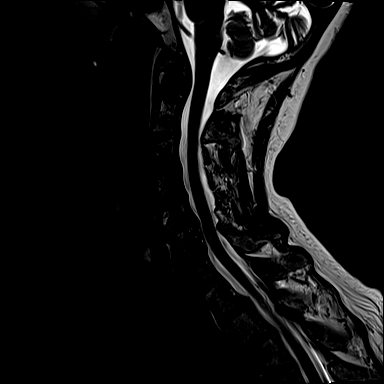
[im 12/15]
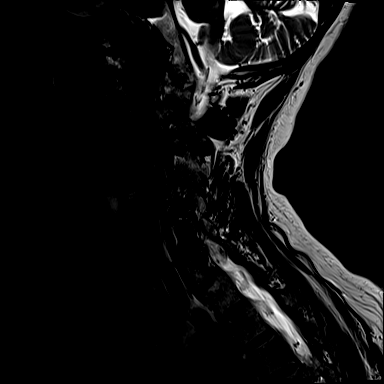
[im 15/15]
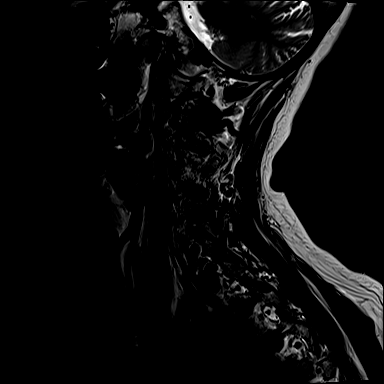

[Series 7: T1 · sagittal · 3.0mm · 0.66mm/px · 7 of 15 slices shown]
[im 1/15]
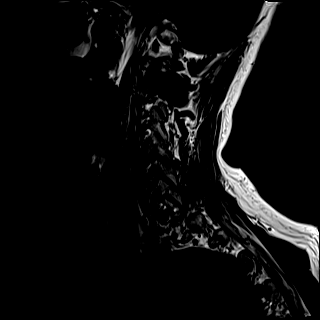
[im 3/15]
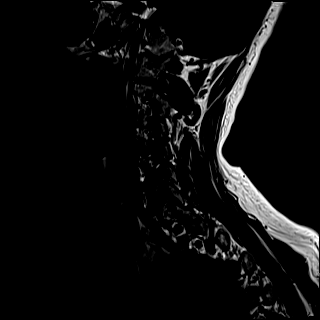
[im 5/15]
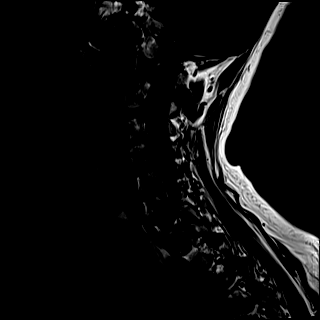
[im 8/15]
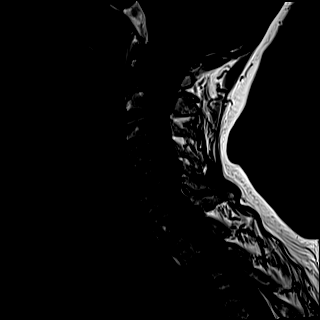
[im 10/15]
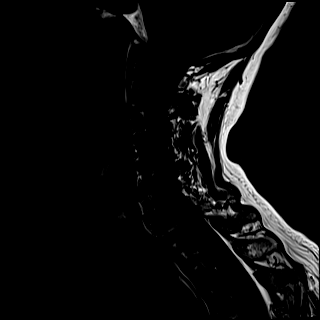
[im 12/15]
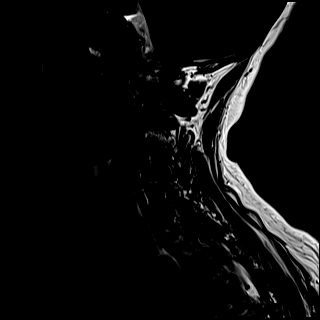
[im 15/15]
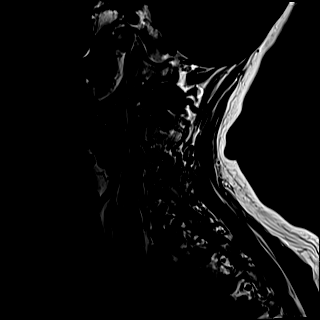

[Series 8: STIR · sagittal · 3.0mm · 0.33mm/px · 6 of 15 slices shown]
[im 1/15]
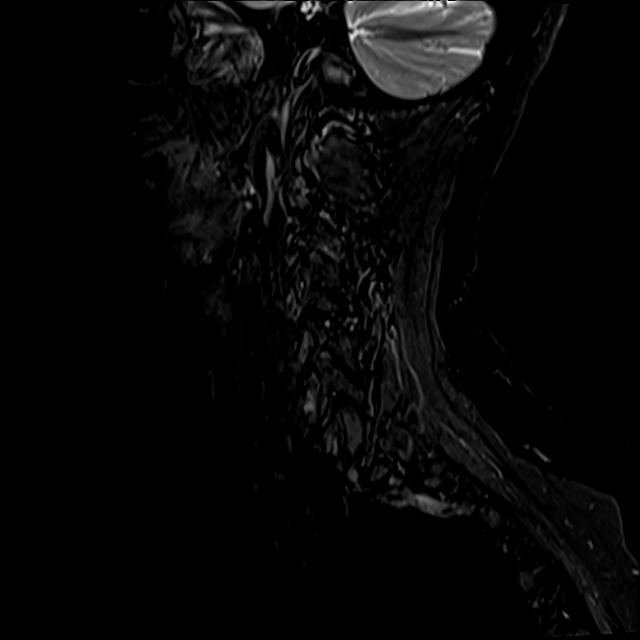
[im 3/15]
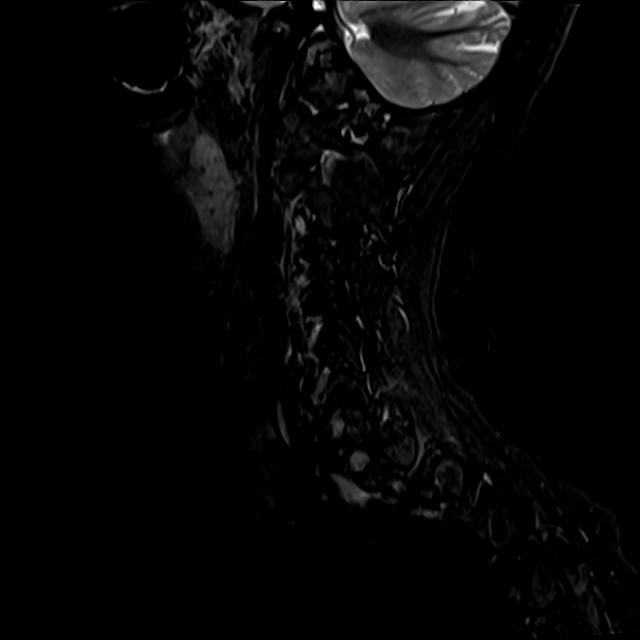
[im 5/15]
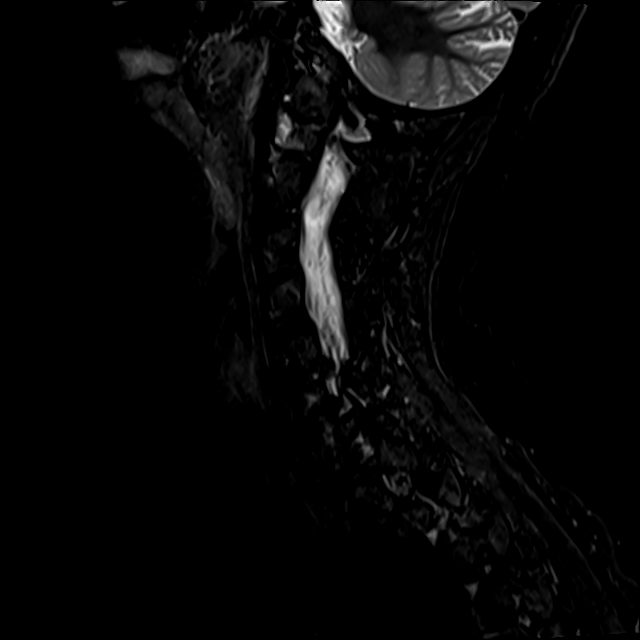
[im 8/15]
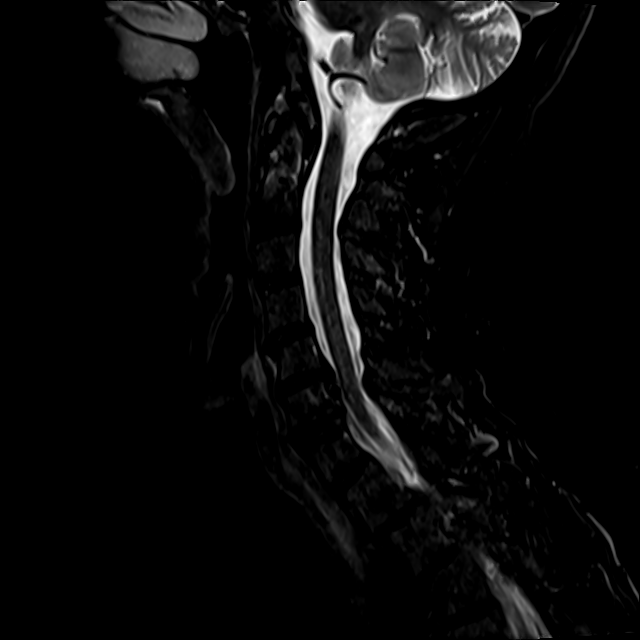
[im 10/15]
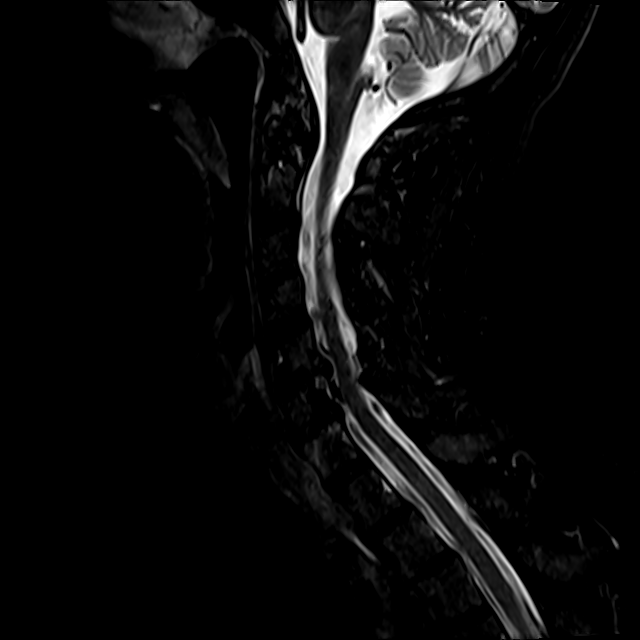
[im 12/15]
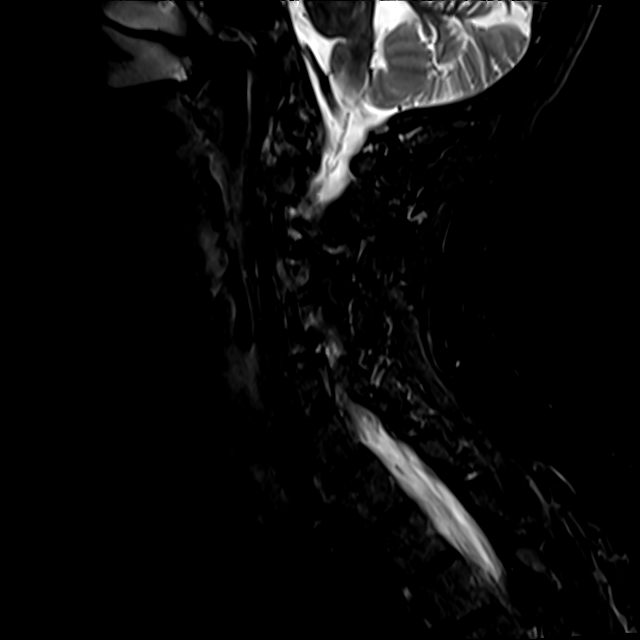

[Series 9: T2 · axial · 3.0mm · 0.50mm/px · z∈[-81,+12]mm · 8 of 30 slices shown (2 of 2)]
[im 1/30]
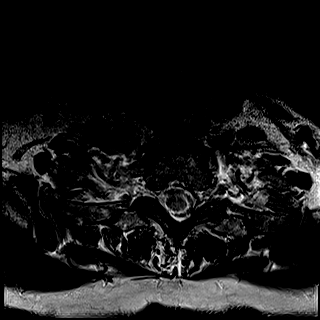
[im 5/30]
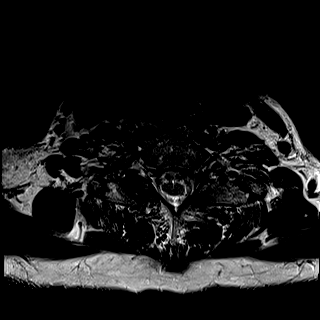
[im 9/30]
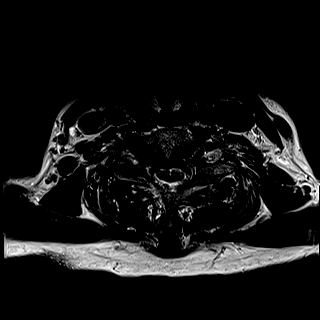
[im 14/30]
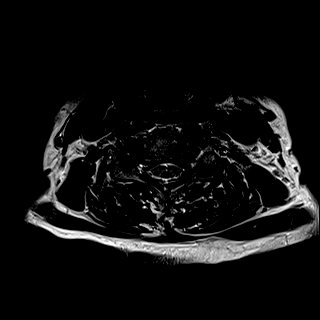
[im 16/30]
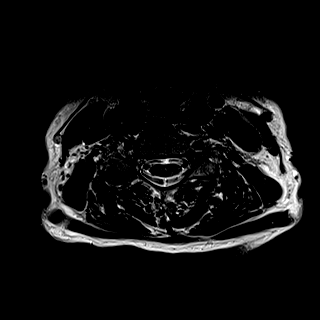
[im 21/30]
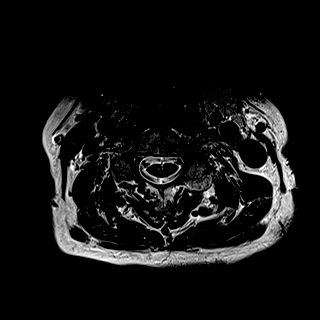
[im 25/30]
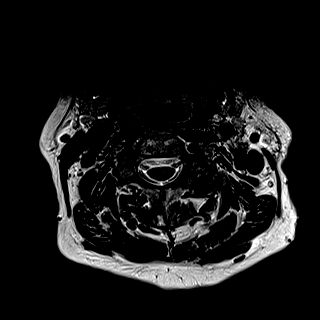
[im 30/30]
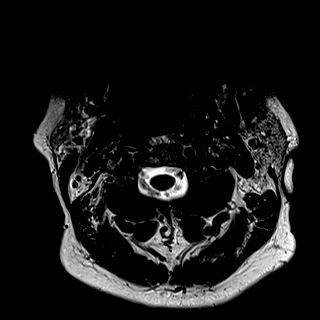

[27 of 48 positions shown; findings below may reference images not displayed]

FINDINGS: Alignment: Chronic 2 mm retrolisthesis of C5 on C6.

Vertebrae: No fractures or bone destruction. Prominent cyst in the
posterior aspect of C6 to the left of midline containing gas.

Cord: Normal.

Posterior Fossa, vertebral arteries, paraspinal tissues: Normal.

Disc levels:

Craniocervical junction through C2-3: Severe left facet arthritis at
C2-3. Normal disc at C2-3. Moderate chronic degenerative changes
between the anterior arch of C1 and the odontoid process.
Degenerative changes of the right lateral mass of C1.

C3-4: Auto fusion of the facet joints.  The disc is normal.

C4-5: Normal disc. Moderate left facet arthritis and slight right
facet arthritis. No foraminal stenosis.

C5-6: Chronic degenerative disc disease with a small broad-based
disc osteophyte complex which extends into both lateral recesses
without severe foraminal stenosis.

C6-7: Chronic degenerative disc disease with a vacuum phenomenon
with gas in the degenerative cyst in the posterior aspect of C6.
This has progressed since the prior CT scan. Uncinate spurs narrow
the right neural foramen.

C7-T1:  Slight left facet arthritis.  Normal disc.
IMPRESSION: 1. Severe left facet arthritis at C2-3.
2. Chronic degenerative disc disease primarily at C5-6 and C6-7 with
narrowing of both lateral recesses at C5-6 which could affect the C6
nerves.
3. Moderate degenerative changes between the anterior arch of C1 and
the odontoid process.

## 2018-05-15 ENCOUNTER — Encounter: Payer: Self-pay | Admitting: Internal Medicine

## 2018-05-15 ENCOUNTER — Ambulatory Visit (INDEPENDENT_AMBULATORY_CARE_PROVIDER_SITE_OTHER): Payer: Medicare Other | Admitting: Internal Medicine

## 2018-05-15 VITALS — BP 160/82 | HR 106 | Temp 99.1°F | Resp 16 | Ht 64.5 in | Wt 194.0 lb

## 2018-05-15 DIAGNOSIS — R42 Dizziness and giddiness: Secondary | ICD-10-CM

## 2018-05-15 DIAGNOSIS — R1031 Right lower quadrant pain: Secondary | ICD-10-CM | POA: Diagnosis not present

## 2018-05-15 DIAGNOSIS — J0120 Acute ethmoidal sinusitis, unspecified: Secondary | ICD-10-CM | POA: Diagnosis not present

## 2018-05-15 MED ORDER — AZITHROMYCIN 250 MG PO TABS: ORAL_TABLET | ORAL | 0 refills | Status: DC

## 2018-05-15 NOTE — Progress Notes (Signed)
Subjective:    Patient ID: Katie Stout, female    DOB: 1946/06/13, 72 y.o.   MRN: [id number]  HPI She is here for an acute visit for cold symptoms.  Her symptoms started 4 days ago.  She is experiencing chills, mucus in throat, nasal congestion, postnasal drip, headaches, sinus pressure over her eyes, pressure in her ears and have not been able to pop, ear discomfort, dizziness.  She has taken sudafed yesterday.  She took meclizine for the dizziness.    RLQ/groin pain: In 1993 she had TAH.  Has had shooting, stabbing pain in RLQ intermittently for the past 5 years. Usually happens 1-2 times a year only and she was told it was scar tissue. It occurred 3 times today.  She was concerned that it has occurred so much and usually is rare. She did have a lot of intestinal gas yesterday.  The pain is transient and she does not have pain now.      Medications and allergies reviewed with patient and updated if appropriate.  Patient Active Problem List   Diagnosis Date Noted  . Eczema 03/24/2018  . Vitamin D deficiency 09/29/2017  . Chronic neck pain 03/04/2017  . Myofascial muscle pain 03/04/2017  . Subacromial impingement of left shoulder 03/04/2017  . Arthralgia of left acromioclavicular joint 03/04/2017  . Systolic murmur 52/77/8242  . Degenerative disc disease, cervical 01/10/2016  . Insomnia 10/27/2014  . Iron deficiency anemia 06/27/2011  . Routine general medical examination at a health care facility 01/02/2011  . 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 Spastic torticollis 05/24/2010  . Allergic rhinitis 12/29/2009  . Osteoporosis 03/28/2008  . Hyperlipidemia with target LDL less than 130 01/24/2007  . Depression 01/24/2007  . Essential hypertension 01/24/2007  . GERD 01/24/2007    Current Outpatient Medications on File Prior to Visit  Medication Sig Dispense Refill  . ALPRAZolam (XANAX) 0.25 MG tablet Take 1 tablet (0.25 mg total) by mouth at bedtime as needed for sleep. 30  tablet 3  . azelastine (OPTIVAR) 0.05 % ophthalmic solution Place 1 drop into both eyes as needed.    . calcium carbonate (TUMS CALCIUM FOR LIFE BONE) 750 MG chewable tablet Chew 2-4 tablets by mouth daily.      . Cholecalciferol 2000 units TABS Take 1 tablet (2,000 Units total) by mouth daily. 90 tablet 1  . Crisaborole (EUCRISA) 2 % OINT Apply 1 Act topically 2 (two) times daily. 60 g 5  . fexofenadine (ALLEGRA) 30 MG tablet Take 30 mg by mouth 2 (two) times daily.    . fluticasone (FLONASE) 50 MCG/ACT nasal spray Place into both nostrils daily.    . hyoscyamine (LEVSIN SL) 0.125 MG SL tablet Place 1 tablet (0.125 mg total) under the tongue every 4 (four) hours as needed for cramping. 30 tablet 4  . rosuvastatin (CRESTOR) 10 MG tablet Take 1 tablet (10 mg total) by mouth daily. 90 tablet 1  . spironolactone (ALDACTONE) 25 MG tablet Take 1 tablet (25 mg total) by mouth daily. 90 tablet 1   No current facility-administered medications on file prior to visit.     Past Medical History:  Diagnosis Date  . Anxiety   . Complication of anesthesia    states had flashing lights x 6 wks. post-op in 1992  . Dental crowns present   . Difficulty swallowing solids    states difficulty swallowing dry foods  . Diverticulosis of colon (without mention of hemorrhage)   . Eczema   .  GERD (gastroesophageal reflux disease)   . H. pylori infection   . Hiatal hernia    states "small"  . History of kidney stones   . Hypertension    under control, has been on med. x 7 yrs.  . 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 Other and unspecified hyperlipidemia   . Palpitations    "all my life" - states no problems; states had stress test 2006; no cardiologist  . Post-nasal drip 04/10/2012  . Rash    breasts  . Spasmodic torticollis    has difficulty turning head to left side; receives Botox injections    Past Surgical History:  Procedure Laterality Date  . ABDOMINAL HYSTERECTOMY  1993   complete    . CATARACT EXTRACTION, BILATERAL  2007  . CESAREAN SECTION    . COLONOSCOPY  02/10/2013  . ESOPHAGOGASTRODUODENOSCOPY  02/10/2013  . ESOPHAGOSCOPY  04/14/2012   Procedure: ESOPHAGOSCOPY;  Surgeon: Rozetta Nunnery, MD;  Location: [place];  Service: ENT;  Laterality: Right;  . LARYNX SURGERY  11/2011   vocal cord biopsy  . NASAL HEMORRHAGE CONTROL  04/14/2012   Procedure: EPISTAXIS CONTROL;  Surgeon: Rozetta Nunnery, MD;  Location: [place];  Service: ENT;  Laterality: Right;  Cauterization of Right Septal Vessel  . OOPHORECTOMY  1992   x 1  . ORIF ULNAR FRACTURE  01/16/2000   left: also radial head dislocation  . TRANSPHENOIDAL / TRANSNASAL HYPOPHYSECTOMY / RESECTION PITUITARY TUMOR  1983    Social History   Socioeconomic History  . Marital status: Widowed    Spouse name: Not on file  . Number of children: 1  . Years of education: Not on file  . Highest education level: Not on file  Occupational History  . Occupation: retired  Scientific laboratory technician  . Financial resource strain: Not on file  . Food insecurity:    Worry: Not on file    Inability: Not on file  . Transportation needs:    Medical: Not on file    Non-medical: Not on file  Tobacco Use  . Smoking status: Never Smoker  . Smokeless tobacco: Never Used  Substance and Sexual Activity  . Alcohol use: No    Alcohol/week: 0.0 standard drinks  . Drug use: No  . Sexual activity: Not on file  Lifestyle  . Physical activity:    Days per week: Not on file    Minutes per session: Not on file  . Stress: Not on file  Relationships  . Social connections:    Talks on phone: Not on file    Gets together: Not on file    Attends religious service: Not on file    Active member of club or organization: Not on file    Attends meetings of clubs or organizations: Not on file    Relationship status: Not on file  Other Topics Concern  . Not on file  Social History Narrative   HSG   Married '68-22  years; Divorced   1 Daughter - '85; no grandchildren   Work: PT for [employer]   Lives: Alone, but adjoins daughter    Family History  Problem Relation Age of Onset  . Coronary artery disease Mother   . Heart attack Mother   . Breast cancer Mother   . Colon cancer Mother   . Hypertension Father   . Thrombocytopenia Father   . Other Father        ITP  .  Prostate cancer Brother   . AAA (abdominal aortic aneurysm) Brother     Review of Systems  Constitutional: Positive for chills. Negative for fever.  HENT: Positive for congestion, ear pain (pressure in ears - unable to pop), postnasal drip and sinus pressure (frontal sinus pain/pressure). Negative for sore throat (scratchy throat).   Respiratory: Negative for cough, shortness of breath and wheezing.   Neurological: Positive for dizziness and headaches.       Objective:   Vitals:   05/15/18 1429  BP: (!) 160/82  Pulse: (!) 106  Resp: 16  Temp: 99.1 F (37.3 C)  SpO2: 97%   Filed Weights   05/15/18 1429  Weight: 194 lb (88 kg)   Body mass index is 32.79 kg/m.  Wt Readings from Last 3 Encounters:  05/15/18 194 lb (88 kg)  04/28/18 194 lb (88 kg)  03/24/18 194 lb 8 oz (88.2 kg)     Physical Exam GENERAL APPEARANCE: Appears stated age, well appearing, NAD EYES: conjunctiva clear, no icterus HEENT: bilateral tympanic membranes and ear canals normal, oropharynx with mild erythema, sinus tenderness with palpation, no thyromegaly, trachea midline, no cervical or supraclavicular lymphadenopathy LUNGS: Clear to auscultation without wheeze or crackles, unlabored breathing, good air entry bilaterally CARDIOVASCULAR: Normal S1,S2 without murmurs, no edema ABD: Soft, nondistended, no tenderness in the right lower quadrant or groin region SKIN: warm, dry        Assessment & Plan:   See Problem List for Assessment and Plan of chronic medical problems.

## 2018-05-15 NOTE — Patient Instructions (Addendum)
Start using the flonase and saline nasal spray.   Take the zpak as prescribed if your symptoms do not improve..  Take the bonine for the dizziness.   Call if no improvement    Sinusitis, Adult Sinusitis is soreness and inflammation of your sinuses. Sinuses are hollow spaces in the bones around your face. Your sinuses are located:  Around your eyes.  In the middle of your forehead.  Behind your nose.  In your cheekbones.  Your sinuses and nasal passages are lined with a stringy fluid (mucus). Mucus normally drains out of your sinuses. When your nasal tissues become inflamed or swollen, the mucus can become trapped or blocked so air cannot flow through your sinuses. This allows bacteria, viruses, and funguses to grow, which leads to infection. Sinusitis can develop quickly and last for 7?10 days (acute) or for more than 12 weeks (chronic). Sinusitis often develops after a cold. What are the causes? This condition is caused by anything that creates swelling in the sinuses or stops mucus from draining, including:  Allergies.  Asthma.  Bacterial or viral infection.  Abnormally shaped bones between the nasal passages.  Nasal growths that contain mucus (nasal polyps).  Narrow sinus openings.  Pollutants, such as chemicals or irritants in the air.  A foreign object stuck in the nose.  A fungal infection. This is rare.  What increases the risk? The following factors may make you more likely to develop this condition:  Having allergies or asthma.  Having had a recent cold or respiratory tract infection.  Having structural deformities or blockages in your nose or sinuses.  Having a weak immune system.  Doing a lot of swimming or diving.  Overusing nasal sprays.  Smoking.  What are the signs or symptoms? The main symptoms of this condition are pain and a feeling of pressure around the affected sinuses. Other symptoms include:  Upper  toothache.  Earache.  Headache.  Bad breath.  Decreased sense of smell and taste.  A cough that may get worse at night.  Fatigue.  Fever.  Thick drainage from your nose. The drainage is often green and it may contain pus (purulent).  Stuffy nose or congestion.  Postnasal drip. This is when extra mucus collects in the throat or back of the nose.  Swelling and warmth over the affected sinuses.  Sore throat.  Sensitivity to light.  How is this diagnosed? This condition is diagnosed based on symptoms, a medical history, and a physical exam. To find out if your condition is acute or chronic, your health care provider may:  Look in your nose for signs of nasal polyps.  Tap over the affected sinus to check for signs of infection.  View the inside of your sinuses using an imaging device that has a light attached (endoscope).  If your health care provider suspects that you have chronic sinusitis, you may also:  Be tested for allergies.  Have a sample of mucus taken from your nose (nasal culture) and checked for bacteria.  Have a mucus sample examined to see if your sinusitis is related to an allergy.  If your sinusitis does not respond to treatment and it lasts longer than 8 weeks, you may have an MRI or CT scan to check your sinuses. These scans also help to determine how severe your infection is. In rare cases, a bone biopsy may be done to rule out more serious types of fungal sinus disease. How is this treated? Treatment for sinusitis depends on  the cause and whether your condition is chronic or acute. If a virus is causing your sinusitis, your symptoms will go away on their own within 10 days. You may be given medicines to relieve your symptoms, including:  Topical nasal decongestants. They shrink swollen nasal passages and let mucus drain from your sinuses.  Antihistamines. These drugs block inflammation that is triggered by allergies. This can help to ease swelling in  your nose and sinuses.  Topical nasal corticosteroids. These are nasal sprays that ease inflammation and swelling in your nose and sinuses.  Nasal saline washes. These rinses can help to get rid of thick mucus in your nose.  If your condition is caused by bacteria, you will be given an antibiotic medicine. If your condition is caused by a fungus, you will be given an antifungal medicine. Surgery may be needed to correct underlying conditions, such as narrow nasal passages. Surgery may also be needed to remove polyps. Follow these instructions at home: Medicines  Take, use, or apply over-the-counter and prescription medicines only as told by your health care provider. These may include nasal sprays.  If you were prescribed an antibiotic medicine, take it as told by your health care provider. Do not stop taking the antibiotic even if you start to feel better. Hydrate and Humidify  Drink enough water to keep your urine clear or pale yellow. Staying hydrated will help to thin your mucus.  Use a cool mist humidifier to keep the humidity level in your home above 50%.  Inhale steam for 10-15 minutes, 3-4 times a day or as told by your health care provider. You can do this in the bathroom while a hot shower is running.  Limit your exposure to cool or dry air. Rest  Rest as much as possible.  Sleep with your head raised (elevated).  Make sure to get enough sleep each night. General instructions  Apply a warm, moist washcloth to your face 3-4 times a day or as told by your health care provider. This will help with discomfort.  Wash your hands often with soap and water to reduce your exposure to viruses and other germs. If soap and water are not available, use hand sanitizer.  Do not smoke. Avoid being around people who are smoking (secondhand smoke).  Keep all follow-up visits as told by your health care provider. This is important. Contact a health care provider if:  You have a  fever.  Your symptoms get worse.  Your symptoms do not improve within 10 days. Get help right away if:  You have a severe headache.  You have persistent vomiting.  You have pain or swelling around your face or eyes.  You have vision problems.  You develop confusion.  Your neck is stiff.  You have trouble breathing. This information is not intended to replace advice given to you by your health care provider. Make sure you discuss any questions you have with your health care provider. Document Released: 06/24/2005 Document Revised: 02/18/2016 Document Reviewed: 04/19/2015 Elsevier Interactive Patient Education  [employer].

## 2018-05-16 DIAGNOSIS — R1031 Right lower quadrant pain: Secondary | ICD-10-CM | POA: Insufficient documentation

## 2018-05-16 DIAGNOSIS — R42 Dizziness and giddiness: Secondary | ICD-10-CM | POA: Insufficient documentation

## 2018-05-16 DIAGNOSIS — J0120 Acute ethmoidal sinusitis, unspecified: Secondary | ICD-10-CM | POA: Insufficient documentation

## 2018-05-16 NOTE — Assessment & Plan Note (Signed)
Right lower quadrant/groin pain-has been intermittent for 5 years-typically only occurs 1-2 times a year Has occurred more frequently in the past 2 days Currently she does not have any pain there is no tenderness on exam Increased pain recently possibly related to intestinal gas She will monitor for now and follow-up if the pain continues to occur

## 2018-05-16 NOTE — Assessment & Plan Note (Signed)
Meclizine as needed.  

## 2018-05-16 NOTE — Assessment & Plan Note (Signed)
flonase and saline nasal spray.    Over-the-counter cold medication for symptom relief Increase rest and fluids Take the zpak as prescribed if your symptoms do not improve

## 2018-05-26 DIAGNOSIS — L309 Dermatitis, unspecified: Secondary | ICD-10-CM | POA: Diagnosis not present

## 2018-06-08 ENCOUNTER — Ambulatory Visit: Payer: Medicare Other | Admitting: Physical Medicine & Rehabilitation

## 2018-06-08 ENCOUNTER — Encounter: Payer: Medicare Other | Attending: Physical Medicine & Rehabilitation

## 2018-06-08 VITALS — BP 163/80 | HR 90 | Resp 14 | Ht 65.0 in | Wt 193.0 lb

## 2018-06-08 DIAGNOSIS — M25512 Pain in left shoulder: Secondary | ICD-10-CM | POA: Insufficient documentation

## 2018-06-08 DIAGNOSIS — M7522 Bicipital tendinitis, left shoulder: Secondary | ICD-10-CM | POA: Diagnosis not present

## 2018-06-08 NOTE — Progress Notes (Signed)
Bicipital tendon sheath injection  Indication biceps Tenosynovitis which does not respond to conservative care including medications and therapy and interferes with activity causing pain  Informed consent was obtained after describing risks and benefits of the procedure with the patient including bleeding bruising and infection he elects to proceed and has given written consent Patient placed in a supine position palm up with the hand at the side. Area was scant using 12 Hz linear transducer. Biceps tendon just distal to the groove was identified. Area prepped with Betadine. Needle track was anesthetize with 1% lidocaine x2 ML. The tendon sheath was entered using cross sectional imaging and injection with Celestone 6 mg per mL x0.5 mL as well as lidocaine 1% times one ML. Patient tolerated procedure well. Post procedure instructions given.  Increase fluid was seen surrounding the biceps tendon, also shallow groove was noted Return to clinic 3 month for followup / possible reinjection

## 2018-06-15 DIAGNOSIS — J310 Chronic rhinitis: Secondary | ICD-10-CM | POA: Diagnosis not present

## 2018-06-26 ENCOUNTER — Ambulatory Visit: Payer: Medicare Other | Admitting: Neurology

## 2018-07-10 ENCOUNTER — Ambulatory Visit (INDEPENDENT_AMBULATORY_CARE_PROVIDER_SITE_OTHER): Payer: Medicare Other | Admitting: Neurology

## 2018-07-10 DIAGNOSIS — G243 Spasmodic torticollis: Secondary | ICD-10-CM | POA: Diagnosis not present

## 2018-07-10 MED ORDER — ONABOTULINUMTOXINA 100 UNITS IJ SOLR
240.0000 [IU] | Freq: Once | INTRAMUSCULAR | Status: AC
  Administered 2018-07-10: 240 [IU] via INTRAMUSCULAR

## 2018-07-10 NOTE — Procedures (Signed)
[facility]   Procedure Note Botox  Attending: Dr. Wells Guiles Tat  Preoperative Diagnosis(es): Cervical Dystonia  Result History  Onset of effect: unknown Clinical results:  Didn't think that worked as well as previously Adverse Effects: had some dysphagia but not as bad as previous.  Lasted about 2 weeks.    Consent obtained from: The patient Benefits discussed included, but were not limited to decreased muscle tightness, increased joint range of motion, and decreased pain.  Risk discussed included, but were not limited pain and discomfort, bleeding, bruising, excessive weakness, venous thrombosis, muscle atrophy and dysphagia.  A copy of the patient medication guide was given to the patient which explains the blackbox warning.  Patients identity and treatment sites confirmed Yes.  .  Details of Procedure: Skin was cleaned with alcohol.  A 30 gauge, 1/2 inch needle was introduced to the target muscle (except splenius capitus, posterior approach, where 27 inch, 1 1/2 gauge needle was used).  Prior to injection, the needle plunger was aspirated to make sure the needle was not within a blood vessel.  There was no blood retrieved on aspiration.    Following is a summary of the muscles injected  And the amount of Botulinum toxin used:   Dilution 0.9% preservative free saline mixed with 100 u Botox type A to make 10 U per 0.1cc  Injections  Location Left  Right Units Number of sites        Sternocleidomastoid 70  70 1  Splenius Capitus, posterior approach  80 80 1  Splenius Capitus, lateral approach  40 40 1  Levator Scapulae      Trapezius 20/20/10  50 3        TOTAL UNITS:   240    Agent: Botulinum Type A ( Onobotulinum Toxin type A ).  2 vials of Botox were used, each containing 100 units and freshly diluted with 1 mL of sterile, non-preserved saline   Total injected (Units): 240  Total wasted (Units): 0   Pt tolerated procedure well without complications.   Reinjection  is anticipated in 3 months.

## 2018-08-20 DIAGNOSIS — Z1231 Encounter for screening mammogram for malignant neoplasm of breast: Secondary | ICD-10-CM | POA: Diagnosis not present

## 2018-08-20 DIAGNOSIS — Z90710 Acquired absence of both cervix and uterus: Secondary | ICD-10-CM | POA: Diagnosis not present

## 2018-08-20 DIAGNOSIS — Z803 Family history of malignant neoplasm of breast: Secondary | ICD-10-CM | POA: Diagnosis not present

## 2018-08-20 DIAGNOSIS — M8589 Other specified disorders of bone density and structure, multiple sites: Secondary | ICD-10-CM | POA: Diagnosis not present

## 2018-08-20 DIAGNOSIS — K589 Irritable bowel syndrome without diarrhea: Secondary | ICD-10-CM | POA: Diagnosis not present

## 2018-08-20 LAB — HM MAMMOGRAPHY

## 2018-08-20 LAB — HM DEXA SCAN

## 2018-08-27 ENCOUNTER — Ambulatory Visit (INDEPENDENT_AMBULATORY_CARE_PROVIDER_SITE_OTHER): Payer: Medicare Other | Admitting: Internal Medicine

## 2018-08-27 ENCOUNTER — Encounter: Payer: Self-pay | Admitting: Internal Medicine

## 2018-08-27 VITALS — BP 130/76 | HR 93 | Ht 64.0 in | Wt 193.0 lb

## 2018-08-27 DIAGNOSIS — Z800 Family history of malignant neoplasm of digestive organs: Secondary | ICD-10-CM | POA: Diagnosis not present

## 2018-08-27 DIAGNOSIS — R140 Abdominal distension (gaseous): Secondary | ICD-10-CM

## 2018-08-27 DIAGNOSIS — K219 Gastro-esophageal reflux disease without esophagitis: Secondary | ICD-10-CM

## 2018-08-27 DIAGNOSIS — K5900 Constipation, unspecified: Secondary | ICD-10-CM | POA: Diagnosis not present

## 2018-08-27 MED ORDER — PEG-KCL-NACL-NASULF-NA ASC-C 140 G PO SOLR: 1.0000 | ORAL | 0 refills | Status: DC

## 2018-08-27 MED ORDER — HYOSCYAMINE SULFATE 0.125 MG SL SUBL: 0.1250 mg | SUBLINGUAL_TABLET | SUBLINGUAL | 2 refills | Status: DC | PRN

## 2018-08-27 NOTE — Progress Notes (Signed)
Subjective:    Patient ID: Katie Stout, female    DOB: Jun 30, 1946, 73 y.o.   MRN: [id number]  HPI Katie Stout is a 73 year old female with a history of GERD with esophageal spasm, family history of colorectal cancer in her mother who is here for follow-up.  She was last seen nearly 4 years ago in May 2016 and she is here alone today.  She also has a history of amyloid of the vocal cord, hypertension, spastic torticollis, kidney stones.  She reports that she has been doing fairly well though she will still occasionally have issues with abdominal gas and bloating and belching.  Occasionally she will have spasm in her epigastrium and esophagus which can be quite painful.  She is using omeprazole but only as needed along with Levsin Tums and Gas-X.  Combination of these medications she feels works well to control her intermittent and recently rare symptoms.  She denies dysphagia and odynophagia.  Bowel movements have been slightly more constipated than prior for her.  She attributes the less frequent bowel movements to increase in abdominal gas bloating and some of her epigastric symptoms.  Stools are somewhat smaller volume though she does go daily.  No blood in her stool or melena.  She has been seen by cardiology and had a cardiac MRI negative for amyloid.  This MRI showed normal LV size with an EF of 64%.  Normal right ventricular size and function.  Mild aortic and tricuspid regurgitation.  Trivial mitral regurgitation.  No evidence for infiltrative cardiomyopathy or cardiac amyloidosis.  She had an EGD and colonoscopy in 2014  She does have a remote history of H. pylori dating back to 1993 but on subsequent upper endoscopies with biopsies H. pylori was negative.  Review of Systems As per HPI, otherwise negative  Current Medications, Allergies, Past Medical History, Past Surgical History, Family History and Social History were reviewed in [employer] record.       Objective:   Physical Exam BP 130/76   Pulse 93   Ht 5\' 4"  (1.626 m)   Wt 193 lb (87.5 kg)   BMI 33.13 kg/m  Gen: awake, alert, NAD HEENT: anicteric, op clear CV: RRR, no mrg Pulm: CTA b/l Abd: soft, NT/ND, +BS throughout Ext: no c/c/e Neuro: nonfocal  CBC    Component Value Date/Time   WBC 5.7 03/24/2018 1339   RBC 4.92 03/24/2018 1339   HGB 14.9 03/24/2018 1339   HCT 44.3 03/24/2018 1339   PLT 214.0 03/24/2018 1339   MCV 89.9 03/24/2018 1339   MCH 30.0 03/05/2015 2056   MCHC 33.6 03/24/2018 1339   RDW 14.6 03/24/2018 1339   LYMPHSABS 0.9 03/24/2018 1339   MONOABS 0.4 03/24/2018 1339   EOSABS 0.2 03/24/2018 1339   BASOSABS 0.0 03/24/2018 1339      Assessment & Plan:  73 year old female with a history of GERD with esophageal spasm, family history of colorectal cancer in her mother who is here for follow-up.  1.  Family history of colon cancer --screening colonoscopy due at this time.  Last colonoscopy August 2014.  We discussed the risk, benefits and alternatives and she is agreeable and wishes to proceed.  2.  Mild constipation --she has recently started Metamucil tablets but only 1/day.  I recommended she increase to 2 tablets/day and perhaps more depending on response.  Also needs to increase the water in her diet to help with constipation and also response to fiber supplementation.  3.  Epigastric pain/GERD --intermittently using PPI which we discussed together.  May benefit from daily use.  Okay for as needed Levsin and Gas-X.  Repeat upper endoscopy recommended to evaluate ongoing symptoms and the patient also requests upper endoscopy.  We discussed the risk, benefits and alternatives and she is agreeable and wishes to proceed

## 2018-08-27 NOTE — Patient Instructions (Addendum)
You have been scheduled for an endoscopy and colonoscopy. Please follow the written instructions given to you at your visit today. Please pick up your prep supplies at the pharmacy within the next 1-3 days. If you use inhalers (even only as needed), please bring them with you on the day of your procedure. Your physician has requested that you go to www.startemmi.com and enter the access code given to you at your visit today. This web site gives a general overview about your procedure. However, you should still follow specific instructions given to you by our office regarding your preparation for the procedure.  Continue Levsin as needed.  Increase metamucil to 2 tablets daily with plenty of water.  If you are age 35 or older, your body mass index should be between 23-30. Your Body mass index is 33.13 kg/m. If this is out of the aforementioned range listed, please consider follow up with your Primary Care Provider.  If you are age 40 or younger, your body mass index should be between 19-25. Your Body mass index is 33.13 kg/m. If this is out of the aformentioned range listed, please consider follow up with your Primary Care Provider.

## 2018-08-28 ENCOUNTER — Ambulatory Visit: Payer: Self-pay | Admitting: *Deleted

## 2018-08-28 ENCOUNTER — Telehealth: Payer: Self-pay | Admitting: Internal Medicine

## 2018-08-28 MED ORDER — OSELTAMIVIR PHOSPHATE 75 MG PO CAPS: 75.0000 mg | ORAL_CAPSULE | Freq: Two times a day (BID) | ORAL | 0 refills | Status: DC

## 2018-08-28 NOTE — Telephone Encounter (Signed)
CRM for notification. See Telephone encounter for: 08/28/18. Pt is 63 and spent the day caring for her little grandaughter who has been diagnosed with the flu. She is elderly and her daughter is very worried about her getting this flu. Wants a call in this evening if possible of Tamaflu to possible prevent. She did have the flu shot but the husband did too and got it anyway. [facility]  Call line [phone]   No to international   Reason for Disposition . [1] Influenza EXPOSURE (Close Contact) within last 48 hours (2 days) AND [2] exposed person is HIGH RISK (e.g., age > 99 years, pregnant, HIV+, chronic medical condition)  Answer Assessment - Initial Assessment Questions 1. TYPE of EXPOSURE: "How were you exposed?" (e.g., close contact, not a close contact)     Close contact- helping with sick grandchild 2. DATE of EXPOSURE: "When did the exposure occur?" (e.g., hour, days, weeks)     Grandchild exposed flu 3. PREGNANCY: "Is there any chance you are pregnant?" "When was your last menstrual period?"     n/a 4. HIGH RISK for COMPLICATIONS: "Do you have any heart or lung problems? Do you have a weakened immune system?" (e.g., CHF, COPD, asthma, HIV positive, chemotherapy, renal failure, diabetes mellitus, sickle cell anemia)     No- high blood pressure 5. SYMPTOMS: "Do you have any symptoms?" (e.g., cough, fever, sore throat, difficulty breathing).     No symptoms at this time  Protocols used: INFLUENZA EXPOSURE-A-AH

## 2018-08-28 NOTE — Telephone Encounter (Signed)
erx sent for tamiflu per PCP.

## 2018-08-28 NOTE — Telephone Encounter (Signed)
Copied from [place] [phone]. Topic: Quick Communication - See Telephone Encounter >> Aug 28, 2018  4:09 PM Loma [place] wrote: CRM for notification. See Telephone encounter for: 08/28/18. Pt is 56 and spent the day caring for her little grandaughter who has been diagnosed with the flu. She is elderly and her daughter is very worried about her getting this flu. Wants a call in this evening if possible of Tamaflu to possible prevent. She did have the flu shot but the husband did too and got it anyway. [facility]  Call line [phone]   No to international

## 2018-08-28 NOTE — Telephone Encounter (Signed)
See triage note.

## 2018-09-01 ENCOUNTER — Encounter: Payer: Self-pay | Admitting: Internal Medicine

## 2018-09-07 ENCOUNTER — Ambulatory Visit (HOSPITAL_BASED_OUTPATIENT_CLINIC_OR_DEPARTMENT_OTHER): Payer: Medicare Other | Admitting: Physical Medicine & Rehabilitation

## 2018-09-07 ENCOUNTER — Encounter: Payer: Self-pay | Admitting: Physical Medicine & Rehabilitation

## 2018-09-07 ENCOUNTER — Other Ambulatory Visit: Payer: Self-pay

## 2018-09-07 ENCOUNTER — Encounter: Payer: Medicare Other | Attending: Physical Medicine & Rehabilitation

## 2018-09-07 VITALS — BP 151/82 | HR 98 | Ht 64.5 in | Wt 194.0 lb

## 2018-09-07 DIAGNOSIS — M7522 Bicipital tendinitis, left shoulder: Secondary | ICD-10-CM | POA: Diagnosis not present

## 2018-09-07 DIAGNOSIS — M25512 Pain in left shoulder: Secondary | ICD-10-CM | POA: Diagnosis not present

## 2018-09-07 NOTE — Progress Notes (Signed)
Bicipital tendon sheath injection  Indication biceps Tenosynovitis which does not respond to conservative care including medications and therapy and interferes with activity causing pain  Informed consent was obtained after describing risks and benefits of the procedure with the patient including bleeding bruising and infection he elects to proceed and has given written consent Patient placed in a supine position palm up with the hand at the side. Area was scant using 12 Hz linear transducer. Biceps tendon just distal to the groove was identified. Area prepped with Betadine. Needle track was anesthetize with 1% lidocaine x2 ML. The tendon sheath was entered using cross sectional imaging and injection with Celestone 6 mg per mL x0.5 mL as well as lidocaine 1% times one ML. Patient tolerated procedure well. Post procedure instructions given.  Increase fluid was seen surrounding the biceps tendon, also shallow groove was noted

## 2018-09-07 NOTE — Patient Instructions (Signed)
Biceps Tendon Tendinitis (Proximal) and Tenosynovitis  The proximal biceps tendon is a strong cord of tissue that connects the biceps muscle, on the front of the upper arm, to the shoulder blade. Tendinitis is inflammation of a tendon. Tenosynovitis is inflammation of the lining around the tendon (tendon sheath). These conditions often occur at the same time, and they can interfere with the ability to bend the elbow and turn the hand palm-up (supination). Proximal biceps tendon tendinitis and tenosynovitis are usually caused by overusing the shoulder joint and the biceps muscle. These conditions usually heal within 6 weeks. Proximal biceps tendon tendinitis may include a grade 1 or grade 2 strain of the tendon. A grade 1 strain is mild, and it involves a slight pull of the tendon without any stretching or noticeable tearing of the tendon. There is usually no loss of biceps muscle strength. A grade 2 strain is moderate, and it involves a small tear in the tendon. The tendon is stretched, and biceps strength is usually decreased. What are the causes? This condition may be caused by:  A sudden increase in frequency or intensity of activity that involves the shoulder and the biceps muscle.  Overuse of the biceps muscle. This can happen when you do the same movements over and over, such as: ? Supination. ? Forceful straightening (hyperextension) of the elbow. ? Bending the elbow.  A direct, forceful hit or injury (trauma) to the elbow. This is rare. What increases the risk? The following factors may make you more likely to develop this condition:  Playing contact sports.  Playing sports that involve throwing and overhead movements, including racket sports, gymnastics, weight lifting, or bodybuilding.  Doing physical labor.  Having poor strength and flexibility of the arm and shoulder. What are the signs or symptoms? Symptoms of this condition may include:  Pain and inflammation in the front of  the shoulder. Pain may get worse with movement, especially when you use resistance, as in weight lifting.  A feeling of warmth in the front of the shoulder.  Limited range of motion of the shoulder and the elbow.  A crackling sound (crepitation) when you move or touch the shoulder or the upper arm. In some cases, symptoms may return (recur) after treatment, and they may be long-lasting (chronic). How is this diagnosed? This condition is diagnosed based on your symptoms, your medical history, and a physical exam. You may have tests, including X-rays or MRIs. Your health care provider may test your range of motion by asking you to do arm movements. How is this treated? This condition is treated by resting and icing the injured area, and by doing physical therapy exercises. Depending on the severity of your condition, treatment may also include:  Medicines to help relieve pain and inflammation.  Ultrasound therapy. This is the application of sound waves to the injured area.  Injecting medicines (corticosteroids) into your tendon sheath.  Injecting medicines that numb the area (local anesthetics).  Surgery to remove the damaged part of the tendon and reattach the undamaged part of the tendon to the arm bone (humerus). This is usually only done if you have symptoms that do not get better with other treatment methods. Follow these instructions at home: Managing pain, stiffness, and swelling   If directed, put ice on the injured area: ? Put ice in a plastic bag. ? Place a towel between your skin and the bag. ? Leave the ice on for 20 minutes, 2-3 times a day.  Move your   fingers often to avoid stiffness and to lessen swelling.  Raise (elevate) the injured area above the level of your heart while you are sitting or lying down.  If directed, apply heat to the affected area before you exercise. Use the heat source that your health care provider recommends, such as a moist heat pack or a  heating pad. ? Place a towel between your skin and the heat source. ? Leave the heat on for 20-30 minutes. ? Remove the heat if your skin turns bright red. This is especially important if you are unable to feel pain, heat, or cold. You may have a greater risk of getting burned. Activity  Return to your normal activities as told by your health care provider. Ask your health care provider what activities are safe for you.  Do not lift anything that is heavier than 10 lb (4.5 kg) until your health care provider tells you that it is safe.  Avoid activities that cause pain or make your condition worse.  Do exercises as told by your health care provider. General instructions  Take over-the-counter and prescription medicines only as told by your health care provider.  Do not drive or operate heavy machinery while taking prescription pain medicines.  Keep all follow-up visits as told by your health care provider. This is important. How is this prevented?  Warm up and stretch before being active.  Cool down and stretch after being active.  Give your body time to rest between periods of activity.  Make sure any equipment that you use is fitted to you.  Be safe and responsible while being active to avoid falls.  Do at least 150 minutes of moderate-intensity aerobic exercise each week, such as brisk walking or water aerobics.  Maintain physical fitness, including: ? Strength. ? Flexibility. ? Cardiovascular fitness. ? Endurance. Contact a health care provider if:  You have symptoms that get worse or do not get better after 2 weeks of treatment.  You develop new symptoms. Get help right away if:  You develop severe pain. This information is not intended to replace advice given to you by your health care provider. Make sure you discuss any questions you have with your health care provider. Document Released: 06/24/2005 Document Revised: 04/03/2017 Document Reviewed: 06/02/2015  [employer] Interactive Patient Education  2019 [employer]  

## 2018-09-15 ENCOUNTER — Encounter: Payer: Self-pay | Admitting: Internal Medicine

## 2018-09-15 ENCOUNTER — Other Ambulatory Visit (INDEPENDENT_AMBULATORY_CARE_PROVIDER_SITE_OTHER): Payer: Medicare Other

## 2018-09-15 ENCOUNTER — Ambulatory Visit (INDEPENDENT_AMBULATORY_CARE_PROVIDER_SITE_OTHER): Payer: Medicare Other | Admitting: Internal Medicine

## 2018-09-15 VITALS — BP 160/90 | HR 96 | Temp 98.6°F | Ht 64.5 in | Wt 191.5 lb

## 2018-09-15 DIAGNOSIS — E559 Vitamin D deficiency, unspecified: Secondary | ICD-10-CM

## 2018-09-15 DIAGNOSIS — I10 Essential (primary) hypertension: Secondary | ICD-10-CM

## 2018-09-15 DIAGNOSIS — F411 Generalized anxiety disorder: Secondary | ICD-10-CM

## 2018-09-15 DIAGNOSIS — D508 Other iron deficiency anemias: Secondary | ICD-10-CM

## 2018-09-15 DIAGNOSIS — M818 Other osteoporosis without current pathological fracture: Secondary | ICD-10-CM

## 2018-09-15 LAB — CBC WITH DIFFERENTIAL/PLATELET
BASOS PCT: 0.8 % (ref 0.0–3.0)
Basophils Absolute: 0.1 10*3/uL (ref 0.0–0.1)
Eosinophils Absolute: 0.3 10*3/uL (ref 0.0–0.7)
Eosinophils Relative: 4.5 % (ref 0.0–5.0)
HCT: 44.9 % (ref 36.0–46.0)
Hemoglobin: 15.1 g/dL — ABNORMAL HIGH (ref 12.0–15.0)
Lymphocytes Relative: 16.3 % (ref 12.0–46.0)
Lymphs Abs: 1 10*3/uL (ref 0.7–4.0)
MCHC: 33.7 g/dL (ref 30.0–36.0)
MCV: 91 fl (ref 78.0–100.0)
Monocytes Absolute: 0.4 10*3/uL (ref 0.1–1.0)
Monocytes Relative: 6.6 % (ref 3.0–12.0)
Neutro Abs: 4.4 10*3/uL (ref 1.4–7.7)
Neutrophils Relative %: 71.8 % (ref 43.0–77.0)
Platelets: 208 10*3/uL (ref 150.0–400.0)
RBC: 4.94 Mil/uL (ref 3.87–5.11)
RDW: 14.5 % (ref 11.5–15.5)
WBC: 6.2 10*3/uL (ref 4.0–10.5)

## 2018-09-15 LAB — BASIC METABOLIC PANEL
BUN: 16 mg/dL (ref 6–23)
CO2: 30 meq/L (ref 19–32)
Calcium: 9.8 mg/dL (ref 8.4–10.5)
Chloride: 101 mEq/L (ref 96–112)
Creatinine, Ser: 0.68 mg/dL (ref 0.40–1.20)
GFR: 84.74 mL/min (ref >60.00)
Glucose, Bld: 89 mg/dL (ref 70–99)
Potassium: 4.3 mEq/L (ref 3.5–5.1)
Sodium: 139 mEq/L (ref 135–145)

## 2018-09-15 LAB — VITAMIN D 25 HYDROXY (VIT D DEFICIENCY, FRACTURES): VITD: 38.36 ng/mL (ref 30.00–100.00)

## 2018-09-15 MED ORDER — ALPRAZOLAM 0.25 MG PO TABS: 0.2500 mg | ORAL_TABLET | Freq: Every evening | ORAL | 3 refills | Status: DC | PRN

## 2018-09-15 NOTE — Progress Notes (Signed)
Subjective:  Patient ID: Katie Stout, female    DOB: 1945/07/21  Age: 73 y.o. MRN: [id number]  CC: Hypertension   HPI Katie Stout presents for a BP check -  Her BP at home is always < 134/74.  She thinks her blood pressure is elevated today because of the whitecoat phenomenon.  She is taking spironolactone and denies any recent episodes of headache, blurred vision, dizziness, or lightheadedness.  Outpatient Medications Prior to Visit  Medication Sig Dispense Refill  . azelastine (OPTIVAR) 0.05 % ophthalmic solution Place 1 drop into both eyes as needed.    . calcium carbonate (TUMS CALCIUM FOR LIFE BONE) 750 MG chewable tablet Chew 2-4 tablets by mouth daily.      . Cholecalciferol 2000 units TABS Take 1 tablet (2,000 Units total) by mouth daily. 90 tablet 1  . fexofenadine (ALLEGRA) 30 MG tablet Take 30 mg by mouth 2 (two) times daily.    . fluticasone (FLONASE) 50 MCG/ACT nasal spray Place into both nostrils daily.    . hyoscyamine (LEVSIN SL) 0.125 MG SL tablet Place 1 tablet (0.125 mg total) under the tongue every 4 (four) hours as needed for cramping. 30 tablet 2  . PEG-KCl-NaCl-NaSulf-Na Asc-C (PLENVU) 140 g SOLR Take 1 kit by mouth as directed. Use coupon: BIN: 119417 PNC: CNRX Group: [id number] ID: [id number] 1 each 0  . rosuvastatin (CRESTOR) 10 MG tablet Take 1 tablet (10 mg total) by mouth daily. 90 tablet 1  . spironolactone (ALDACTONE) 25 MG tablet Take 1 tablet (25 mg total) by mouth daily. 90 tablet 1  . ALPRAZolam (XANAX) 0.25 MG tablet Take 1 tablet (0.25 mg total) by mouth at bedtime as needed for sleep. 30 tablet 3  . oseltamivir (TAMIFLU) 75 MG capsule Take 1 capsule (75 mg total) by mouth 2 (two) times daily. 10 capsule 0   No facility-administered medications prior to visit.     ROS Review of Systems  Constitutional: Negative for appetite change, diaphoresis, fatigue and unexpected weight change.  HENT: Negative.  Negative for trouble swallowing.     Eyes: Negative.   Respiratory: Negative for cough, chest tightness, shortness of breath and wheezing.   Gastrointestinal: Negative for abdominal pain, constipation, diarrhea, nausea and vomiting.  Genitourinary: Negative.  Negative for difficulty urinating.  Musculoskeletal: Negative.  Negative for arthralgias and myalgias.  Skin: Negative.  Negative for color change.  Neurological: Negative.  Negative for dizziness, weakness, light-headedness and headaches.  Hematological: Negative for adenopathy. Does not bruise/bleed easily.  Psychiatric/Behavioral: Negative for behavioral problems, decreased concentration, dysphoric mood, sleep disturbance and suicidal ideas. The patient is nervous/anxious.     Objective:  BP (!) 160/90 (BP Location: Left Arm, Patient Position: Sitting, Cuff Size: Normal)   Pulse 96   Temp 98.6 F (37 C) (Oral)   Ht 5' 4.5" (1.638 m)   Wt 191 lb 8 oz (86.9 kg)   SpO2 98%   BMI 32.36 kg/m   BP Readings from Last 3 Encounters:  09/15/18 (!) 160/90  09/07/18 (!) 151/82  08/27/18 130/76    Wt Readings from Last 3 Encounters:  09/15/18 191 lb 8 oz (86.9 kg)  09/07/18 194 lb (88 kg)  08/27/18 193 lb (87.5 kg)    Physical Exam Vitals signs reviewed.  Constitutional:      Appearance: She is obese. She is not ill-appearing or diaphoretic.  HENT:     Nose: Nose normal. No congestion.     Mouth/Throat:     Mouth:  Mucous membranes are moist.     Pharynx: Oropharynx is clear. No oropharyngeal exudate or posterior oropharyngeal erythema.  Eyes:     Conjunctiva/sclera: Conjunctivae normal.  Neck:     Musculoskeletal: Normal range of motion. No neck rigidity or muscular tenderness.  Cardiovascular:     Rate and Rhythm: Normal rate and regular rhythm.     Heart sounds: No murmur. No gallop.   Pulmonary:     Effort: Pulmonary effort is normal.     Breath sounds: No stridor. No wheezing, rhonchi or rales.  Abdominal:     General: Bowel sounds are normal.      Palpations: There is no hepatomegaly, splenomegaly or mass.     Tenderness: There is no abdominal tenderness. There is no guarding.  Musculoskeletal: Normal range of motion.        General: No swelling.     Right lower leg: No edema.     Left lower leg: No edema.  Lymphadenopathy:     Cervical: No cervical adenopathy.  Skin:    General: Skin is warm and dry.     Coloration: Skin is not pale.     Findings: No erythema or rash.  Neurological:     General: No focal deficit present.     Mental Status: She is oriented to person, place, and time. Mental status is at baseline.     Lab Results  Component Value Date   WBC 6.2 09/15/2018   HGB 15.1 (H) 09/15/2018   HCT 44.9 09/15/2018   PLT 208.0 09/15/2018   GLUCOSE 89 09/15/2018   CHOL 151 03/24/2018   TRIG 102.0 03/24/2018   HDL 61.70 03/24/2018   LDLDIRECT 132.2 06/10/2012   LDLCALC 68 03/24/2018   ALT 33 03/24/2018   AST 23 03/24/2018   NA 139 09/15/2018   K 4.3 09/15/2018   CL 101 09/15/2018   CREATININE 0.68 09/15/2018   BUN 16 09/15/2018   CO2 30 09/15/2018   TSH 3.38 03/24/2018   HGBA1C 5.9 12/26/2006    Ct Soft Tissue Neck W Contrast  Result Date: 09/17/2017 CLINICAL DATA:  Neurologic dysphonia EXAM: CT NECK WITH CONTRAST TECHNIQUE: Multidetector CT imaging of the neck was performed using the standard protocol following the bolus administration of intravenous contrast. Creatinine was obtained on site at [facility] at [address] Results: Creatinine 0.7 mg/dL. CONTRAST:  Reference EMR COMPARISON:  03/10/2015 FINDINGS: Pharynx and larynx: There is a gas filled structure in the right paraglottic fat consistent with laryngocele, neck not resolved. At time of imaging the sac measured 12 mm. There is a smooth submucosal soft tissue density nodule symmetrically on the left that is stable. There is mild supraglottic airway distortion, greater on the right. Salivary glands: No inflammation, mass, or stone. Thyroid:  5 mm left thyroid nodule, incidental at this small size. Lymph nodes: None enlarged or abnormal density Vascular: Atherosclerotic calcification of the aorta and cervical carotids. Limited intracranial: Negative Visualized orbits: Bilateral cataract resection. Mastoids and visualized paranasal sinuses: Minor mucosal thickening on the floors of the maxillary sinuses. Secretions present in the right maxillary sinus. Skeleton: No acute or aggressive finding cervical facet arthropathy with multilevel ankylosis. Thoracic scoliosis. Upper chest: Biapical pleural scarring. IMPRESSION: 1. 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 2. Symmetric soft tissue density mass in the left paraglottic fat that favors opacified internal laryngocele, stable from 2016. Electronically Signed   By: Neva Seat.D.  On: 09/17/2017 14:54    Assessment & Plan:   Saliha was seen today for hypertension.  Diagnoses and all orders for this visit:  Essential hypertension- Her blood pressure is elevated today but she is convinced me that this is the whitecoat phenomenon.  Her electrolytes and renal function are normal.  Will continue spironolactone at the current dose. -     Basic metabolic panel; Future  Other iron deficiency anemia- Her H&H are normal now. -     CBC with Differential/Platelet; Future  Other osteoporosis, unspecified pathological fracture presence -     VITAMIN D 25 Hydroxy (Vit-D Deficiency, Fractures); Future  Vitamin D deficiency - Her vitamin D level is in the normal range. -     VITAMIN D 25 Hydroxy (Vit-D Deficiency, Fractures); Future  GAD (generalized anxiety disorder) -     ALPRAZolam (XANAX) 0.25 MG tablet; Take 1 tablet (0.25 mg total) by mouth at bedtime as needed for sleep.   I have discontinued [place] oseltamivir. I am also having her maintain her calcium carbonate, fexofenadine, fluticasone, azelastine,  spironolactone, rosuvastatin, Cholecalciferol, PEG-KCl-NaCl-NaSulf-Na Asc-C, hyoscyamine, and ALPRAZolam.  Meds ordered this encounter  Medications  . ALPRAZolam (XANAX) 0.25 MG tablet    Sig: Take 1 tablet (0.25 mg total) by mouth at bedtime as needed for sleep.    Dispense:  30 tablet    Refill:  3     Follow-up: Return in about 6 months (around 03/18/2019).  [name], MD

## 2018-09-15 NOTE — Patient Instructions (Signed)

## 2018-09-17 ENCOUNTER — Other Ambulatory Visit: Payer: Self-pay

## 2018-09-17 ENCOUNTER — Encounter: Payer: Self-pay | Admitting: Internal Medicine

## 2018-09-17 ENCOUNTER — Ambulatory Visit (AMBULATORY_SURGERY_CENTER): Payer: Medicare Other | Admitting: Internal Medicine

## 2018-09-17 VITALS — BP 119/75 | HR 80 | Resp 15

## 2018-09-17 DIAGNOSIS — D120 Benign neoplasm of cecum: Secondary | ICD-10-CM

## 2018-09-17 DIAGNOSIS — I10 Essential (primary) hypertension: Secondary | ICD-10-CM | POA: Diagnosis not present

## 2018-09-17 DIAGNOSIS — K449 Diaphragmatic hernia without obstruction or gangrene: Secondary | ICD-10-CM

## 2018-09-17 DIAGNOSIS — K5900 Constipation, unspecified: Secondary | ICD-10-CM | POA: Diagnosis not present

## 2018-09-17 DIAGNOSIS — R1013 Epigastric pain: Secondary | ICD-10-CM

## 2018-09-17 DIAGNOSIS — Z800 Family history of malignant neoplasm of digestive organs: Secondary | ICD-10-CM

## 2018-09-17 DIAGNOSIS — K219 Gastro-esophageal reflux disease without esophagitis: Secondary | ICD-10-CM | POA: Diagnosis not present

## 2018-09-17 DIAGNOSIS — Z86010 Personal history of colonic polyps: Secondary | ICD-10-CM

## 2018-09-17 DIAGNOSIS — R140 Abdominal distension (gaseous): Secondary | ICD-10-CM | POA: Diagnosis not present

## 2018-09-17 DIAGNOSIS — K635 Polyp of colon: Secondary | ICD-10-CM | POA: Diagnosis not present

## 2018-09-17 LAB — HM COLONOSCOPY

## 2018-09-17 MED ORDER — SODIUM CHLORIDE 0.9 % IV SOLN: 4.0000 mg | Freq: Once | INTRAVENOUS | Status: DC

## 2018-09-17 MED ORDER — ONDANSETRON HCL 4 MG/2ML IJ SOLN
4.0000 mg | Freq: Once | INTRAMUSCULAR | Status: AC
  Administered 2018-09-17: 4 mg via INTRAVENOUS

## 2018-09-17 NOTE — Addendum Note (Signed)
Addended by: Darron Doom on: 09/17/2018 02:07 PM   Modules accepted: Orders

## 2018-09-17 NOTE — Addendum Note (Signed)
Addended by: Darron Doom on: 09/17/2018 01:59 PM   Modules accepted: Orders

## 2018-09-17 NOTE — Op Note (Signed)
[facility] [name] Procedure Date: 09/17/2018 9:21 AM MRN: [id number] Endoscopist: Jerene Bears , MD Age: 73 Referring MD:  Date of Birth: 02-20-1946 Gender: Female Account #: [id number] Procedure:                Colonoscopy Indications:              Screening in patient at increased risk: Family                            history of 1st-degree relative with colorectal                            cancer, Last colonoscopy: 2014 Medicines:                Monitored Anesthesia Care Procedure:                Pre-Anesthesia Assessment:                           - Prior to the procedure, a History and Physical                            was performed, and patient medications and                            allergies were reviewed. The patient's tolerance of                            previous anesthesia was also reviewed. The risks                            and benefits of the procedure and the sedation                            options and risks were discussed with the patient.                            All questions were answered, and informed consent                            was obtained. Prior Anticoagulants: The patient has                            taken no previous anticoagulant or antiplatelet                            agents. ASA Grade Assessment: III - A patient with                            severe systemic disease. After reviewing the risks                            and benefits, the patient was deemed in  satisfactory condition to undergo the procedure.                           After obtaining informed consent, the colonoscope                            was passed under direct vision. Throughout the                            procedure, the patient's blood pressure, pulse, and                            oxygen saturations were monitored continuously. The                            Colonoscope was introduced  through the anus and                            advanced to the cecum, identified by appendiceal                            orifice and ileocecal valve. The colonoscopy was                            performed without difficulty. The patient tolerated                            the procedure well. The quality of the bowel                            preparation was good. The ileocecal valve,                            appendiceal orifice, and rectum were photographed. Scope In: 9:40:18 AM Scope Out: 9:59:47 AM Scope Withdrawal Time: 0 hours 12 minutes 47 seconds  Total Procedure Duration: 0 hours 19 minutes 29 seconds  Findings:                 The digital rectal exam was normal.                           A 2 mm polyp was found in the cecum. The polyp was                            sessile. The polyp was removed with a cold biopsy                            forceps. Resection and retrieval were complete.                           Multiple small and large-mouthed diverticula were                            found in the sigmoid colon and descending  colon.                           The retroflexed view of the distal rectum and anal                            verge was normal and showed no anal or rectal                            abnormalities. Complications:            No immediate complications. Estimated Blood Loss:     Estimated blood loss was minimal. Impression:               - One 2 mm polyp in the cecum, removed with a cold                            biopsy forceps. Resected and retrieved.                           - Diverticulosis in the sigmoid colon and in the                            descending colon.                           - The distal rectum and anal verge are normal on                            retroflexion view. Recommendation:           - Patient has a contact number available for                            emergencies. The signs and symptoms of potential                             delayed complications were discussed with the                            patient. Return to normal activities tomorrow.                            Written discharge instructions were provided to the                            patient.                           - Resume previous diet.                           - Continue present medications.                           - Await pathology results.                           -  Repeat colonoscopy is recommended for                            surveillance. The colonoscopy date will be                            determined after pathology results from today's                            exam become available for review. Jerene Bears, MD 09/17/2018 10:15:07 AM This report has been signed electronically.

## 2018-09-17 NOTE — Patient Instructions (Signed)
Thank you for allowing [place] to care for you today!  Await pathology results by mail, approximately 2 weeks.  Resume previous diet and medications today.  Return to normal activities tomorrow.  Polyp, diverticulosis, and hiatal hernia handouts provided.     YOU HAD AN ENDOSCOPIC PROCEDURE TODAY AT [facility]:   Refer to the procedure report that was given to you for any specific questions about what was found during the examination.  If the procedure report does not answer your questions, please call your gastroenterologist to clarify.  If you requested that your care partner not be given the details of your procedure findings, then the procedure report has been included in a sealed envelope for you to review at your convenience later.  YOU SHOULD EXPECT: Some feelings of bloating in the abdomen. Passage of more gas than usual.  Walking can help get rid of the air that was put into your GI tract during the procedure and reduce the bloating. If you had a lower endoscopy (such as a colonoscopy or flexible sigmoidoscopy) you may notice spotting of blood in your stool or on the toilet paper. If you underwent a bowel prep for your procedure, you may not have a normal bowel movement for a few days.  Please Note:  You might notice some irritation and congestion in your nose or some drainage.  This is from the oxygen used during your procedure.  There is no need for concern and it should clear up in a day or so.  SYMPTOMS TO REPORT IMMEDIATELY:   Following lower endoscopy (colonoscopy or flexible sigmoidoscopy):  Excessive amounts of blood in the stool  Significant tenderness or worsening of abdominal pains  Swelling of the abdomen that is new, acute  Fever of 100F or higher   Following upper endoscopy (EGD)  Vomiting of blood or coffee ground material  New chest pain or pain under the shoulder blades  Painful or persistently difficult swallowing  New shortness of  breath  Fever of 100F or higher  Black, tarry-looking stools  For urgent or emergent issues, a gastroenterologist can be reached at any hour by calling [phone].   DIET:  We do recommend a small meal at first, but then you may proceed to your regular diet.  Drink plenty of fluids but you should avoid alcoholic beverages for 24 hours.  ACTIVITY:  You should plan to take it easy for the rest of today and you should NOT DRIVE or use heavy machinery until tomorrow (because of the sedation medicines used during the test).    FOLLOW UP: Our staff will call the number listed on your records the next business day following your procedure to check on you and address any questions or concerns that you may have regarding the information given to you following your procedure. If we do not reach you, we will leave a message.  However, if you are feeling well and you are not experiencing any problems, there is no need to return our call.  We will assume that you have returned to your regular daily activities without incident.  If any biopsies were taken you will be contacted by phone or by letter within the next 1-3 weeks.  Please call us at [phone] if you have not heard about the biopsies in 3 weeks.    SIGNATURES/CONFIDENTIALITY: You and/or your care partner have signed paperwork which will be entered into your electronic medical record.  These signatures attest to the fact  that that the information above on your After Visit Summary has been reviewed and is understood.  Full responsibility of the confidentiality of this discharge information lies with you and/or your care-partner.

## 2018-09-17 NOTE — Progress Notes (Signed)
Delay charting due to epic downtime.

## 2018-09-17 NOTE — Op Note (Signed)
[facility] [name] Procedure Date: 09/17/2018 9:22 AM MRN: [id number] Endoscopist: Jerene Bears , MD Age: 73 Referring MD:  Date of Birth: [birth year] Gender: Female Account #: [id number] Procedure:                Upper GI endoscopy Indications:              Epigastric abdominal pain Medicines:                Monitored Anesthesia Care Procedure:                Pre-Anesthesia Assessment:                           - Prior to the procedure, a History and Physical                            was performed, and patient medications and                            allergies were reviewed. The patient's tolerance of                            previous anesthesia was also reviewed. The risks                            and benefits of the procedure and the sedation                            options and risks were discussed with the patient.                            All questions were answered, and informed consent                            was obtained. Prior Anticoagulants: The patient has                            taken no previous anticoagulant or antiplatelet                            agents. ASA Grade Assessment: III - A patient with                            severe systemic disease. After reviewing the risks                            and benefits, the patient was deemed in                            satisfactory condition to undergo the procedure.                           After obtaining informed consent, the endoscope was  passed under direct vision. Throughout the                            procedure, the patient's blood pressure, pulse, and                            oxygen saturations were monitored continuously. The                            Endoscope was introduced through the mouth, and                            advanced to the second part of duodenum. The upper                            GI endoscopy was  accomplished without difficulty.                            The patient tolerated the procedure well. Scope In: Scope Out: Findings:                 A 4 cm hiatal hernia was present.                           Normal mucosa was found in the entire esophagus.                           The entire examined stomach was normal.                           The examined duodenum was normal. Complications:            No immediate complications. Estimated Blood Loss:     Estimated blood loss: none. Impression:               - 4 cm hiatal hernia.                           - Normal mucosa was found in the entire esophagus.                           - Normal stomach.                           - Normal examined duodenum.                           - No specimens collected. Recommendation:           - Patient has a contact number available for                            emergencies. The signs and symptoms of potential                            delayed complications were discussed with the  patient. Return to normal activities tomorrow.                            Written discharge instructions were provided to the                            patient.                           - Resume previous diet.                           - Continue present medications.                           - Await pathology results. Jerene Bears, MD 09/17/2018 10:11:03 AM This report has been signed electronically.

## 2018-09-18 ENCOUNTER — Telehealth: Payer: Self-pay | Admitting: *Deleted

## 2018-09-18 NOTE — Telephone Encounter (Signed)
  Follow up Call-  Call back number 09/17/2018  Post procedure Call Back phone  # [phone]  Permission to leave phone message Yes  Some recent data might be hidden     Patient questions:  Do you have a fever, pain , or abdominal swelling? No. Pain Score  0 *  Have you tolerated food without any problems? Yes.    Have you been able to return to your normal activities? Yes.    Do you have any questions about your discharge instructions: Diet   No. Medications  No. Follow up visit  No.  Do you have questions or concerns about your Care? No.  Actions: * If pain score is 4 or above: No action needed, pain <4.

## 2018-09-23 ENCOUNTER — Encounter: Payer: Self-pay | Admitting: Internal Medicine

## 2018-09-23 LAB — HM COLONOSCOPY

## 2018-10-02 ENCOUNTER — Encounter: Payer: Self-pay | Admitting: Neurology

## 2018-10-02 NOTE — Progress Notes (Signed)
Botox approval valid through 07/09/2019. Patient to use buy and bill.

## 2018-10-23 ENCOUNTER — Ambulatory Visit: Payer: Medicare Other | Admitting: Neurology

## 2018-11-06 ENCOUNTER — Ambulatory Visit: Payer: Medicare Other | Admitting: Neurology

## 2018-11-25 ENCOUNTER — Ambulatory Visit: Payer: Self-pay | Admitting: *Deleted

## 2018-11-25 NOTE — Telephone Encounter (Signed)
Pt reports cough,earache of both ears, sore throat, chills, body aches. States onset of symptoms Monday.Reports temp 97.? States throat red, "And I have some post nasal drip." Denies any SOB, CP.  States 13 days ago "Went into gas station and did not wear mask and was close to people in there."  States using Afrin, ineffective. Reports cough kept her awake last night, dry. After hours call. Pt's email and Ph # verified, has access to computer. Instructed pt to self isolate, reviewed precautions. Instructed pt to go to ED if SOB, CP present, symptoms worsen. Pt verbalizes understanding.  Please advise: [phone] Reason for Disposition . [1] COVID-19 infection diagnosed or suspected AND [2] mild symptoms (fever, cough) AND [3] no trouble breathing or other complications  Answer Assessment - Initial Assessment Questions 1. COVID-19 DIAGNOSIS: "Who made your Coronavirus (COVID-19) diagnosis?" "Was it confirmed by a positive lab test?" If not diagnosed by a HCP, ask "Are there lots of cases (community spread) where you live?" (See public health department website, if unsure)   * MAJOR community spread: high number of cases; numbers of cases are increasing; many people hospitalized.   * MINOR community spread: low number of cases; not increasing; few or no people hospitalized     N/A 2. ONSET: "When did the COVID-19 symptoms start?"      Monday 3. WORST SYMPTOM: "What is your worst symptom?" (e.g., cough, fever, shortness of breath, muscle aches)    Cough 4. COUGH: "Do you have a cough?" If so, ask: "How bad is the cough?"      BAd, kept me up at night, Dry 5. FEVER: "Do you have a fever?" If so, ask: "What is your temperature, how was it measured, and when did it start?"     Chills, body aches 6. RESPIRATORY STATUS: "Describe your breathing?" (e.g., shortness of breath, wheezing, unable to speak)      WNL 7. BETTER-SAME-WORSE: "Are you getting better, staying the same or getting worse compared to  yesterday?"  If getting worse, ask, "In what way?"     Worse today because I didn't sleep 8. HIGH RISK DISEASE: "Do you have any chronic medical problems?" (e.g., asthma, heart or lung disease, weak immune system, etc.)     *No Answer* 10. OTHER SYMPTOMS: "Do you have any other symptoms?"  (e.g., runny nose, headache, sore throat, loss of smell)       Sore throat,ear ache both ears, :Post nasal drip" chills, body aches  Protocols used: CORONAVIRUS (COVID-19) DIAGNOSED OR SUSPECTED-A-AH

## 2018-11-26 ENCOUNTER — Telehealth: Payer: Self-pay

## 2018-11-26 ENCOUNTER — Ambulatory Visit: Payer: Medicare Other | Admitting: Internal Medicine

## 2018-11-26 ENCOUNTER — Telehealth: Payer: Self-pay | Admitting: Hematology

## 2018-11-26 DIAGNOSIS — Z20822 Contact with and (suspected) exposure to covid-19: Secondary | ICD-10-CM

## 2018-11-26 NOTE — Telephone Encounter (Signed)
Called pt and offered OV or Doxy with PCP. Pt opted out of doing the OV but is interested in doing the Doxy.   Pt has decided to do a Doxy today at 03:30pm.   Email: [email or web address]

## 2018-11-26 NOTE — Telephone Encounter (Signed)
Pt is symptomatic with fever, cough (productive) and sore throat. PCP would like to have pt tested for COVID

## 2018-11-26 NOTE — Progress Notes (Signed)
Virtual Visit via Video Note  I connected with Katie Stout on 11/26/18 at  3:30 PM EDT by a video enabled telemedicine application and verified that I am speaking with the correct person using two identifiers.   I discussed the limitations of evaluation and management by telemedicine and the availability of in person appointments. The patient expressed understanding and agreed to proceed.  History of Present Illness:  She was not seen   Observations/Objective:  Lab Results  Component Value Date   WBC 6.2 09/15/2018   HGB 15.1 (H) 09/15/2018   HCT 44.9 09/15/2018   PLT 208.0 09/15/2018   GLUCOSE 89 09/15/2018   CHOL 151 03/24/2018   TRIG 102.0 03/24/2018   HDL 61.70 03/24/2018   LDLDIRECT 132.2 06/10/2012   LDLCALC 68 03/24/2018   ALT 33 03/24/2018   AST 23 03/24/2018   NA 139 09/15/2018   K 4.3 09/15/2018   CL 101 09/15/2018   CREATININE 0.68 09/15/2018   BUN 16 09/15/2018   CO2 30 09/15/2018   TSH 3.38 03/24/2018   HGBA1C 5.9 12/26/2006     Assessment and Plan:   Follow Up Instructions:    I discussed the assessment and treatment plan with the patient. The patient was provided an opportunity to ask questions and all were answered. The patient agreed with the plan and demonstrated an understanding of the instructions.   The patient was advised to call back or seek an in-person evaluation if the symptoms worsen or if the condition fails to improve as anticipated.  I provided 0 minutes of non-face-to-face time during this encounter.   [name], MD

## 2018-11-26 NOTE — Telephone Encounter (Signed)
See TE and OV from 11-26-2018 / Dr. Ronnald Ramp requesting COVID screening. / Testing ordered and scheduled.

## 2018-11-26 NOTE — Telephone Encounter (Signed)
Spoke with patient.  Testing scheduled and ordered

## 2018-11-27 ENCOUNTER — Encounter: Payer: Self-pay | Admitting: Internal Medicine

## 2018-11-27 ENCOUNTER — Ambulatory Visit (INDEPENDENT_AMBULATORY_CARE_PROVIDER_SITE_OTHER): Payer: Medicare Other | Admitting: Internal Medicine

## 2018-11-27 ENCOUNTER — Ambulatory Visit: Payer: Self-pay | Admitting: *Deleted

## 2018-11-27 ENCOUNTER — Other Ambulatory Visit: Payer: Medicare Other

## 2018-11-27 DIAGNOSIS — Z20822 Contact with and (suspected) exposure to covid-19: Secondary | ICD-10-CM

## 2018-11-27 DIAGNOSIS — R6889 Other general symptoms and signs: Secondary | ICD-10-CM | POA: Diagnosis not present

## 2018-11-27 MED ORDER — DOXYCYCLINE HYCLATE 100 MG PO TABS: 100.0000 mg | ORAL_TABLET | Freq: Two times a day (BID) | ORAL | 0 refills | Status: DC

## 2018-11-27 NOTE — Telephone Encounter (Signed)
Called pt and she will have a telephone visit with Dr. Sharlet Salina.

## 2018-11-27 NOTE — Assessment & Plan Note (Signed)
Has had testing, reiterated self-isolation until testing returns. If negative can break isolation. If positive needs to isolate until symptoms improve for 3 days. Will cover with doxycycline in case of developing pneumonia. Encouraged to keep taking zyrtec and flonase.

## 2018-11-27 NOTE — Telephone Encounter (Signed)
  Reason for call:  Patient coughing up huge amounts of green phlegm, requesting OTC recommendation, please advise   She felt sick on Monday and started coughing on Tuesday.  But this morning coughed about 8 times that is green. Has not taken anything yet Head feels full. No fever. She is scheduled for a covid-19 test for today. Notified flow at [facility] at [facility]  Regarding pt's symptoms and will route to provider for recommendation. Pt had a virtual visit yesterday that she could not get on. Routing to [facility]  At [facility].  Reason for Disposition . SEVERE coughing spells (e.g., whooping sound after coughing, vomiting after coughing)  Answer Assessment - Initial Assessment Questions 1. ONSET: "When did the cough begin?"      Tues 2. SEVERITY: "How bad is the cough today?"      bad 3. RESPIRATORY DISTRESS: "Describe your breathing."      Taking a deep breath makes her cough more 4. FEVER: "Do you have a fever?" If so, ask: "What is your temperature, how was it measured, and when did it start?"     No 5. SPUTUM: "Describe the color of your sputum" (clear, white, yellow, green)     Green 6. HEMOPTYSIS: "Are you coughing up any blood?" If so ask: "How much?" (flecks, streaks, tablespoons, etc.)     no 7. CARDIAC HISTORY: "Do you have any history of heart disease?" (e.g., heart attack, congestive heart failure)     HTN but under control unless she is sick 8. LUNG HISTORY: "Do you have any history of lung disease?"  (e.g., pulmonary embolus, asthma, emphysema)     no 9. PE RISK FACTORS: "Do you have a history of blood clots?" (or: recent major surgery, recent prolonged travel, bedridden)     no 10. OTHER SYMPTOMS: "Do you have any other symptoms?" (e.g., runny nose, wheezing, chest pain)       no 11. PREGNANCY: "Is there any chance you are pregnant?" "When was your last menstrual period?"       n/a 12. TRAVEL: "Have you traveled out of the country in the last month?" (e.g., travel history,  exposures)       no  Protocols used: [place]

## 2018-11-27 NOTE — Progress Notes (Signed)
Virtual Visit via Audio-only Note  I connected with Katie Stout on 11/27/18 at 11:40 AM EDT by an audio only enabled telemedicine application and verified that I am speaking with the correct person using two identifiers.  The patient and the provider were at separate locations throughout the entire encounter.   I discussed the limitations of evaluation and management by telemedicine and the availability of in person appointments. The patient expressed understanding and agreed to proceed.  History of Present Illness: The patient is a 73 y.o. female with visit for concerns about sinus infection. Started about 4-5 days ago. Has cough with green sputum which is new. Some sore throat and running nose. Denies headaches. Some chills. Fatigue. Denies loss of taste. Denies SOB. Overall it is stable since onset to mild worsening. Has tried flonase and zyrtec which are not helping much  Observations/Objective: voice strong, no coughing during visit, EOM  Assessment and Plan: See problem oriented charting  Follow Up Instructions: rx doxycycline to cover for lung infection while covid-19 test is processing, continue zyrtec and flonase  Visit time 11 minutes: that time was spent in face to face counseling and coordination of care with the patient: counseled about as above  I discussed the assessment and treatment plan with the patient. The patient was provided an opportunity to ask questions and all were answered. The patient agreed with the plan and demonstrated an understanding of the instructions.   The patient was advised to call back or seek an in-person evaluation if the symptoms worsen or if the condition fails to improve as anticipated.  Hoyt Koch, MD

## 2018-11-29 LAB — NOVEL CORONAVIRUS, NAA: SARS-CoV-2, NAA: NOT DETECTED

## 2018-12-01 ENCOUNTER — Other Ambulatory Visit: Payer: Self-pay | Admitting: Internal Medicine

## 2018-12-01 DIAGNOSIS — I10 Essential (primary) hypertension: Secondary | ICD-10-CM

## 2018-12-01 DIAGNOSIS — E876 Hypokalemia: Secondary | ICD-10-CM

## 2018-12-02 ENCOUNTER — Other Ambulatory Visit: Payer: Self-pay | Admitting: Internal Medicine

## 2018-12-02 DIAGNOSIS — E559 Vitamin D deficiency, unspecified: Secondary | ICD-10-CM

## 2018-12-02 DIAGNOSIS — E785 Hyperlipidemia, unspecified: Secondary | ICD-10-CM

## 2018-12-04 ENCOUNTER — Telehealth: Payer: Self-pay | Admitting: Internal Medicine

## 2018-12-04 NOTE — Telephone Encounter (Signed)
No call made - pt was negative for COVID19

## 2018-12-08 ENCOUNTER — Ambulatory Visit: Payer: Medicare Other | Admitting: Physical Medicine & Rehabilitation

## 2018-12-17 ENCOUNTER — Ambulatory Visit (INDEPENDENT_AMBULATORY_CARE_PROVIDER_SITE_OTHER): Payer: Medicare Other | Admitting: Neurology

## 2018-12-17 ENCOUNTER — Other Ambulatory Visit: Payer: Self-pay

## 2018-12-17 DIAGNOSIS — G243 Spasmodic torticollis: Secondary | ICD-10-CM | POA: Diagnosis not present

## 2018-12-17 MED ORDER — ONABOTULINUMTOXINA 100 UNITS IJ SOLR
240.0000 [IU] | Freq: Once | INTRAMUSCULAR | Status: AC
  Administered 2018-12-17: 240 [IU] via INTRAMUSCULAR

## 2018-12-17 NOTE — Progress Notes (Signed)
Wasted 12 units

## 2018-12-17 NOTE — Procedures (Signed)
[facility]   Procedure Note Botox  Attending: Dr. Wells Guiles Saumya Hukill  Preoperative Diagnosis(es): Cervical Dystonia  Result History  Onset of effect: unknown Clinical results:  Worked so well that she cancelled last visit Adverse Effects: happy as dysphagia not present last visit like previously  Consent obtained from: The patient Benefits discussed included, but were not limited to decreased muscle tightness, increased joint range of motion, and decreased pain.  Risk discussed included, but were not limited pain and discomfort, bleeding, bruising, excessive weakness, venous thrombosis, muscle atrophy and dysphagia.  A copy of the patient medication guide was given to the patient which explains the blackbox warning.  Patients identity and treatment sites confirmed Yes.  .  Details of Procedure: Skin was cleaned with alcohol.  A 30 gauge, 1/2 inch needle was introduced to the target muscle (except splenius capitus, posterior approach, where 27 inch, 1 1/2 gauge needle was used).  Prior to injection, the needle plunger was aspirated to make sure the needle was not within a blood vessel.  There was no blood retrieved on aspiration.    Following is a summary of the muscles injected  And the amount of Botulinum toxin used:   Dilution 0.9% preservative free saline mixed with 100 u Botox type A to make 10 U per 0.1cc  Injections  Location Left  Right Units Number of sites        Sternocleidomastoid 70  70 1  Splenius Capitus, posterior approach  80 80 1  Splenius Capitus, lateral approach  40 40 1  Levator Scapulae      Trapezius 20/20/10  50 3        TOTAL UNITS:   240    Agent: Botulinum Type A ( Onobotulinum Toxin type A ).  2 vials of Botox were used, each containing 100 units and freshly diluted with 1 mL of sterile, non-preserved saline   Total injected (Units): 240  Total wasted (Units): 12   Pt tolerated procedure well without complications.   Reinjection is anticipated  in 3 months.

## 2018-12-25 ENCOUNTER — Other Ambulatory Visit: Payer: Self-pay | Admitting: Internal Medicine

## 2018-12-25 DIAGNOSIS — I10 Essential (primary) hypertension: Secondary | ICD-10-CM

## 2018-12-25 DIAGNOSIS — E876 Hypokalemia: Secondary | ICD-10-CM

## 2018-12-28 ENCOUNTER — Telehealth: Payer: Self-pay | Admitting: Internal Medicine

## 2018-12-28 NOTE — Telephone Encounter (Signed)
Dr Hilarie Fredrickson- Patient is requesting omeprazole "refill." I do not see where there has been a prescription of this given since 2014 and recent endoscopy was normal. OK to prescribe?

## 2018-12-29 MED ORDER — OMEPRAZOLE 20 MG PO CPDR: 20.0000 mg | DELAYED_RELEASE_CAPSULE | Freq: Every day | ORAL | 1 refills | Status: DC

## 2018-12-29 NOTE — Telephone Encounter (Signed)
Ok to fill Rx for omeprazole 20-40 mg daily

## 2018-12-29 NOTE — Telephone Encounter (Signed)
Rx sent 

## 2019-01-01 DIAGNOSIS — H35363 Drusen (degenerative) of macula, bilateral: Secondary | ICD-10-CM | POA: Diagnosis not present

## 2019-01-01 DIAGNOSIS — Z961 Presence of intraocular lens: Secondary | ICD-10-CM | POA: Diagnosis not present

## 2019-01-01 DIAGNOSIS — H43811 Vitreous degeneration, right eye: Secondary | ICD-10-CM | POA: Diagnosis not present

## 2019-01-01 DIAGNOSIS — H04123 Dry eye syndrome of bilateral lacrimal glands: Secondary | ICD-10-CM | POA: Diagnosis not present

## 2019-01-01 DIAGNOSIS — H524 Presbyopia: Secondary | ICD-10-CM | POA: Diagnosis not present

## 2019-01-20 ENCOUNTER — Other Ambulatory Visit: Payer: Self-pay | Admitting: Internal Medicine

## 2019-01-22 ENCOUNTER — Other Ambulatory Visit: Payer: Self-pay | Admitting: Internal Medicine

## 2019-03-22 ENCOUNTER — Ambulatory Visit (INDEPENDENT_AMBULATORY_CARE_PROVIDER_SITE_OTHER): Payer: Medicare Other | Admitting: Internal Medicine

## 2019-03-22 ENCOUNTER — Encounter: Payer: Self-pay | Admitting: Internal Medicine

## 2019-03-22 ENCOUNTER — Other Ambulatory Visit (INDEPENDENT_AMBULATORY_CARE_PROVIDER_SITE_OTHER): Payer: Medicare Other

## 2019-03-22 ENCOUNTER — Other Ambulatory Visit: Payer: Self-pay

## 2019-03-22 VITALS — BP 160/80 | HR 80 | Temp 98.0°F | Resp 16 | Ht 64.5 in | Wt 189.0 lb

## 2019-03-22 DIAGNOSIS — Z23 Encounter for immunization: Secondary | ICD-10-CM

## 2019-03-22 DIAGNOSIS — L309 Dermatitis, unspecified: Secondary | ICD-10-CM | POA: Diagnosis not present

## 2019-03-22 DIAGNOSIS — I10 Essential (primary) hypertension: Secondary | ICD-10-CM

## 2019-03-22 DIAGNOSIS — E559 Vitamin D deficiency, unspecified: Secondary | ICD-10-CM | POA: Diagnosis not present

## 2019-03-22 DIAGNOSIS — E785 Hyperlipidemia, unspecified: Secondary | ICD-10-CM

## 2019-03-22 DIAGNOSIS — D508 Other iron deficiency anemias: Secondary | ICD-10-CM

## 2019-03-22 LAB — CBC WITH DIFFERENTIAL/PLATELET
Basophils Absolute: 0.1 10*3/uL (ref 0.0–0.1)
Basophils Relative: 0.7 % (ref 0.0–3.0)
Eosinophils Absolute: 0.3 10*3/uL (ref 0.0–0.7)
Eosinophils Relative: 4.8 % (ref 0.0–5.0)
HCT: 45.4 % (ref 36.0–46.0)
Hemoglobin: 15 g/dL (ref 12.0–15.0)
Lymphocytes Relative: 14.7 % (ref 12.0–46.0)
Lymphs Abs: 1 10*3/uL (ref 0.7–4.0)
MCHC: 33 g/dL (ref 30.0–36.0)
MCV: 90.3 fl (ref 78.0–100.0)
Monocytes Absolute: 0.4 10*3/uL (ref 0.1–1.0)
Monocytes Relative: 6.2 % (ref 3.0–12.0)
Neutro Abs: 5.1 10*3/uL (ref 1.4–7.7)
Neutrophils Relative %: 73.6 % (ref 43.0–77.0)
Platelets: 217 10*3/uL (ref 150.0–400.0)
RBC: 5.03 Mil/uL (ref 3.87–5.11)
RDW: 14.1 % (ref 11.5–15.5)
WBC: 7 10*3/uL (ref 4.0–10.5)

## 2019-03-22 LAB — BASIC METABOLIC PANEL
BUN: 12 mg/dL (ref 6–23)
CO2: 30 mEq/L (ref 19–32)
Calcium: 10 mg/dL (ref 8.4–10.5)
Chloride: 99 mEq/L (ref 96–112)
Creatinine, Ser: 0.73 mg/dL (ref 0.40–1.20)
GFR: 77.97 mL/min (ref >60.00)
Glucose, Bld: 87 mg/dL (ref 70–99)
Potassium: 4.4 mEq/L (ref 3.5–5.1)
Sodium: 139 mEq/L (ref 135–145)

## 2019-03-22 LAB — LIPID PANEL
Cholesterol: 170 mg/dL (ref 0–200)
HDL: 67.2 mg/dL (ref >39.00)
LDL Cholesterol: 83 mg/dL (ref 0–99)
NonHDL: 102.91
Total CHOL/HDL Ratio: 3
Triglycerides: 99 mg/dL (ref 0.0–149.0)
VLDL: 19.8 mg/dL (ref 0.0–40.0)

## 2019-03-22 LAB — TSH: TSH: 3.83 u[IU]/mL (ref 0.35–4.50)

## 2019-03-22 LAB — IBC PANEL
Iron: 79 ug/dL (ref 42–145)
Saturation Ratios: 16.8 % — ABNORMAL LOW (ref 20.0–50.0)
Transferrin: 336 mg/dL (ref 212.0–360.0)

## 2019-03-22 LAB — FERRITIN: Ferritin: 19.4 ng/mL (ref 10.0–291.0)

## 2019-03-22 MED ORDER — EUCRISA 2 % EX OINT: 1.0000 | TOPICAL_OINTMENT | Freq: Two times a day (BID) | CUTANEOUS | 2 refills | Status: DC

## 2019-03-22 NOTE — Progress Notes (Signed)
Subjective:  Patient ID: Katie Stout, female    DOB: Dec 13, 1945  Age: 73 y.o. MRN: [id number]  CC: Rash, Hyperlipidemia, and Hypertension   HPI Katie Stout presents for f/up - She complains of a several month history of itchy rash on the dorsum of both hands.  It is worse on the right than the left.  She has not gotten much symptom relief with Eucerin and hydrocortisone cream.  Outpatient Medications Prior to Visit  Medication Sig Dispense Refill  . ALPRAZolam (XANAX) 0.25 MG tablet Take 1 tablet (0.25 mg total) by mouth at bedtime as needed for sleep. 30 tablet 3  . calcium carbonate (TUMS CALCIUM FOR LIFE BONE) 750 MG chewable tablet Chew 2-4 tablets by mouth daily.      . Cholecalciferol 2000 units TABS Take 1 tablet (2,000 Units total) by mouth daily. 90 tablet 1  . clindamycin (CLEOCIN) 150 MG capsule     . fexofenadine (ALLEGRA) 30 MG tablet Take 30 mg by mouth 2 (two) times daily.    . hyoscyamine (LEVSIN SL) 0.125 MG SL tablet Place 1 tablet (0.125 mg total) under the tongue every 4 (four) hours as needed for cramping. 30 tablet 2  . omeprazole (PRILOSEC) 20 MG capsule TAKE 1 CAPSULE BY MOUTH EVERY DAY 90 capsule 0  . rosuvastatin (CRESTOR) 10 MG tablet TAKE 1 TABLET BY MOUTH EVERY DAY 90 tablet 1  . spironolactone (ALDACTONE) 25 MG tablet TAKE 1 TABLET BY MOUTH EVERY DAY 90 tablet 1  . traMADol (ULTRAM) 50 MG tablet     . azelastine (OPTIVAR) 0.05 % ophthalmic solution Place 1 drop into both eyes as needed.    . doxycycline (VIBRA-TABS) 100 MG tablet Take 1 tablet (100 mg total) by mouth 2 (two) times daily. 20 tablet 0  . fluticasone (FLONASE) 50 MCG/ACT nasal spray Place into both nostrils daily.     No facility-administered medications prior to visit.     ROS Review of Systems  Constitutional: Negative for diaphoresis, fatigue and unexpected weight change.  HENT: Negative.  Negative for trouble swallowing and voice change.   Eyes: Negative for visual  disturbance.  Respiratory: Negative for cough, chest tightness, shortness of breath and wheezing.   Cardiovascular: Negative for chest pain, palpitations and leg swelling.  Gastrointestinal: Negative for abdominal pain, blood in stool, constipation, diarrhea, nausea and vomiting.  Endocrine: Negative.   Genitourinary: Negative.  Negative for difficulty urinating and dysuria.  Musculoskeletal: Negative.  Negative for arthralgias and myalgias.  Skin: Positive for rash. Negative for color change.  Neurological: Negative.  Negative for dizziness, weakness and light-headedness.  Hematological: Negative for adenopathy. Does not bruise/bleed easily.  Psychiatric/Behavioral: Negative.     Objective:  BP (!) 160/80 (BP Location: Left Arm, Patient Position: Sitting, Cuff Size: Normal)   Pulse 80   Temp 98 F (36.7 C) (Oral)   Resp 16   Ht 5' 4.5" (1.638 m)   Wt 189 lb (85.7 kg)   SpO2 96%   BMI 31.94 kg/m   BP Readings from Last 3 Encounters:  03/22/19 (!) 160/80  09/17/18 119/75  09/15/18 (!) 160/90    Wt Readings from Last 3 Encounters:  03/22/19 189 lb (85.7 kg)  09/15/18 191 lb 8 oz (86.9 kg)  09/07/18 194 lb (88 kg)    Physical Exam Vitals signs reviewed.  Constitutional:      Appearance: Normal appearance. She is not ill-appearing or diaphoretic.  HENT:  Nose: Nose normal.     Mouth/Throat:     Mouth: Mucous membranes are moist.  Eyes:     General: No scleral icterus.    Conjunctiva/sclera: Conjunctivae normal.  Neck:     Musculoskeletal: Normal range of motion and neck supple.  Cardiovascular:     Rate and Rhythm: Normal rate and regular rhythm.     Heart sounds: Murmur present. Systolic murmur present with a grade of 2/6. No diastolic murmur. No gallop.   Pulmonary:     Effort: Pulmonary effort is normal.     Breath sounds: No stridor. No wheezing, rhonchi or rales.  Abdominal:     General: Abdomen is flat. Bowel sounds are normal. There is no distension.      Palpations: Abdomen is soft. There is no hepatomegaly or splenomegaly.     Tenderness: There is no abdominal tenderness.  Musculoskeletal:     Right lower leg: No edema.     Left lower leg: No edema.  Lymphadenopathy:     Cervical: No cervical adenopathy.  Skin:    General: Skin is warm.     Coloration: Skin is not pale.     Findings: Rash present.     Comments: On the dorsum of both hands, more noticeable on the right than the left, there are large swaths of erythema with faint scale.  There is no leading edge.  There are no vesicles, pustules, streaking, induration, exudates, or fluctuance.  See photos.  Neurological:     General: No focal deficit present.     Mental Status: She is alert.  Psychiatric:        Mood and Affect: Mood normal.        Behavior: Behavior normal.     Lab Results  Component Value Date   WBC 7.0 03/22/2019   HGB 15.0 03/22/2019   HCT 45.4 03/22/2019   PLT 217.0 03/22/2019   GLUCOSE 87 03/22/2019   CHOL 170 03/22/2019   TRIG 99.0 03/22/2019   HDL 67.20 03/22/2019   LDLDIRECT 132.2 06/10/2012   LDLCALC 83 03/22/2019   ALT 33 03/24/2018   AST 23 03/24/2018   NA 139 03/22/2019   K 4.4 03/22/2019   CL 99 03/22/2019   CREATININE 0.73 03/22/2019   BUN 12 03/22/2019   CO2 30 03/22/2019   TSH 3.83 03/22/2019   HGBA1C 5.9 12/26/2006    Ct Soft Tissue Neck W Contrast  Result Date: 09/17/2017 CLINICAL DATA:  Neurologic dysphonia EXAM: CT NECK WITH CONTRAST TECHNIQUE: Multidetector CT imaging of the neck was performed using the standard protocol following the bolus administration of intravenous contrast. Creatinine was obtained on site at [facility] at [address] Results: Creatinine 0.7 mg/dL. CONTRAST:  Reference EMR COMPARISON:  03/10/2015 FINDINGS: Pharynx and larynx: There is a gas filled structure in the right paraglottic fat consistent with laryngocele, neck not resolved. At time of imaging the sac measured 12 mm. There is a smooth  submucosal soft tissue density nodule symmetrically on the left that is stable. There is mild supraglottic airway distortion, greater on the right. Salivary glands: No inflammation, mass, or stone. Thyroid: 5 mm left thyroid nodule, incidental at this small size. Lymph nodes: None enlarged or abnormal density Vascular: Atherosclerotic calcification of the aorta and cervical carotids. Limited intracranial: Negative Visualized orbits: Bilateral cataract resection. Mastoids and visualized paranasal sinuses: Minor mucosal thickening on the floors of the maxillary sinuses. Secretions present in the right maxillary sinus. Skeleton: No acute or aggressive  finding cervical facet arthropathy with multilevel ankylosis. Thoracic scoliosis. Upper chest: Biapical pleural scarring. IMPRESSION: 1. 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 2. Symmetric soft tissue density mass in the left paraglottic fat that favors opacified internal laryngocele, stable from 2016. Electronically Signed   By: Monte Fantasia M.D.   On: 09/17/2017 14:54    Assessment & Plan:   Katie Stout was seen today for rash, hyperlipidemia and hypertension.  Diagnoses and all orders for this visit:  Essential hypertension- Her blood pressure is adequately well controlled. -     Basic metabolic panel; Future  Other iron deficiency anemia- Her H&H are normal now. -     CBC with Differential/Platelet; Future -     IBC panel; Future -     Ferritin; Future  Hyperlipidemia with target LDL less than 130- She has achieved her LDL goal and is doing well on the statin. -     Lipid panel; Future -     TSH; Future  Vitamin D deficiency  Acute hand eczema- I recommended that she treat this with [place]. -     Crisaborole (EUCRISA) 2 % OINT; Apply 1 Act topically 2 (two) times daily.  Need for influenza vaccination -     Flu Vaccine QUAD High Dose(Fluad)   I have discontinued Katie Stout's fluticasone, azelastine, and doxycycline. I am also having her start on [place]. Additionally, I am having her maintain her calcium carbonate, fexofenadine, Cholecalciferol, hyoscyamine, ALPRAZolam, rosuvastatin, spironolactone, omeprazole, clindamycin, and traMADol.  Meds ordered this encounter  Medications  . Crisaborole (EUCRISA) 2 % OINT    Sig: Apply 1 Act topically 2 (two) times daily.    Dispense:  100 g    Refill:  2     Follow-up: Return in about 6 months (around 09/19/2019).  Katie Calico, MD

## 2019-03-22 NOTE — Patient Instructions (Addendum)
Dyshidrotic Eczema Dyshidrotic eczema (pompholyx) is a type of eczema that causes very itchy (pruritic), fluid-filled blisters (vesicles) to form on the hands and feet. It can affect people of any age, but is more common before the age of 40. There is no cure, but treatment and certain lifestyle changes can help relieve symptoms. What are the causes? The cause of this condition is not known. What increases the risk? You are more likely to develop this condition if:  You wash your hands frequently.  You have a personal history or family history of eczema, allergies, asthma, or hay fever.  You are allergic to metals such as nickel or cobalt.  You work with cement.  You smoke. What are the signs or symptoms? Symptoms of this condition may affect the hands, feet, or both. Symptoms may come and go (recur), and may include:  Severe itching, which may happen before blisters appear.  Blisters. These may form suddenly. ? In the early stages, blisters may form near the fingertips. ? In severe cases, blisters may grow to large blister masses (bullae). ? Blisters resolve in 2-3 weeks without bursting. This is followed by a dry phase in which itching eases.  Pain and swelling.  Cracks or long, narrow openings (fissures) in the skin.  Severe dryness.  Ridges on the nails. How is this diagnosed? This condition may be diagnosed based on:  A physical exam.  Your symptoms.  Your medical history.  Skin scrapings to rule out a fungal infection.  Testing a swab of fluid for bacteria (culture).  Removing and checking a small piece of skin (biopsy) in order to test for infection or to rule out other conditions.  Skin patch tests. These tests involve taking patches that contain possible allergens and placing them on your back. Your health care provider will wait a few days and then check to see if an allergic reaction occurred. These tests may be done if your health care provider suspects  allergic reactions, or to rule out other types of eczema. You may be referred to a health care provider who specializes in the skin (dermatologist) to help diagnose and treat this condition. How is this treated? There is no cure for this condition, but treatment can help relieve symptoms. Depending on how many blisters you have and how severe they are, your health care provider may suggest:  Avoiding allergens, irritants, or triggers that worsen symptoms. This may involve lifestyle changes such as: ? Using different lotions or soaps. ? Avoiding hot weather or places that will cause you to sweat a lot. ? Managing stress with coping techniques such as relaxation and exercise, and asking for help when you need it. ? Diet changes as recommended by your health care provider.  Using a clean, damp towel (cool compress) to relieve symptoms.  Soaking in a bath that contains a type of salt that relieves irritation (aluminum acetate soaks).  Medicine taken by mouth to reduce itching (oral antihistamines).  Medicine applied to the skin to reduce swelling and irritation (topical corticosteroids).  Medicine that reduces the activity of the body's disease-fighting system (immunosuppressants) to treat inflammation. This may be given in severe cases.  Antibiotic medicines to treat bacterial infection.  Light therapy (phototherapy). This involves shining ultraviolet (UV) light on affected skin in order to reduce itchiness and inflammation. Follow these instructions at home: Bathing and skin care   Wash skin gently. After bathing or washing your hands, pat your skin dry. Avoid rubbing your skin.  Remove   all jewelry before bathing. If the skin under the jewelry stays wet, blisters may form or get worse.  Apply cool compresses as told by your health care provider: ? Soak a clean towel in cool water. ? Wring out excess water until towel is damp. ? Place the towel over affected skin. Leave the towel on  for 20 minutes at a time, 2-3 times a day.  Use mild soaps, cleansers, and lotions that do not contain dyes, perfumes, or other irritants.  Keep your skin hydrated. To do this: ? Avoid very hot water. Take lukewarm baths or showers. ? Apply moisturizer within three minutes of bathing. This locks in moisture. Medicines  Take and apply over-the-counter and prescription medicines only as told by your health care provider.  If you were prescribed antibiotic medicine, take or apply it as told by your health care provider. Do not stop using the antibiotic even if you start to feel better. General instructions  Identify and avoid triggers and allergens.  Keep fingernails short to avoid breaking open the skin while scratching.  Use waterproof gloves to protect your hands when doing work that keeps your hands wet for a long time.  Wear socks to keep your feet dry.  Do not use any products that contain nicotine or tobacco, such as cigarettes and e-cigarettes. If you need help quitting, ask your health care provider.  Keep all follow-up visits as told by your health care provider. This is important. Contact a health care provider if:  You have symptoms that do not go away.  You have signs of infection, such as: ? Crusting, pus, or a bad smell. ? More redness, swelling, or pain. ? Increased warmth in the affected area. Summary  Dyshidrotic eczema (pompholyx) is a type of eczema that causes very itchy (pruritic), fluid-filled blisters (vesicles) to form on the hands and feet.  The cause of this condition is not known.  There is no cure for this condition, but treatment can help relieve symptoms. Treatment depends on how many blisters you have and how severe they are.  Use mild soaps, cleansers, and lotions that do not contain dyes, perfumes, or other irritants. Keep your skin hydrated. This information is not intended to replace advice given to you by your health care provider. Make sure  you discuss any questions you have with your health care provider. Document Released: 11/07/2016 Document Revised: 10/14/2018 Document Reviewed: 11/07/2016 Elsevier Patient Education  [place].  Atopic Dermatitis Atopic dermatitis is a skin disorder that causes inflammation of the skin. This is the most common type of eczema. Eczema is a group of skin conditions that cause the skin to be itchy, red, and swollen. This condition is generally worse during the cooler winter months and often improves during the warm summer months. Symptoms can vary from person to person. Atopic dermatitis usually starts showing signs in infancy and can last through adulthood. This condition cannot be passed from one person to another (non-contagious), but it is more common in families. Atopic dermatitis may not always be present. When it is present, it is called a flare-up. What are the causes? The exact cause of this condition is not known. Flare-ups of the condition may be triggered by:  Contact with something that you are sensitive or allergic to.  Stress.  Certain foods.  Extremely hot or cold weather.  Harsh chemicals and soaps.  Dry air.  Chlorine. What increases the risk? This condition is more likely to develop in people  who have a personal history or family history of eczema, allergies, asthma, or hay fever. What are the signs or symptoms? Symptoms of this condition include:  Dry, scaly skin.  Red, itchy rash.  Itchiness, which can be severe. This may occur before the skin rash. This can make sleeping difficult.  Skin thickening and cracking that can occur over time. How is this diagnosed? This condition is diagnosed based on your symptoms, a medical history, and a physical exam. How is this treated? There is no cure for this condition, but symptoms can usually be controlled. Treatment focuses on:  Controlling the itchiness and scratching. You may be given medicines, such as  antihistamines or steroid creams.  Limiting exposure to things that you are sensitive or allergic to (allergens).  Recognizing situations that cause stress and developing a plan to manage stress. If your atopic dermatitis does not get better with medicines, or if it is all over your body (widespread), a treatment using a specific type of light (phototherapy) may be used. Follow these instructions at home: Skin care   Keep your skin well-moisturized. Doing this seals in moisture and helps to prevent dryness. ? Use unscented lotions that have petroleum in them. ? Avoid lotions that contain alcohol or water. They can dry the skin.  Keep baths or showers short (less than 5 minutes) in warm water. Do not use hot water. ? Use mild, unscented cleansers for bathing. Avoid soap and bubble bath. ? Apply a moisturizer to your skin right after a bath or shower.  Do not apply anything to your skin without checking with your health care provider. General instructions  Dress in clothes made of cotton or cotton blends. Dress lightly because heat increases itchiness.  When washing your clothes, rinse your clothes twice so all of the soap is removed.  Avoid any triggers that can cause a flare-up.  Try to manage your stress.  Keep your fingernails cut short.  Avoid scratching. Scratching makes the rash and itchiness worse. It may also result in a skin infection (impetigo) due to a break in the skin caused by scratching.  Take or apply over-the-counter and prescription medicines only as told by your health care provider.  Keep all follow-up visits as told by your health care provider. This is important.  Do not be around people who have cold sores or fever blisters. If you get the infection, it may cause your atopic dermatitis to worsen. Contact a health care provider if:  Your itchiness interferes with sleep.  Your rash gets worse or it is not better within one week of starting treatment.   You have a fever.  You have a rash flare-up after having contact with someone who has cold sores or fever blisters. Get help right away if:  You develop pus or soft yellow scabs in the rash area. Summary  This condition causes a red rash and itchy, dry, scaly skin.  Treatment focuses on controlling the itchiness and scratching, limiting exposure to things that you are sensitive or allergic to (allergens), recognizing situations that cause stress, and developing a plan to manage stress.  Keep your skin well-moisturized.  Keep baths or showers shorter than 5 minutes and use warm water. Do not use hot water. This information is not intended to replace advice given to you by your health care provider. Make sure you discuss any questions you have with your health care provider. Document Released: 06/21/2000 Document Revised: 10/13/2018 Document Reviewed: 07/26/2016 Elsevier Patient Education  2020 Elsevier Inc.  

## 2019-03-24 ENCOUNTER — Encounter: Payer: Self-pay | Admitting: Internal Medicine

## 2019-03-24 ENCOUNTER — Telehealth: Payer: Self-pay

## 2019-03-24 NOTE — Telephone Encounter (Addendum)
Key: [id number]   Left detailed message for pt informing PA was approved.

## 2019-03-28 ENCOUNTER — Encounter

## 2019-04-02 ENCOUNTER — Ambulatory Visit: Payer: Medicare Other | Admitting: Neurology

## 2019-04-20 DIAGNOSIS — L299 Pruritus, unspecified: Secondary | ICD-10-CM | POA: Diagnosis not present

## 2019-04-20 DIAGNOSIS — L259 Unspecified contact dermatitis, unspecified cause: Secondary | ICD-10-CM | POA: Diagnosis not present

## 2019-06-10 ENCOUNTER — Encounter: Payer: Self-pay | Admitting: Internal Medicine

## 2019-06-13 ENCOUNTER — Other Ambulatory Visit: Payer: Self-pay | Admitting: Internal Medicine

## 2019-06-13 DIAGNOSIS — E876 Hypokalemia: Secondary | ICD-10-CM

## 2019-06-13 DIAGNOSIS — E559 Vitamin D deficiency, unspecified: Secondary | ICD-10-CM

## 2019-06-13 DIAGNOSIS — I10 Essential (primary) hypertension: Secondary | ICD-10-CM

## 2019-06-13 DIAGNOSIS — E785 Hyperlipidemia, unspecified: Secondary | ICD-10-CM

## 2019-07-13 ENCOUNTER — Encounter: Payer: Self-pay | Admitting: *Deleted

## 2019-07-13 NOTE — Progress Notes (Addendum)
Currently [employer] is verifying benefits and will let us know if PA needed.  I called medicare provider line (for [place] it is [employer]) [phone] option 3 then2 spoke with Venus ref# [id number] PA not needed if taking place in a doctor's office. I asked for a copy of the policy and she said to go to [email or web address] and on the right side there is a tab labeled: outpatient department PA  Received fax from [facility] and sent it to scan into her chart. [id number] and [phone] covered per insurer guidelines and must be buy and bill

## 2019-07-16 ENCOUNTER — Ambulatory Visit: Payer: Medicare Other | Admitting: Neurology

## 2019-07-22 ENCOUNTER — Encounter: Payer: Self-pay | Admitting: Internal Medicine

## 2019-07-30 ENCOUNTER — Ambulatory Visit (INDEPENDENT_AMBULATORY_CARE_PROVIDER_SITE_OTHER): Payer: Medicare Other | Admitting: Neurology

## 2019-07-30 ENCOUNTER — Other Ambulatory Visit: Payer: Self-pay

## 2019-07-30 DIAGNOSIS — G243 Spasmodic torticollis: Secondary | ICD-10-CM | POA: Diagnosis not present

## 2019-07-30 MED ORDER — ONABOTULINUMTOXINA 100 UNITS IJ SOLR
250.0000 [IU] | Freq: Once | INTRAMUSCULAR | Status: AC
  Administered 2019-07-30: 12:00:00 250 [IU] via INTRAMUSCULAR

## 2019-07-30 NOTE — Procedures (Signed)
[facility]   Procedure Note Botox  Attending: Dr. Wells Guiles Mady Oubre  Preoperative Diagnosis(es): Cervical Dystonia  Result History  Onset of effect: unknown Clinical results:  Worked so well that she cancelled last visit (has done that previously); now neck stiff and tight Adverse Effects: no dysphagia  Consent obtained from: The patient Benefits discussed included, but were not limited to decreased muscle tightness, increased joint range of motion, and decreased pain.  Risk discussed included, but were not limited pain and discomfort, bleeding, bruising, excessive weakness, venous thrombosis, muscle atrophy and dysphagia.  A copy of the patient medication guide was given to the patient which explains the blackbox warning.  Patients identity and treatment sites confirmed Yes.  .  Details of Procedure: Skin was cleaned with alcohol.  A 30 gauge, 1/2 inch needle was introduced to the target muscle (except splenius capitus, posterior approach, where 27 inch, 1 1/2 gauge needle was used).  Prior to injection, the needle plunger was aspirated to make sure the needle was not within a blood vessel.  There was no blood retrieved on aspiration.    Following is a summary of the muscles injected  And the amount of Botulinum toxin used:   Dilution 0.9% preservative free saline mixed with 100 u Botox type A to make 10 U per 0.1cc  Injections  Location Left  Right Units Number of sites        Sternocleidomastoid 70  70 1  Splenius Capitus, posterior approach  80 80 1  Splenius Capitus, lateral approach  40 40 1  Levator Scapulae      Trapezius 20/20/10  50 3        TOTAL UNITS:   240    Agent: Botulinum Type A ( Onobotulinum Toxin type A ).  2 vials of Botox were used, each containing 100 units and freshly diluted with 1 mL of sterile, non-preserved saline   Total injected (Units): 240  Total wasted (Units): 10   Pt tolerated procedure well without complications.   Reinjection is  anticipated in 3 months.

## 2019-08-05 ENCOUNTER — Other Ambulatory Visit: Payer: Self-pay | Admitting: Internal Medicine

## 2019-08-05 DIAGNOSIS — F411 Generalized anxiety disorder: Secondary | ICD-10-CM

## 2019-08-20 ENCOUNTER — Other Ambulatory Visit: Payer: Self-pay | Admitting: Internal Medicine

## 2019-09-06 ENCOUNTER — Ambulatory Visit: Payer: Medicare Other | Attending: Internal Medicine

## 2019-09-06 DIAGNOSIS — Z23 Encounter for immunization: Secondary | ICD-10-CM | POA: Insufficient documentation

## 2019-09-06 NOTE — Progress Notes (Signed)
   Covid-19 Vaccination Clinic  Name:  LIZZET VERSTRAETE    MRN: [id number] DOB: 1945-09-08  09/06/2019  Ms. Lechtenberg was observed post Covid-19 immunization for 15 minutes without incidence. She was provided with Vaccine Information Sheet and instruction to access the V-Safe system.   Ms. Jansky was instructed to call 911 with any severe reactions post vaccine: [name] Difficulty breathing  . Swelling of your face and throat  . A fast heartbeat  . A bad rash all over your body  . Dizziness and weakness    Immunizations Administered    Name Date Dose VIS Date Route   Pfizer COVID-19 Vaccine 09/06/2019 11:18 AM 0.3 mL 06/18/2019 Intramuscular   Manufacturer: [place]   Lot: [id number]   [place]: [id number]

## 2019-09-20 ENCOUNTER — Encounter: Payer: Medicare Other | Admitting: Internal Medicine

## 2019-09-21 ENCOUNTER — Encounter: Payer: Medicare Other | Admitting: Internal Medicine

## 2019-10-05 ENCOUNTER — Ambulatory Visit: Payer: Medicare Other | Attending: Internal Medicine

## 2019-10-05 DIAGNOSIS — Z23 Encounter for immunization: Secondary | ICD-10-CM

## 2019-10-05 NOTE — Progress Notes (Signed)
   Covid-19 Vaccination Clinic  Name:  ISOLA KELTZ    MRN: [id number] DOB: [birth year]  10/05/2019  Ms. Fuster was observed post Covid-19 immunization for 15 minutes without incident. She was provided with Vaccine Information Sheet and instruction to access the V-Safe system.   Ms. Lykes was instructed to call 911 with any severe reactions post vaccine: [name] Difficulty breathing  . Swelling of face and throat  . A fast heartbeat  . A bad rash all over body  . Dizziness and weakness   Immunizations Administered    Name Date Dose VIS Date Route   Pfizer COVID-19 Vaccine 10/05/2019 11:33 AM 0.3 mL 06/18/2019 Intramuscular   Manufacturer: [place]   Lot: [id number]   [place]: [id number]

## 2019-10-11 ENCOUNTER — Ambulatory Visit (INDEPENDENT_AMBULATORY_CARE_PROVIDER_SITE_OTHER): Payer: Medicare Other | Admitting: Internal Medicine

## 2019-10-11 ENCOUNTER — Encounter: Payer: Self-pay | Admitting: Internal Medicine

## 2019-10-11 ENCOUNTER — Other Ambulatory Visit: Payer: Self-pay

## 2019-10-11 VITALS — BP 152/86 | HR 90 | Temp 98.2°F | Resp 16 | Ht 64.0 in | Wt 199.0 lb

## 2019-10-11 DIAGNOSIS — E559 Vitamin D deficiency, unspecified: Secondary | ICD-10-CM | POA: Diagnosis not present

## 2019-10-11 DIAGNOSIS — I10 Essential (primary) hypertension: Secondary | ICD-10-CM

## 2019-10-11 DIAGNOSIS — D508 Other iron deficiency anemias: Secondary | ICD-10-CM | POA: Diagnosis not present

## 2019-10-11 LAB — CBC WITH DIFFERENTIAL/PLATELET
Basophils Absolute: 0 10*3/uL (ref 0.0–0.1)
Basophils Relative: 0.8 % (ref 0.0–3.0)
Eosinophils Absolute: 0.5 10*3/uL (ref 0.0–0.7)
Eosinophils Relative: 8.5 % — ABNORMAL HIGH (ref 0.0–5.0)
HCT: 44.1 % (ref 36.0–46.0)
Hemoglobin: 14.9 g/dL (ref 12.0–15.0)
Lymphocytes Relative: 14.4 % (ref 12.0–46.0)
Lymphs Abs: 0.8 10*3/uL (ref 0.7–4.0)
MCHC: 33.8 g/dL (ref 30.0–36.0)
MCV: 89.9 fl (ref 78.0–100.0)
Monocytes Absolute: 0.4 10*3/uL (ref 0.1–1.0)
Monocytes Relative: 6.8 % (ref 3.0–12.0)
Neutro Abs: 4 10*3/uL (ref 1.4–7.7)
Neutrophils Relative %: 69.5 % (ref 43.0–77.0)
Platelets: 217 10*3/uL (ref 150.0–400.0)
RBC: 4.91 Mil/uL (ref 3.87–5.11)
RDW: 14.5 % (ref 11.5–15.5)
WBC: 5.8 10*3/uL (ref 4.0–10.5)

## 2019-10-11 LAB — BASIC METABOLIC PANEL
BUN: 11 mg/dL (ref 6–23)
CO2: 31 mEq/L (ref 19–32)
Calcium: 9.7 mg/dL (ref 8.4–10.5)
Chloride: 100 mEq/L (ref 96–112)
Creatinine, Ser: 0.75 mg/dL (ref 0.40–1.20)
GFR: 75.46 mL/min (ref >60.00)
Glucose, Bld: 103 mg/dL — ABNORMAL HIGH (ref 70–99)
Potassium: 4.5 mEq/L (ref 3.5–5.1)
Sodium: 137 mEq/L (ref 135–145)

## 2019-10-11 LAB — VITAMIN D 25 HYDROXY (VIT D DEFICIENCY, FRACTURES): VITD: 45.96 ng/mL (ref 30.00–100.00)

## 2019-10-11 NOTE — Progress Notes (Signed)
Subjective:  Patient ID: Katie Stout, female    DOB: 1946/05/02  Age: 74 y.o. MRN: [id number]  CC: Hypertension  This visit occurred during the SARS-CoV-2 public health emergency.  Safety protocols were in place, including screening questions prior to the visit, additional usage of staff PPE, and extensive cleaning of exam room while observing appropriate contact time as indicated for disinfecting solutions.    HPI Beni Spurgeon Judice presents for f/up - She checks her blood pressure at home and tells me it is always well controlled.  She denies any recent episodes of headache, blurred vision, chest pain, shortness of breath, palpitations, edema, or fatigue.  She complains of weight gain.  Outpatient Medications Prior to Visit  Medication Sig Dispense Refill  . ALPRAZolam (XANAX) 0.25 MG tablet TAKE 1 TABLET (0.25 MG TOTAL) BY MOUTH AT BEDTIME AS NEEDED FOR SLEEP. 30 tablet 5  . calcium carbonate (TUMS CALCIUM FOR LIFE BONE) 750 MG chewable tablet Chew 2-4 tablets by mouth daily.      . Cholecalciferol 2000 units TABS Take 1 tablet (2,000 Units total) by mouth daily. 90 tablet 1  . fexofenadine (ALLEGRA) 30 MG tablet Take 30 mg by mouth 2 (two) times daily.    . halobetasol (ULTRAVATE) 0.05 % cream     . hyoscyamine (LEVSIN SL) 0.125 MG SL tablet PLACE 1 TABLET (0.125 MG TOTAL) UNDER THE TONGUE EVERY 4 (FOUR) HOURS AS NEEDED FOR CRAMPING. 30 tablet 1  . rosuvastatin (CRESTOR) 10 MG tablet TAKE 1 TABLET BY MOUTH EVERY DAY 90 tablet 1  . spironolactone (ALDACTONE) 25 MG tablet TAKE 1 TABLET BY MOUTH EVERY DAY 90 tablet 1  . clindamycin (CLEOCIN) 150 MG capsule     . Crisaborole (EUCRISA) 2 % OINT Apply 1 Act topically 2 (two) times daily. 100 g 2  . omeprazole (PRILOSEC) 20 MG capsule TAKE 1 CAPSULE BY MOUTH EVERY DAY 90 capsule 0  . traMADol (ULTRAM) 50 MG tablet      No facility-administered medications prior to visit.    ROS Review of Systems  Constitutional: Positive for  unexpected weight change. Negative for diaphoresis and fatigue.  HENT: Negative.   Eyes: Negative for visual disturbance.  Respiratory: Negative for cough, chest tightness, shortness of breath and wheezing.   Cardiovascular: Negative for chest pain, palpitations and leg swelling.  Gastrointestinal: Negative for abdominal pain, constipation, diarrhea, nausea and vomiting.  Endocrine: Negative.   Genitourinary: Negative.  Negative for difficulty urinating and dysuria.  Musculoskeletal: Negative for arthralgias and myalgias.  Skin: Negative.  Negative for color change.  Neurological: Negative.  Negative for dizziness, weakness and light-headedness.  Hematological: Negative for adenopathy. Does not bruise/bleed easily.  Psychiatric/Behavioral: Negative.     Objective:  BP (!) 152/86 (BP Location: Left Arm, Patient Position: Sitting, Cuff Size: Large)   Pulse 90   Temp 98.2 F (36.8 C) (Oral)   Resp 16   Ht 5\' 4"  (1.626 m)   Wt 199 lb (90.3 kg)   SpO2 99%   BMI 34.16 kg/m   BP Readings from Last 3 Encounters:  10/11/19 (!) 152/86  03/22/19 (!) 160/80  09/17/18 119/75    Wt Readings from Last 3 Encounters:  10/11/19 199 lb (90.3 kg)  03/22/19 189 lb (85.7 kg)  09/15/18 191 lb 8 oz (86.9 kg)    Physical Exam Vitals reviewed.  HENT:     Nose: Nose normal.     Mouth/Throat:     Mouth: Mucous membranes are moist.  Eyes:     General: No scleral icterus.    Conjunctiva/sclera: Conjunctivae normal.  Cardiovascular:     Rate and Rhythm: Normal rate and regular rhythm.     Heart sounds: No murmur.  Pulmonary:     Effort: Pulmonary effort is normal.     Breath sounds: No stridor. No wheezing, rhonchi or rales.  Abdominal:     General: Abdomen is flat. Bowel sounds are normal. There is no distension.     Palpations: Abdomen is soft. There is no hepatomegaly, splenomegaly or mass.     Tenderness: There is no abdominal tenderness.  Musculoskeletal:        General: Normal range  of motion.     Cervical back: Neck supple.     Right lower leg: No edema.     Left lower leg: No edema.  Lymphadenopathy:     Cervical: No cervical adenopathy.  Skin:    General: Skin is warm and dry.  Neurological:     General: No focal deficit present.     Mental Status: She is alert.  Psychiatric:        Mood and Affect: Mood normal.        Behavior: Behavior normal.     Lab Results  Component Value Date   WBC 5.8 10/11/2019   HGB 14.9 10/11/2019   HCT 44.1 10/11/2019   PLT 217.0 10/11/2019   GLUCOSE 103 (H) 10/11/2019   CHOL 170 03/22/2019   TRIG 99.0 03/22/2019   HDL 67.20 03/22/2019   LDLDIRECT 132.2 06/10/2012   LDLCALC 83 03/22/2019   ALT 33 03/24/2018   AST 23 03/24/2018   NA 137 10/11/2019   K 4.5 10/11/2019   CL 100 10/11/2019   CREATININE 0.75 10/11/2019   BUN 11 10/11/2019   CO2 31 10/11/2019   TSH 3.83 03/22/2019   HGBA1C 5.9 12/26/2006    CT SOFT TISSUE NECK W CONTRAST  Result Date: 09/17/2017 CLINICAL DATA:  Neurologic dysphonia EXAM: CT NECK WITH CONTRAST TECHNIQUE: Multidetector CT imaging of the neck was performed using the standard protocol following the bolus administration of intravenous contrast. Creatinine was obtained on site at [facility] at [address] Results: Creatinine 0.7 mg/dL. CONTRAST:  Reference EMR COMPARISON:  03/10/2015 FINDINGS: Pharynx and larynx: There is a gas filled structure in the right paraglottic fat consistent with laryngocele, neck not resolved. At time of imaging the sac measured 12 mm. There is a smooth submucosal soft tissue density nodule symmetrically on the left that is stable. There is mild supraglottic airway distortion, greater on the right. Salivary glands: No inflammation, mass, or stone. Thyroid: 5 mm left thyroid nodule, incidental at this small size. Lymph nodes: None enlarged or abnormal density Vascular: Atherosclerotic calcification of the aorta and cervical carotids. Limited intracranial:  Negative Visualized orbits: Bilateral cataract resection. Mastoids and visualized paranasal sinuses: Minor mucosal thickening on the floors of the maxillary sinuses. Secretions present in the right maxillary sinus. Skeleton: No acute or aggressive finding cervical facet arthropathy with multilevel ankylosis. Thoracic scoliosis. Upper chest: Biapical pleural scarring. IMPRESSION: 1. 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 2. Symmetric soft tissue density mass in the left paraglottic fat that favors opacified internal laryngocele, stable from 2016. Electronically Signed   By: Monte Fantasia M.D.   On: 09/17/2017 14:54    Assessment & Plan:   Hessie was seen today for hypertension.  Diagnoses and all orders for  this visit:  Essential hypertension- She tells me her blood pressure at home is well controlled but it is elevated here.  This is clearly a whitecoat phenomenon.  Her electrolytes and renal function are normal.  Will continue the current dose of spironolactone. -     CBC with Differential/Platelet; Future -     Basic metabolic panel; Future -     Basic metabolic panel -     CBC with Differential/Platelet  Vitamin D deficiency- Her vitamin D level is normal. -     VITAMIN D 25 Hydroxy (Vit-D Deficiency, Fractures); Future -     VITAMIN D 25 Hydroxy (Vit-D Deficiency, Fractures)  Other iron deficiency anemia- Her H&H are normal now. -     CBC with Differential/Platelet; Future -     CBC with Differential/Platelet   I have discontinued Harriet Masson. Rego's omeprazole, clindamycin, traMADol, and Eucrisa. I am also having her maintain her calcium carbonate, fexofenadine, Cholecalciferol, rosuvastatin, spironolactone, ALPRAZolam, hyoscyamine, and halobetasol.  No orders of the defined types were placed in this encounter.    Follow-up: No follow-ups on file.  [name], MD

## 2019-10-14 ENCOUNTER — Encounter: Payer: Self-pay | Admitting: Internal Medicine

## 2019-10-14 NOTE — Patient Instructions (Signed)

## 2019-10-18 ENCOUNTER — Encounter: Payer: Self-pay | Admitting: Internal Medicine

## 2019-10-19 ENCOUNTER — Other Ambulatory Visit: Payer: Self-pay

## 2019-10-19 ENCOUNTER — Ambulatory Visit (INDEPENDENT_AMBULATORY_CARE_PROVIDER_SITE_OTHER): Payer: Medicare Other | Admitting: Internal Medicine

## 2019-10-19 ENCOUNTER — Encounter: Payer: Self-pay | Admitting: Internal Medicine

## 2019-10-19 DIAGNOSIS — J069 Acute upper respiratory infection, unspecified: Secondary | ICD-10-CM

## 2019-10-19 MED ORDER — ALBUTEROL SULFATE HFA 108 (90 BASE) MCG/ACT IN AERS: 2.0000 | INHALATION_SPRAY | RESPIRATORY_TRACT | 1 refills | Status: DC | PRN

## 2019-10-19 MED ORDER — AZITHROMYCIN 250 MG PO TABS: ORAL_TABLET | ORAL | 0 refills | Status: DC

## 2019-10-19 NOTE — Progress Notes (Signed)
Virtual Visit via Video Note  I connected with Katie Stout on 10/19/19 at 10:00 AM EDT by a video enabled telemedicine application and verified that I am speaking with the correct person using two identifiers.   I discussed the limitations of evaluation and management by telemedicine and the availability of in person appointments. The patient expressed understanding and agreed to proceed.  Present for the visit:  Myself, Dr Billey Gosling, Margit Hanks.  The patient is currently at home and I am in the office.    No referring provider.    History of Present Illness: This is an acute visit for congestion.  Her symptoms started 3 days ago.  Her grandson had a runny nose the day before.  She initially thought she had allergies, but her symptoms change or are not typical of her allergies.   She had a sore throat that was severe.  She then had sinus pain over her eyes and around her eyes.  She then had sneezing, runny nose, nasal congestion, cough, last night it felt like it was from her chest and she was able to get up a little phlegm.  She feels weak.  She has scalp chills/goosebumps on her scalp, which is typical when she has a cold.  She has headaches.  She has had some tight feeling in her chest when she coughs.  She denies any wheezing.  She had a little shortness of breath last night.   She denies fever - 98.1 this am.    She takes Human resources officer.  She started afrin nasal spray yesterday.  She took airborne. She uses honey lozenges.   Her symptoms did feel better after taking Airborne.   She has had two covid vaccines.     Review of Systems  Constitutional: Positive for chills and malaise/fatigue. Negative for fever.  HENT: Positive for congestion, ear pain, sinus pain and sore throat.        Sneezing  Respiratory: Positive for cough (associated with chest tightness), sputum production (once last night) and shortness of breath (last night). Negative for wheezing.   Cardiovascular:  Negative for chest pain.  Musculoskeletal: Positive for joint pain. Negative for myalgias.  Neurological: Positive for headaches. Negative for dizziness.      Social History   Socioeconomic History  . Marital status: Widowed    Spouse name: Not on file  . Number of children: 1  . Years of education: Not on file  . Highest education level: Not on file  Occupational History  . Occupation: retired  Tobacco Use  . Smoking status: Never Smoker  . Smokeless tobacco: Never Used  Substance and Sexual Activity  . Alcohol use: No    Alcohol/week: 0.0 standard drinks  . Drug use: No  . Sexual activity: Not on file  Other Topics Concern  . Not on file  Social History Narrative   HSG   Married '68-22 years; Divorced   1 Daughter - '85; no grandchildren   Work: PT for [employer]   Lives: Alone, but adjoins daughter   Social Determinants of Radio broadcast assistant Strain:   . Difficulty of Paying Living Expenses:   Food Insecurity:   . Worried About Charity fundraiser in the Last Year:   . Arboriculturist in the Last Year:   Transportation Needs:   . Film/video editor (Medical):   [name] Lack of Transportation (Non-Medical):   Physical Activity:   . Days of Exercise per Week:   .  Minutes of Exercise per Session:   Stress:   . Feeling of Stress :   Social Connections:   . Frequency of Communication with Friends and Family:   . Frequency of Social Gatherings with Friends and Family:   . Attends Religious Services:   . Active Member of Clubs or Organizations:   . 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 Breathing normally, speaking in full sentences Skin appears warm and dry  Assessment and Plan:  URI: Likely viral upper respiratory infection, less likely bacterial in nature Discussed with her that it is early in the course of her illness and it is difficult to  know for sure whether this is viral or bacterial She does have seasonal allergies, but this is not typical of her seasonal allergies and most likely is an infection Continue Allegra Will start albuterol inhaler as needed-she has used this in the past and is having some symptoms in her chest Continue current cold medications, increase rest and fluids She will give the above a couple of days to see if this helps If her symptoms get worse we will go ahead and treat with a Z-Pak-I did send this to the pharmacy and advised them to hold it for a couple of days She will call with any questions or concerns  She did have both of her Covid vaccines and I do not think Covid is a concern   Follow Up Instructions:    I discussed the assessment and treatment plan with the patient. The patient was provided an opportunity to ask questions and all were answered. The patient agreed with the plan and demonstrated an understanding of the instructions.   The patient was advised to call back or seek an in-person evaluation if the symptoms worsen or if the condition fails to improve as anticipated.    Binnie Rail, MD

## 2019-10-29 ENCOUNTER — Ambulatory Visit: Payer: Medicare Other | Admitting: Neurology

## 2019-11-13 ENCOUNTER — Other Ambulatory Visit: Payer: Self-pay | Admitting: Internal Medicine

## 2019-11-29 ENCOUNTER — Other Ambulatory Visit: Payer: Self-pay | Admitting: Internal Medicine

## 2019-11-29 DIAGNOSIS — I10 Essential (primary) hypertension: Secondary | ICD-10-CM

## 2019-11-29 DIAGNOSIS — E785 Hyperlipidemia, unspecified: Secondary | ICD-10-CM

## 2019-11-29 DIAGNOSIS — M170 Bilateral primary osteoarthritis of knee: Secondary | ICD-10-CM | POA: Diagnosis not present

## 2019-11-29 DIAGNOSIS — M160 Bilateral primary osteoarthritis of hip: Secondary | ICD-10-CM | POA: Diagnosis not present

## 2019-11-29 DIAGNOSIS — E559 Vitamin D deficiency, unspecified: Secondary | ICD-10-CM

## 2019-11-29 DIAGNOSIS — M545 Low back pain: Secondary | ICD-10-CM | POA: Diagnosis not present

## 2019-11-29 DIAGNOSIS — E876 Hypokalemia: Secondary | ICD-10-CM

## 2019-12-15 ENCOUNTER — Other Ambulatory Visit: Payer: Self-pay | Admitting: Internal Medicine

## 2019-12-15 ENCOUNTER — Encounter: Payer: Self-pay | Admitting: Internal Medicine

## 2019-12-15 ENCOUNTER — Telehealth: Payer: Self-pay | Admitting: Internal Medicine

## 2019-12-15 ENCOUNTER — Ambulatory Visit (INDEPENDENT_AMBULATORY_CARE_PROVIDER_SITE_OTHER): Payer: Medicare Other | Admitting: Internal Medicine

## 2019-12-15 ENCOUNTER — Other Ambulatory Visit: Payer: Self-pay

## 2019-12-15 ENCOUNTER — Ambulatory Visit: Payer: Medicare Other | Admitting: Dermatology

## 2019-12-15 VITALS — BP 166/86 | HR 105 | Temp 99.4°F | Resp 16 | Ht 64.0 in | Wt 195.6 lb

## 2019-12-15 DIAGNOSIS — C4492 Squamous cell carcinoma of skin, unspecified: Secondary | ICD-10-CM

## 2019-12-15 DIAGNOSIS — L989 Disorder of the skin and subcutaneous tissue, unspecified: Secondary | ICD-10-CM | POA: Insufficient documentation

## 2019-12-15 DIAGNOSIS — C44629 Squamous cell carcinoma of skin of left upper limb, including shoulder: Secondary | ICD-10-CM | POA: Diagnosis not present

## 2019-12-15 DIAGNOSIS — L02414 Cutaneous abscess of left upper limb: Secondary | ICD-10-CM | POA: Insufficient documentation

## 2019-12-15 HISTORY — DX: Squamous cell carcinoma of skin, unspecified: C44.92

## 2019-12-15 NOTE — Telephone Encounter (Signed)
   Patient had appointment today Patient wants to know how to care for skin . Should she use ointment of any kind? Please advise

## 2019-12-15 NOTE — Patient Instructions (Signed)
Skin Biopsy, Care After This sheet gives you information about how to care for yourself after your procedure. Your health care provider may also give you more specific instructions. If you have problems or questions, contact your health care provider. What can I expect after the procedure? After the procedure, it is common to have:  Soreness.  Bruising.  Itching. Follow these instructions at home: Biopsy site care Follow instructions from your health care provider about how to take care of your biopsy site. Make sure you:  Wash your hands with soap and water before and after you change your bandage (dressing). If soap and water are not available, use hand sanitizer.  Apply ointment on your biopsy site as directed by your health care provider.  Change your dressing as told by your health care provider.  Leave stitches (sutures), skin glue, or adhesive strips in place. These skin closures may need to stay in place for 2 weeks or longer. If adhesive strip edges start to loosen and curl up, you may trim the loose edges. Do not remove adhesive strips completely unless your health care provider tells you to do that.  If the biopsy area bleeds, apply gentle pressure for 10 minutes. Check your biopsy site every day for signs of infection. Check for:  Redness, swelling, or pain.  Fluid or blood.  Warmth.  Pus or a bad smell.  General instructions  Rest and then return to your normal activities as told by your health care provider.  Take over-the-counter and prescription medicines only as told by your health care provider.  Keep all follow-up visits as told by your health care provider. This is important. Contact a health care provider if:  You have redness, swelling, or pain around your biopsy site.  You have fluid or blood coming from your biopsy site.  Your biopsy site feels warm to the touch.  You have pus or a bad smell coming from your biopsy site.  You have a  fever.  Your sutures, skin glue, or adhesive strips loosen or come off sooner than expected. Get help right away if:  You have bleeding that does not stop with pressure or a dressing. Summary  After the procedure, it is common to have soreness, bruising, and itching at the site.  Follow instructions from your health care provider about how to take care of your biopsy site.  Check your biopsy site every day for signs of infection.  Contact a health care provider if you have redness, swelling, or pain around your biopsy site, or your biopsy site feels warm to the touch.  Keep all follow-up visits as told by your health care provider. This is important. This information is not intended to replace advice given to you by your health care provider. Make sure you discuss any questions you have with your health care provider. Document Revised: 12/22/2017 Document Reviewed: 12/22/2017 Elsevier Patient Education  2020 Elsevier Inc.  

## 2019-12-15 NOTE — Progress Notes (Signed)
Subjective:  Patient ID: Katie Stout, female    DOB: 03-May-1946  Age: 74 y.o. MRN: [id number]  CC: Rash  This visit occurred during the SARS-CoV-2 public health emergency.  Safety protocols were in place, including screening questions prior to the visit, additional usage of staff PPE, and extensive cleaning of exam room while observing appropriate contact time as indicated for disinfecting solutions.    HPI Katie Stout presents for concerns about a lesion on the dorsum of her left forearm.  Its been there for about a month.  It does not bother her.  Outpatient Medications Prior to Visit  Medication Sig Dispense Refill   ALPRAZolam (XANAX) 0.25 MG tablet TAKE 1 TABLET (0.25 MG TOTAL) BY MOUTH AT BEDTIME AS NEEDED FOR SLEEP. 30 tablet 5   calcium carbonate (TUMS CALCIUM FOR LIFE BONE) 750 MG chewable tablet Chew 2-4 tablets by mouth daily.       Cholecalciferol 2000 units TABS Take 1 tablet (2,000 Units total) by mouth daily. 90 tablet 1   fexofenadine (ALLEGRA) 30 MG tablet Take 30 mg by mouth 2 (two) times daily.     halobetasol (ULTRAVATE) 0.05 % cream      hyoscyamine (LEVSIN SL) 0.125 MG SL tablet PLACE 1 TABLET (0.125 MG TOTAL) UNDER THE TONGUE EVERY 4 (FOUR) HOURS AS NEEDED FOR CRAMPING. 30 tablet 1   omeprazole (PRILOSEC) 20 MG capsule TAKE 1 CAPSULE BY MOUTH EVERY DAY 90 capsule 0   rosuvastatin (CRESTOR) 10 MG tablet TAKE 1 TABLET BY MOUTH EVERY DAY 90 tablet 1   spironolactone (ALDACTONE) 25 MG tablet TAKE 1 TABLET BY MOUTH EVERY DAY 90 tablet 1   albuterol (VENTOLIN HFA) 108 (90 Base) MCG/ACT inhaler Inhale 2 puffs into the lungs every 4 (four) hours as needed for wheezing or shortness of breath. 6.7 g 1   azithromycin (ZITHROMAX) 250 MG tablet Take two tabs the first day and then one tab daily for four days 6 tablet 0   No facility-administered medications prior to visit.    ROS Review of Systems  All other systems reviewed and are  negative.   Objective:  BP (!) 166/86 (BP Location: Right Arm, Patient Position: Sitting, Cuff Size: Normal)    Pulse (!) 105    Temp 99.4 F (37.4 C) (Oral)    Resp 16    Ht 5\' 4"  (1.626 m)    Wt 195 lb 9.6 oz (88.7 kg)    SpO2 96%    BMI 33.57 kg/m   BP Readings from Last 3 Encounters:  12/15/19 (!) 166/86  10/11/19 (!) 152/86  03/22/19 (!) 160/80    Wt Readings from Last 3 Encounters:  12/15/19 195 lb 9.6 oz (88.7 kg)  10/11/19 199 lb (90.3 kg)  03/22/19 189 lb (85.7 kg)    Physical Exam Skin:         Lab Results  Component Value Date   WBC 5.8 10/11/2019   HGB 14.9 10/11/2019   HCT 44.1 10/11/2019   PLT 217.0 10/11/2019   GLUCOSE 103 (H) 10/11/2019   CHOL 170 03/22/2019   TRIG 99.0 03/22/2019   HDL 67.20 03/22/2019   LDLDIRECT 132.2 06/10/2012   LDLCALC 83 03/22/2019   ALT 33 03/24/2018   AST 23 03/24/2018   NA 137 10/11/2019   K 4.5 10/11/2019   CL 100 10/11/2019   CREATININE 0.75 10/11/2019   BUN 11 10/11/2019   CO2 31 10/11/2019   TSH 3.83 03/22/2019   HGBA1C  5.9 12/26/2006    CT SOFT TISSUE NECK W CONTRAST  Result Date: 09/17/2017 CLINICAL DATA:  Neurologic dysphonia EXAM: CT NECK WITH CONTRAST TECHNIQUE: Multidetector CT imaging of the neck was performed using the standard protocol following the bolus administration of intravenous contrast. Creatinine was obtained on site at [facility] at [address] Results: Creatinine 0.7 mg/dL. CONTRAST:  Reference EMR COMPARISON:  03/10/2015 FINDINGS: Pharynx and larynx: There is a gas filled structure in the right paraglottic fat consistent with laryngocele, neck not resolved. At time of imaging the sac measured 12 mm. There is a smooth submucosal soft tissue density nodule symmetrically on the left that is stable. There is mild supraglottic airway distortion, greater on the right. Salivary glands: No inflammation, mass, or stone. Thyroid: 5 mm left thyroid nodule, incidental at this small size.  Lymph nodes: None enlarged or abnormal density Vascular: Atherosclerotic calcification of the aorta and cervical carotids. Limited intracranial: Negative Visualized orbits: Bilateral cataract resection. Mastoids and visualized paranasal sinuses: Minor mucosal thickening on the floors of the maxillary sinuses. Secretions present in the right maxillary sinus. Skeleton: No acute or aggressive finding cervical facet arthropathy with multilevel ankylosis. Thoracic scoliosis. Upper chest: Biapical pleural scarring. IMPRESSION: 1. 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 2. Symmetric soft tissue density mass in the left paraglottic fat that favors opacified internal laryngocele, stable from 2016. Electronically Signed   By: Monte Fantasia M.D.   On: 09/17/2017 14:54    After informed VERBAL consent was obtained. using Betadine for cleansing and 2% Lidocaine with epinephrine for anesthetic (1.5 cc's used). With sterile technique a 6 mm punch biopsy was used to obtain a biopsy specimen of the lesion.  There is no exudate.  Hemostasis was obtained by pressure and wound was  Sutured (4.0 Nylon, 1 suture). Antibiotic dressing is applied and wound care instructions provided. Culture sent. The specimen is labeled and sent to pathology for evaluation. The procedure was well tolerated without complications.  Assessment & Plan:   Katie Stout was seen today for rash.  Diagnoses and all orders for this visit:  Skin lesion of left upper extremity- I am concerned for malignancy such as basal cell carcinoma or squamous cell carcinoma.  Specimen sent for pathology.  She will return in 7 to 10 days for suture removal. -     Dermatology pathology; Future -     Dermatology pathology  Abscess of left forearm- Will screen for bacterial infection. -     WOUND CULTURE; Future -     WOUND CULTURE   I have discontinued Dyann Ruddle H. Rigdon's albuterol and azithromycin.  I am also having her maintain her calcium carbonate, fexofenadine, Cholecalciferol, ALPRAZolam, hyoscyamine, halobetasol, omeprazole, rosuvastatin, and spironolactone.  No orders of the defined types were placed in this encounter.    Follow-up: Return in about 1 week (around 12/22/2019).  [name], MD

## 2019-12-18 LAB — WOUND CULTURE: RESULT:: NO GROWTH

## 2019-12-21 ENCOUNTER — Ambulatory Visit: Payer: Medicare Other | Admitting: Internal Medicine

## 2019-12-21 ENCOUNTER — Telehealth: Payer: Self-pay | Admitting: Internal Medicine

## 2019-12-21 NOTE — Telephone Encounter (Signed)
New message:   Pt is calling and states she would like to know the results of her biopsy test. She states she saw something briefly on MyChart but is now unable to get back into it. Please advise.

## 2019-12-22 ENCOUNTER — Ambulatory Visit (INDEPENDENT_AMBULATORY_CARE_PROVIDER_SITE_OTHER): Payer: Medicare Other | Admitting: Internal Medicine

## 2019-12-22 ENCOUNTER — Other Ambulatory Visit: Payer: Self-pay

## 2019-12-22 ENCOUNTER — Encounter: Payer: Self-pay | Admitting: Internal Medicine

## 2019-12-22 VITALS — BP 162/84 | HR 97 | Temp 98.2°F | Ht 64.0 in | Wt 193.4 lb

## 2019-12-22 DIAGNOSIS — D0462 Carcinoma in situ of skin of left upper limb, including shoulder: Secondary | ICD-10-CM

## 2019-12-22 NOTE — Patient Instructions (Signed)
Preventing Skin Cancer, Adult Skin cancer is the most common type of cancer. There are three main types. Squamous cell and basal cell skin cancer are the most common. Melanoma skin cancer is the most dangerous type. Most skin cancers are caused by skin damage from exposure to ultraviolet (UV) light. UV light comes from the sun and from artificial tanning beds. Suntans and sunburns result from exposure to UV light. Skin cancer occurs most often in older people, but it is usually the result of damage done earlier in life. The tans and sunburns you get at any age can lead to skin cancer in the future. To help prevent this, you can take steps to protect yourself. What actions can I take to protect myself from skin cancer? Many people like to get a tan, especially in the summer or when on vacation. However, tan or burned skin is a sign of skin damage. It increases your risk for skin cancer. To lower your risk: Avoid exposure to UV light   Try to stay out of the sun between 10 a.m. and 4 p.m. whenever possible. This is when the sun is at its strongest. Seek the shade during this time.  Remember that you can also be exposed to UV rays on cloudy or hazy days. Sun exposure can be risky year-round, not just in the summer.  Do not use a sunlamp, tanning bed, or tanning booth to get a tan. If you really want a tan, use an artificial tanning lotion.  Avoid getting sunburned. Sunburns are more common on bright sunny days, especially when you are in areas where the sun is reflected off water or snow. Use sunscreen and protective clothing   Always use sunscreen--either a cream, lotion, or spray--when you are out in the sun. Keep sunscreen handy, such as in your gym bag or in your car, so that you will have it when you need it.  Use a sunscreen with a sun protection factor (SPF) of at least 15. Use an SPF of 30 or higher if you are in bright sun, especially when you are out in the snow or on the water.  Make  sure your sunscreen protects you from UVA and UVB light.  Use an adequate amount of sunscreen to cover exposed areas of skin. Put it on 30 minutes before you go out. Reapply it every 2 hours or anytime you come out of the water.  When you are out in the sun, wear a broad-brimmed hat and clothing that covers your arms and legs. Wear wraparound sunglasses. Check your skin for changes  Check your skin often from head to toe to look for any changes in the size, color, or shape of any moles or freckles. Check for any new moles or moles that bleed or become itchy. See your health care provider if you notice changes.  Ask your health care provider about a total skin check. Ask if it should be part of your yearly physical or if you need to see a skin specialist (dermatologist). Take other preventive measures   Avoid exposure to harmful chemicals, such as arsenic. ? Have your home's water tested for arsenic and other chemicals. ? Take protective measures to avoid exposure to chemicals at work.  Do not smoke any tobacco products, such as cigarettes, cigars, pipes, and e-cigarettes. If you need help quitting, ask your health care provider.  Keep your immune system healthy. ? Stay up to date on all vaccines, including the human papillomavirus (HPV) vaccine. ?   Eat at least 5 servings of fruits and vegetables every day. Why are these changes important? About 1 of every 5 people will get skin cancer. The best way to reduce your risk is to avoid skin damage from UV light. If you have teenagers in your house, they should know that just five bad sunburns as a teen could double their risk of skin cancer in the future. If you have younger children, always make sure to protect their skin from the sun. These changes can help reduce your risk of skin cancer, and they will also provide other health benefits, such as the following:  Protecting your skin from the sun can help prevent painful sunburns, sun poisoning,  and other skin damage and blemishes. This is especially important if: ? You have pale white skin, freckles, and red hair. ? You burn easily.  Avoiding exposure to harmful chemicals can help prevent damage to other tissues in your body, such as your lungs, and prevent other types of cancer.  Avoiding smoking tobacco can reduce your risk for other types of cancer and other health problems.  Eating a healthy diet is good for your overall health. What can happen if changes are not made? If you do not make these changes, you will be at higher risk for skin cancer. If you develop skin cancer, the treatments could result in lost time from work and changes in your appearance from scars. The most dangerous type of skin cancer, melanoma, can be deadly if not found early. Where to find support For more support, talk to your primary health care provider or dermatologist. Where to find more information Learn more about skin cancer from:  The Skin Cancer Foundation: www.skincancer.org/prevention  The Centers for Disease Control and Prevention: www.cdc.gov/cancer/skin/  The American Academy of Dermatology: www.aad.org Summary  Skin cancer is the most common type of cancer.  Melanoma skin cancer can be deadly if not found early.  Sunburns and tanning increase your risk for skin cancer.  Protecting your skin from UV light is the best way to prevent skin cancer. This information is not intended to replace advice given to you by your health care provider. Make sure you discuss any questions you have with your health care provider. Document Revised: 10/16/2018 Document Reviewed: 08/18/2017 Elsevier Patient Education  2020 Elsevier Inc.  

## 2019-12-22 NOTE — Progress Notes (Signed)
Subjective:  Patient ID: Katie Stout, female    DOB: 09-30-1945  Age: 74 y.o. MRN: [id number]  CC: Wound Check  This visit occurred during the SARS-CoV-2 public health emergency.  Safety protocols were in place, including screening questions prior to the visit, additional usage of staff PPE, and extensive cleaning of exam room while observing appropriate contact time as indicated for disinfecting solutions.    HPI [name] presents for wound check - she underwent a biopsy of a lesion on her left forearm one week ago. The biopsy was + for SCC. The would has healed well with no symptoms or complications.  Outpatient Medications Prior to Visit  Medication Sig Dispense Refill   ALPRAZolam (XANAX) 0.25 MG tablet TAKE 1 TABLET (0.25 MG TOTAL) BY MOUTH AT BEDTIME AS NEEDED FOR SLEEP. 30 tablet 5   calcium carbonate (TUMS CALCIUM FOR LIFE BONE) 750 MG chewable tablet Chew 2-4 tablets by mouth daily.       Cholecalciferol 2000 units TABS Take 1 tablet (2,000 Units total) by mouth daily. 90 tablet 1   fexofenadine (ALLEGRA) 30 MG tablet Take 30 mg by mouth 2 (two) times daily.     halobetasol (ULTRAVATE) 0.05 % cream      hyoscyamine (LEVSIN SL) 0.125 MG SL tablet PLACE 1 TABLET (0.125 MG TOTAL) UNDER THE TONGUE EVERY 4 (FOUR) HOURS AS NEEDED FOR CRAMPING. 30 tablet 1   omeprazole (PRILOSEC) 20 MG capsule TAKE 1 CAPSULE BY MOUTH EVERY DAY 90 capsule 0   rosuvastatin (CRESTOR) 10 MG tablet TAKE 1 TABLET BY MOUTH EVERY DAY 90 tablet 1   spironolactone (ALDACTONE) 25 MG tablet TAKE 1 TABLET BY MOUTH EVERY DAY 90 tablet 1   No facility-administered medications prior to visit.    ROS Review of Systems  All other systems reviewed and are negative.   Objective:  BP (!) 162/84 (BP Location: Left Arm, Patient Position: Sitting, Cuff Size: Normal)    Pulse 97    Temp 98.2 F (36.8 C) (Oral)    Ht 5\' 4"  (1.626 m)    Wt 193 lb 6 oz (87.7 kg)    SpO2 96%    BMI 33.19 kg/m   BP  Readings from Last 3 Encounters:  12/22/19 (!) 162/84  12/15/19 (!) 166/86  10/11/19 (!) 152/86    Wt Readings from Last 3 Encounters:  12/22/19 193 lb 6 oz (87.7 kg)  12/15/19 195 lb 9.6 oz (88.7 kg)  10/11/19 199 lb (90.3 kg)    Physical Exam Musculoskeletal:       Arms:     Lab Results  Component Value Date   WBC 5.8 10/11/2019   HGB 14.9 10/11/2019   HCT 44.1 10/11/2019   PLT 217.0 10/11/2019   GLUCOSE 103 (H) 10/11/2019   CHOL 170 03/22/2019   TRIG 99.0 03/22/2019   HDL 67.20 03/22/2019   LDLDIRECT 132.2 06/10/2012   LDLCALC 83 03/22/2019   ALT 33 03/24/2018   AST 23 03/24/2018   NA 137 10/11/2019   K 4.5 10/11/2019   CL 100 10/11/2019   CREATININE 0.75 10/11/2019   BUN 11 10/11/2019   CO2 31 10/11/2019   TSH 3.83 03/22/2019   HGBA1C 5.9 12/26/2006    CT SOFT TISSUE NECK W CONTRAST  Result Date: 09/17/2017 CLINICAL DATA:  Neurologic dysphonia EXAM: CT NECK WITH CONTRAST TECHNIQUE: Multidetector CT imaging of the neck was performed using the standard protocol following the bolus administration of intravenous contrast. Creatinine was  obtained on site at [facility] at [address] Results: Creatinine 0.7 mg/dL. CONTRAST:  Reference EMR COMPARISON:  03/10/2015 FINDINGS: Pharynx and larynx: There is a gas filled structure in the right paraglottic fat consistent with laryngocele, neck not resolved. At time of imaging the sac measured 12 mm. There is a smooth submucosal soft tissue density nodule symmetrically on the left that is stable. There is mild supraglottic airway distortion, greater on the right. Salivary glands: No inflammation, mass, or stone. Thyroid: 5 mm left thyroid nodule, incidental at this small size. Lymph nodes: None enlarged or abnormal density Vascular: Atherosclerotic calcification of the aorta and cervical carotids. Limited intracranial: Negative Visualized orbits: Bilateral cataract resection. Mastoids and visualized paranasal  sinuses: Minor mucosal thickening on the floors of the maxillary sinuses. Secretions present in the right maxillary sinus. Skeleton: No acute or aggressive finding cervical facet arthropathy with multilevel ankylosis. Thoracic scoliosis. Upper chest: Biapical pleural scarring. IMPRESSION: 1. 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 2. Symmetric soft tissue density mass in the left paraglottic fat that favors opacified internal laryngocele, stable from 2016. Electronically Signed   By: Monte Fantasia M.D.   On: 09/17/2017 14:54    Assessment & Plan:   Katie Stout was seen today for wound check.  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  I have asked her to follow-up with dermatology to see if there are any additional treatment options or monitoring required. -     Ambulatory referral to Dermatology   I am having Harriet Masson. Polinski maintain her calcium carbonate, fexofenadine, Cholecalciferol, ALPRAZolam, hyoscyamine, halobetasol, omeprazole, rosuvastatin, and spironolactone.  No orders of the defined types were placed in this encounter.    Follow-up: Return if symptoms worsen or fail to improve.  Katie Calico, MD

## 2020-02-10 ENCOUNTER — Encounter: Payer: Self-pay | Admitting: Dermatology

## 2020-02-10 ENCOUNTER — Other Ambulatory Visit: Payer: Self-pay

## 2020-02-10 ENCOUNTER — Ambulatory Visit (INDEPENDENT_AMBULATORY_CARE_PROVIDER_SITE_OTHER): Payer: Medicare Other | Admitting: Dermatology

## 2020-02-10 DIAGNOSIS — L570 Actinic keratosis: Secondary | ICD-10-CM | POA: Diagnosis not present

## 2020-02-10 DIAGNOSIS — D225 Melanocytic nevi of trunk: Secondary | ICD-10-CM

## 2020-02-10 DIAGNOSIS — D229 Melanocytic nevi, unspecified: Secondary | ICD-10-CM

## 2020-02-10 DIAGNOSIS — Z1231 Encounter for screening mammogram for malignant neoplasm of breast: Secondary | ICD-10-CM | POA: Diagnosis not present

## 2020-02-10 DIAGNOSIS — Z85828 Personal history of other malignant neoplasm of skin: Secondary | ICD-10-CM | POA: Diagnosis not present

## 2020-02-10 LAB — HM MAMMOGRAPHY

## 2020-02-17 ENCOUNTER — Other Ambulatory Visit: Payer: Self-pay | Admitting: Internal Medicine

## 2020-02-24 ENCOUNTER — Encounter: Payer: Self-pay | Admitting: Internal Medicine

## 2020-03-14 DIAGNOSIS — H9193 Unspecified hearing loss, bilateral: Secondary | ICD-10-CM | POA: Diagnosis not present

## 2020-03-14 DIAGNOSIS — Q310 Web of larynx: Secondary | ICD-10-CM | POA: Diagnosis not present

## 2020-03-14 DIAGNOSIS — R490 Dysphonia: Secondary | ICD-10-CM | POA: Diagnosis not present

## 2020-03-14 DIAGNOSIS — J383 Other diseases of vocal cords: Secondary | ICD-10-CM | POA: Diagnosis not present

## 2020-03-14 DIAGNOSIS — E854 Organ-limited amyloidosis: Secondary | ICD-10-CM | POA: Diagnosis not present

## 2020-03-14 DIAGNOSIS — J384 Edema of larynx: Secondary | ICD-10-CM | POA: Diagnosis not present

## 2020-03-15 ENCOUNTER — Other Ambulatory Visit: Payer: Self-pay | Admitting: Internal Medicine

## 2020-03-15 DIAGNOSIS — E559 Vitamin D deficiency, unspecified: Secondary | ICD-10-CM

## 2020-03-15 DIAGNOSIS — E785 Hyperlipidemia, unspecified: Secondary | ICD-10-CM

## 2020-03-26 ENCOUNTER — Encounter: Payer: Self-pay | Admitting: Dermatology

## 2020-03-26 NOTE — Progress Notes (Signed)
   Follow-Up Visit   Subjective  Katie Stout is a 74 y.o. female who presents for the following: Follow-up (Pt stated--left arm was sore to touch but much better now and check skin.).  Crust left arm Location:  Duration:  Quality: Improving Associated Signs/Symptoms: Modifying Factors:  Severity:  Timing: Context: History of multiple nonmelanoma skin cancers Objective  Well appearing patient in no apparent distress; mood and affect are within normal limits.  All sun exposed areas plus back examined.   Assessment & Plan    Nevus Mid Back  Annual skin examination  AK (actinic keratosis) Left Forearm - Posterior  Return if lesion worsens.      I, Lavonna Monarch, MD, have reviewed all documentation for this visit.  The documentation on 03/26/20 for the exam, diagnosis, procedures, and orders are all accurate and complete.

## 2020-03-30 DIAGNOSIS — M25561 Pain in right knee: Secondary | ICD-10-CM | POA: Diagnosis not present

## 2020-03-30 DIAGNOSIS — M170 Bilateral primary osteoarthritis of knee: Secondary | ICD-10-CM | POA: Diagnosis not present

## 2020-03-30 DIAGNOSIS — M25562 Pain in left knee: Secondary | ICD-10-CM | POA: Diagnosis not present

## 2020-04-03 ENCOUNTER — Telehealth: Payer: Self-pay | Admitting: Internal Medicine

## 2020-04-03 NOTE — Telephone Encounter (Signed)
Patient states she had a cortisone shots on Thursday and Saturday she started to feel flushed and running a fever of 99.4. As of today 9/27 she is still having a temp. She states its a fever because she never runs that high. Patient has an appointment tomorrow 9/28. She wants to know if she can still come into appointment.

## 2020-04-04 ENCOUNTER — Encounter: Payer: Self-pay | Admitting: Internal Medicine

## 2020-04-04 ENCOUNTER — Ambulatory Visit (INDEPENDENT_AMBULATORY_CARE_PROVIDER_SITE_OTHER): Payer: Medicare Other | Admitting: Internal Medicine

## 2020-04-04 ENCOUNTER — Other Ambulatory Visit: Payer: Self-pay

## 2020-04-04 VITALS — BP 148/86 | HR 90 | Temp 98.6°F | Resp 16 | Ht 64.0 in | Wt 194.0 lb

## 2020-04-04 DIAGNOSIS — D751 Secondary polycythemia: Secondary | ICD-10-CM | POA: Diagnosis not present

## 2020-04-04 DIAGNOSIS — D508 Other iron deficiency anemias: Secondary | ICD-10-CM | POA: Diagnosis not present

## 2020-04-04 DIAGNOSIS — I10 Essential (primary) hypertension: Secondary | ICD-10-CM

## 2020-04-04 DIAGNOSIS — Z23 Encounter for immunization: Secondary | ICD-10-CM

## 2020-04-04 DIAGNOSIS — Z0000 Encounter for general adult medical examination without abnormal findings: Secondary | ICD-10-CM

## 2020-04-04 DIAGNOSIS — E785 Hyperlipidemia, unspecified: Secondary | ICD-10-CM | POA: Diagnosis not present

## 2020-04-04 DIAGNOSIS — E559 Vitamin D deficiency, unspecified: Secondary | ICD-10-CM | POA: Diagnosis not present

## 2020-04-04 NOTE — Progress Notes (Signed)
Subjective:  Patient ID: Katie Stout, female    DOB: Feb 28, 1946  Age: 74 y.o. MRN: [id number]  CC: Hyperlipidemia, Hypertension, and Anemia  This visit occurred during the SARS-CoV-2 public health emergency.  Safety protocols were in place, including screening questions prior to the visit, additional usage of staff PPE, and extensive cleaning of exam room while observing appropriate contact time as indicated for disinfecting solutions.    HPI Katie Stout Hence presents for f/up - She tells me her blood pressure has been well controlled.  She denies headache, blurred vision, chest pain, shortness of breath, edema, DOE, or palpitations..  Outpatient Medications Prior to Visit  Medication Sig Dispense Refill  . ALPRAZolam (XANAX) 0.25 MG tablet TAKE 1 TABLET (0.25 MG TOTAL) BY MOUTH AT BEDTIME AS NEEDED FOR SLEEP. 30 tablet 5  . calcium carbonate (TUMS CALCIUM FOR LIFE BONE) 750 MG chewable tablet Chew 2-4 tablets by mouth daily.      . Cholecalciferol 2000 units TABS Take 1 tablet (2,000 Units total) by mouth daily. 90 tablet 1  . fexofenadine (ALLEGRA) 30 MG tablet Take 30 mg by mouth 2 (two) times daily.    . halobetasol (ULTRAVATE) 0.05 % cream     . hyoscyamine (LEVSIN SL) 0.125 MG SL tablet PLACE 1 TABLET (0.125 MG TOTAL) UNDER THE TONGUE EVERY 4 (FOUR) HOURS AS NEEDED FOR CRAMPING. 30 tablet 1  . omeprazole (PRILOSEC) 20 MG capsule TAKE 1 CAPSULE BY MOUTH EVERY DAY 90 capsule 0  . rosuvastatin (CRESTOR) 10 MG tablet TAKE 1 TABLET BY MOUTH EVERY DAY 90 tablet 1  . spironolactone (ALDACTONE) 25 MG tablet TAKE 1 TABLET BY MOUTH EVERY DAY 90 tablet 1   No facility-administered medications prior to visit.    ROS Review of Systems  Constitutional: Negative.  Negative for appetite change, diaphoresis, fatigue and unexpected weight change.  HENT: Negative.   Eyes: Negative for visual disturbance.  Respiratory: Negative for cough, chest tightness, shortness of breath and wheezing.    Cardiovascular: Negative for chest pain, palpitations and leg swelling.  Gastrointestinal: Negative for abdominal pain, constipation, diarrhea, nausea and vomiting.  Endocrine: Negative.   Genitourinary: Negative.  Negative for difficulty urinating.  Musculoskeletal: Negative.  Negative for arthralgias and myalgias.  Skin: Negative.  Negative for color change and pallor.  Neurological: Negative.  Negative for dizziness, weakness, light-headedness and headaches.  Hematological: Negative for adenopathy. Does not bruise/bleed easily.  Psychiatric/Behavioral: Negative.     Objective:  BP (!) 148/86   Pulse 90   Temp 98.6 F (37 C) (Oral)   Resp 16   Ht 5\' 4"  (1.626 m)   Wt 194 lb (88 kg)   SpO2 97%   BMI 33.30 kg/m   BP Readings from Last 3 Encounters:  04/04/20 (!) 148/86  12/22/19 (!) 162/84  12/15/19 (!) 166/86    Wt Readings from Last 3 Encounters:  04/04/20 194 lb (88 kg)  12/22/19 193 lb 6 oz (87.7 kg)  12/15/19 195 lb 9.6 oz (88.7 kg)    Physical Exam Vitals reviewed.  Constitutional:      Appearance: Normal appearance.  HENT:     Nose: Nose normal.     Mouth/Throat:     Mouth: Mucous membranes are moist.  Eyes:     General: No scleral icterus.    Conjunctiva/sclera: Conjunctivae normal.  Cardiovascular:     Rate and Rhythm: Normal rate and regular rhythm.     Heart sounds: No murmur heard.   Pulmonary:  Effort: Pulmonary effort is normal.     Breath sounds: No wheezing, rhonchi or rales.  Abdominal:     General: Abdomen is flat.     Palpations: There is no mass.     Tenderness: There is no abdominal tenderness. There is no guarding.  Musculoskeletal:        General: Normal range of motion.     Cervical back: Neck supple.     Right lower leg: No edema.     Left lower leg: No edema.  Lymphadenopathy:     Cervical: No cervical adenopathy.  Skin:    General: Skin is warm and dry.     Coloration: Skin is not pale.  Neurological:     General: No  focal deficit present.     Mental Status: She is alert.     Lab Results  Component Value Date   WBC 8.1 04/04/2020   HGB 16.3 (H) 04/04/2020   HCT 48.5 (H) 04/04/2020   PLT 251 04/04/2020   GLUCOSE 86 04/04/2020   CHOL 188 04/04/2020   TRIG 143 04/04/2020   HDL 74 04/04/2020   LDLDIRECT 132.2 06/10/2012   LDLCALC 90 04/04/2020   ALT 33 03/24/2018   AST 23 03/24/2018   NA 138 04/04/2020   K 4.4 04/04/2020   CL 101 04/04/2020   CREATININE 0.72 04/04/2020   BUN 18 04/04/2020   CO2 28 04/04/2020   TSH 4.14 04/04/2020   HGBA1C 5.9 12/26/2006    CT SOFT TISSUE NECK W CONTRAST  Result Date: 09/17/2017 CLINICAL DATA:  Neurologic dysphonia EXAM: CT NECK WITH CONTRAST TECHNIQUE: Multidetector CT imaging of the neck was performed using the standard protocol following the bolus administration of intravenous contrast. Creatinine was obtained on site at [facility] at [address] Results: Creatinine 0.7 mg/dL. CONTRAST:  Reference EMR COMPARISON:  03/10/2015 FINDINGS: Pharynx and larynx: There is a gas filled structure in the right paraglottic fat consistent with laryngocele, neck not resolved. At time of imaging the sac measured 12 mm. There is a smooth submucosal soft tissue density nodule symmetrically on the left that is stable. There is mild supraglottic airway distortion, greater on the right. Salivary glands: No inflammation, mass, or stone. Thyroid: 5 mm left thyroid nodule, incidental at this small size. Lymph nodes: None enlarged or abnormal density Vascular: Atherosclerotic calcification of the aorta and cervical carotids. Limited intracranial: Negative Visualized orbits: Bilateral cataract resection. Mastoids and visualized paranasal sinuses: Minor mucosal thickening on the floors of the maxillary sinuses. Secretions present in the right maxillary sinus. Skeleton: No acute or aggressive finding cervical facet arthropathy with multilevel ankylosis. Thoracic scoliosis.  Upper chest: Biapical pleural scarring. IMPRESSION: 1. 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 2. Symmetric soft tissue density mass in the left paraglottic fat that favors opacified internal laryngocele, stable from 2016. Electronically Signed   By: Monte Fantasia M.D.   On: 09/17/2017 14:54    Assessment & Plan:   Louiza was seen today for hyperlipidemia, hypertension and anemia.  Diagnoses and all orders for this visit:  Essential hypertension- Her blood pressure is adequately well controlled.  Electrolytes and renal function are normal. -     BASIC METABOLIC PANEL WITH GFR; Future -     TSH; Future -     Magnesium; Future -     Magnesium -     TSH -     BASIC METABOLIC PANEL WITH GFR  Hyperlipidemia with target LDL less than 130- She has achieved her LDL goal and is doing well on the statin. -     Lipid panel; Future -     TSH; Future -     TSH -     Lipid panel  Other iron deficiency anemia- Her H&H are normal now. -     CBC with Differential/Platelet; Future -     Ferritin; Future -     Iron; Future -     Iron -     Ferritin -     CBC with Differential/Platelet  Routine general medical examination at a health care facility  Vitamin D deficiency- Her vitamin D level is normal now. -     VITAMIN D 25 Hydroxy (Vit-D Deficiency, Fractures); Future -     VITAMIN D 25 Hydroxy (Vit-D Deficiency, Fractures)  Flu vaccine need -     Flu Vaccine QUAD High Dose(Fluad)  Erythrocytosis- She has a recurrence of mild erythrocytosis.  She does not appear dehydrated and reports no history of hypoxemia.  I have asked her to return to have this rechecked and to let me know if she develops any suspicious symptoms.  Other orders -     Pneumococcal polysaccharide vaccine 23-valent greater than or equal to 2yo subcutaneous/IM   I am having Harriet Masson. Mette maintain her calcium carbonate, fexofenadine, Cholecalciferol,  ALPRAZolam, hyoscyamine, halobetasol, spironolactone, omeprazole, and rosuvastatin.  No orders of the defined types were placed in this encounter.    Follow-up: Return in about 6 months (around 10/02/2020).  [name], MD

## 2020-04-04 NOTE — Patient Instructions (Signed)

## 2020-04-05 ENCOUNTER — Encounter: Payer: Self-pay | Admitting: Internal Medicine

## 2020-04-05 DIAGNOSIS — D751 Secondary polycythemia: Secondary | ICD-10-CM | POA: Insufficient documentation

## 2020-04-05 DIAGNOSIS — Z23 Encounter for immunization: Secondary | ICD-10-CM | POA: Insufficient documentation

## 2020-04-05 LAB — CBC WITH DIFFERENTIAL/PLATELET
Absolute Monocytes: 705 cells/uL (ref 200–950)
Basophils Absolute: 16 cells/uL (ref 0–200)
Basophils Relative: 0.2 %
Eosinophils Absolute: 57 cells/uL (ref 15–500)
Eosinophils Relative: 0.7 %
HCT: 48.5 % — ABNORMAL HIGH (ref 35.0–45.0)
Hemoglobin: 16.3 g/dL — ABNORMAL HIGH (ref 11.7–15.5)
Lymphs Abs: 915 cells/uL (ref 850–3900)
MCH: 30.6 pg (ref 27.0–33.0)
MCHC: 33.6 g/dL (ref 32.0–36.0)
MCV: 91.2 fL (ref 80.0–100.0)
MPV: 10 fL (ref 7.5–12.5)
Monocytes Relative: 8.7 %
Neutro Abs: 6407 cells/uL (ref 1500–7800)
Neutrophils Relative %: 79.1 %
Platelets: 251 10*3/uL (ref 140–400)
RBC: 5.32 10*6/uL — ABNORMAL HIGH (ref 3.80–5.10)
RDW: 13 % (ref 11.0–15.0)
Total Lymphocyte: 11.3 %
WBC: 8.1 10*3/uL (ref 3.8–10.8)

## 2020-04-05 LAB — BASIC METABOLIC PANEL WITH GFR
BUN: 18 mg/dL (ref 7–25)
CO2: 28 mmol/L (ref 20–32)
Calcium: 9.8 mg/dL (ref 8.6–10.4)
Chloride: 101 mmol/L (ref 98–110)
Creat: 0.72 mg/dL (ref 0.60–0.93)
GFR, Est African American: 96 mL/min/{1.73_m2} (ref >=60)
GFR, Est Non African American: 82 mL/min/{1.73_m2} (ref >=60)
Glucose, Bld: 86 mg/dL (ref 65–99)
Potassium: 4.4 mmol/L (ref 3.5–5.3)
Sodium: 138 mmol/L (ref 135–146)

## 2020-04-05 LAB — LIPID PANEL
Cholesterol: 188 mg/dL (ref <200)
HDL: 74 mg/dL (ref >=50)
LDL Cholesterol (Calc): 90 mg/dL (calc)
Non-HDL Cholesterol (Calc): 114 mg/dL (calc) (ref <130)
Total CHOL/HDL Ratio: 2.5 (calc) (ref <5.0)
Triglycerides: 143 mg/dL (ref <150)

## 2020-04-05 LAB — TSH: TSH: 4.14 mIU/L (ref 0.40–4.50)

## 2020-04-05 LAB — MAGNESIUM: Magnesium: 2.3 mg/dL (ref 1.5–2.5)

## 2020-04-05 LAB — VITAMIN D 25 HYDROXY (VIT D DEFICIENCY, FRACTURES): Vit D, 25-Hydroxy: 39 ng/mL (ref 30–100)

## 2020-04-05 LAB — FERRITIN: Ferritin: 28 ng/mL (ref 16–288)

## 2020-04-05 LAB — IRON: Iron: 150 ug/dL (ref 45–160)

## 2020-04-12 DIAGNOSIS — H903 Sensorineural hearing loss, bilateral: Secondary | ICD-10-CM | POA: Diagnosis not present

## 2020-04-26 DIAGNOSIS — Z9842 Cataract extraction status, left eye: Secondary | ICD-10-CM | POA: Diagnosis not present

## 2020-04-26 DIAGNOSIS — Z9841 Cataract extraction status, right eye: Secondary | ICD-10-CM | POA: Diagnosis not present

## 2020-04-26 DIAGNOSIS — H353131 Nonexudative age-related macular degeneration, bilateral, early dry stage: Secondary | ICD-10-CM | POA: Diagnosis not present

## 2020-04-26 DIAGNOSIS — Z961 Presence of intraocular lens: Secondary | ICD-10-CM | POA: Diagnosis not present

## 2020-04-26 DIAGNOSIS — H04123 Dry eye syndrome of bilateral lacrimal glands: Secondary | ICD-10-CM | POA: Diagnosis not present

## 2020-04-26 DIAGNOSIS — H43813 Vitreous degeneration, bilateral: Secondary | ICD-10-CM | POA: Diagnosis not present

## 2020-05-03 ENCOUNTER — Telehealth (INDEPENDENT_AMBULATORY_CARE_PROVIDER_SITE_OTHER): Payer: Medicare Other | Admitting: Family

## 2020-05-03 DIAGNOSIS — J069 Acute upper respiratory infection, unspecified: Secondary | ICD-10-CM | POA: Diagnosis not present

## 2020-05-03 MED ORDER — AZITHROMYCIN 250 MG PO TABS: ORAL_TABLET | ORAL | 0 refills | Status: DC

## 2020-05-03 NOTE — Progress Notes (Signed)
Katie Stout is a 74 y.o. female with the following history as recorded in EpicCare:  Patient Active Problem List   Diagnosis Date Noted  . Flu vaccine need 04/05/2020  . Erythrocytosis 04/05/2020  . 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 URI (upper respiratory infection) 10/19/2019  . Acute hand eczema 03/24/2018  . Vitamin D deficiency 09/29/2017  . Subacromial impingement of left shoulder 03/04/2017  . Arthralgia of left acromioclavicular joint 03/04/2017  . Systolic murmur 24/03/7352  . Degenerative disc disease, cervical 01/10/2016  . Insomnia 10/27/2014  . Iron deficiency anemia 06/27/2011  . 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 Spastic torticollis 05/24/2010  . Allergic rhinitis 12/29/2009  . Osteoporosis 03/28/2008  . Hyperlipidemia with target LDL less than 130 01/24/2007  . Depression 01/24/2007  . Essential hypertension 01/24/2007  . GERD 01/24/2007    Current Outpatient Medications  Medication Sig Dispense Refill  . ALPRAZolam (XANAX) 0.25 MG tablet TAKE 1 TABLET (0.25 MG TOTAL) BY MOUTH AT BEDTIME AS NEEDED FOR SLEEP. 30 tablet 5  . azithromycin (ZITHROMAX) 250 MG tablet 2 tabs po qd x 1 day; 1 tablet per day x 4 days; 6 tablet 0  . calcium carbonate (TUMS CALCIUM FOR LIFE BONE) 750 MG chewable tablet Chew 2-4 tablets by mouth daily.      . Cholecalciferol 2000 units TABS Take 1 tablet (2,000 Units total) by mouth daily. 90 tablet 1  . fexofenadine (ALLEGRA) 30 MG tablet Take 30 mg by mouth 2 (two) times daily.    . halobetasol (ULTRAVATE) 0.05 % cream     . hyoscyamine (LEVSIN SL) 0.125 MG SL tablet PLACE 1 TABLET (0.125 MG TOTAL) UNDER THE TONGUE EVERY 4 (FOUR) HOURS AS NEEDED FOR CRAMPING. 30 tablet 1  . omeprazole (PRILOSEC) 20 MG capsule TAKE 1 CAPSULE BY MOUTH EVERY DAY 90 capsule 0  . rosuvastatin (CRESTOR) 10 MG tablet TAKE 1 TABLET BY MOUTH EVERY DAY 90 tablet 1  . spironolactone (ALDACTONE) 25 MG tablet TAKE 1 TABLET  BY MOUTH EVERY DAY 90 tablet 1   No current facility-administered medications for this visit.    Allergies: Penicillins, Penicillin g, Adhesive [tape], Bactrim [sulfamethoxazole-trimethoprim], and Codeine  Past Medical History:  Diagnosis Date  . Anxiety   . Complication of anesthesia    states had flashing lights x 6 wks. post-op in 1992  . Dental crowns present   . Difficulty swallowing solids    states difficulty swallowing dry foods  . Diverticulosis of colon (without mention of hemorrhage)   . Eczema   . GERD (gastroesophageal reflux disease)   . H. pylori infection   . Hiatal hernia    states "small"  . History of kidney stones   . Hypertension    under control, has been on med. x 7 yrs.  . 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 Other and unspecified hyperlipidemia   . Palpitations    "all my life" - states no problems; states had stress test 2006; no cardiologist  . Post-nasal drip 04/10/2012  . Rash    breasts  . Spasmodic torticollis    has difficulty turning head to left side; receives Botox injections  . Squamous cell carcinoma of skin 12/15/2019   [facility]-Left forearm (done at [facility] office)    Past Surgical History:  Procedure Laterality Date  . ABDOMINAL HYSTERECTOMY  1993   complete  . CATARACT EXTRACTION, BILATERAL  2007  . CESAREAN SECTION    .  COLONOSCOPY  02/10/2013  . ESOPHAGOGASTRODUODENOSCOPY  02/10/2013  . ESOPHAGOSCOPY  04/14/2012   Procedure: ESOPHAGOSCOPY;  Surgeon: Rozetta Nunnery, MD;  Location: [facility];  Service: ENT;  Laterality: Right;  . LARYNX SURGERY  11/2011   vocal cord biopsy  . NASAL HEMORRHAGE CONTROL  04/14/2012   Procedure: EPISTAXIS CONTROL;  Surgeon: Rozetta Nunnery, MD;  Location: [facility];  Service: ENT;  Laterality: Right;  Cauterization of Right Septal Vessel  . OOPHORECTOMY  1992   x 1  . ORIF ULNAR FRACTURE  01/16/2000   left: also radial head dislocation  . TRANSPHENOIDAL  / TRANSNASAL HYPOPHYSECTOMY / RESECTION PITUITARY TUMOR  1983    Family History  Problem Relation Age of Onset  . Coronary artery disease Mother   . Heart attack Mother   . Breast cancer Mother   . Colon cancer Mother   . Hypertension Father   . Thrombocytopenia Father   . Other Father        ITP  . Prostate cancer Brother   . AAA (abdominal aortic aneurysm) Brother     Social History   Tobacco Use  . Smoking status: Never Smoker  . Smokeless tobacco: Never Used  Substance Use Topics  . Alcohol use: No    Alcohol/week: 0.0 standard drinks    Subjective:    I connected with Katie Stout on 05/03/20 at 11:40 AM EDT by a video enabled telemedicine application and verified that I am speaking with the correct person using two identifiers.   I discussed the limitations of evaluation and management by telemedicine and the availability of in person appointments. The patient expressed understanding and agreed to proceed. Provider in office/ patient is at home; provider and patient are only 2 people on video call.   Worsening cough/ congestion x 3 days; + sinus pain/ pressure; fully vaccinated against COVID- no shortness of breath; grandchildren visited recently and had similar symptoms; asking for Z-pak;     Objective:  There were no vitals filed for this visit.  General: Well developed, well nourished, in no acute distress  Head: Normocephalic and atraumatic  Lungs: Respirations unlabored;  Neurologic: Alert and oriented; speech intact; face symmetrical;   Assessment:  1. Upper respiratory tract infection, unspecified type     Plan:  Rx for Z-pak; low suspicion for COVID at this time; follow-up with her PCP in person if symptoms persist;  No follow-ups on file.  No orders of the defined types were placed in this encounter.   Requested Prescriptions   Signed Prescriptions Disp Refills  . azithromycin (ZITHROMAX) 250 MG tablet 6 tablet 0    Sig: 2 tabs po qd x 1 day; 1  tablet per day x 4 days;

## 2020-05-15 ENCOUNTER — Telehealth: Payer: Self-pay | Admitting: Neurology

## 2020-05-15 DIAGNOSIS — K219 Gastro-esophageal reflux disease without esophagitis: Secondary | ICD-10-CM | POA: Diagnosis not present

## 2020-05-15 DIAGNOSIS — K224 Dyskinesia of esophagus: Secondary | ICD-10-CM | POA: Diagnosis not present

## 2020-05-15 NOTE — Telephone Encounter (Signed)
Sounds good.  Dr. Hall Busing is great!

## 2020-05-15 NOTE — Telephone Encounter (Signed)
Left patient a message on her voicemail informing her that Dr Tat has been made aware that she has changed providers.

## 2020-05-15 NOTE — Telephone Encounter (Signed)
Patient called to let Dr. Carles Collet know she has relocated to [place] to be closer to her daughter. She has found a new neurologist, Dr. Kyra Searles at [facility]. FYI only.

## 2020-05-16 ENCOUNTER — Other Ambulatory Visit: Payer: Self-pay | Admitting: Internal Medicine

## 2020-05-16 DIAGNOSIS — E876 Hypokalemia: Secondary | ICD-10-CM

## 2020-05-16 DIAGNOSIS — I10 Essential (primary) hypertension: Secondary | ICD-10-CM

## 2020-05-23 DIAGNOSIS — G243 Spasmodic torticollis: Secondary | ICD-10-CM | POA: Diagnosis not present

## 2020-06-06 DIAGNOSIS — H6122 Impacted cerumen, left ear: Secondary | ICD-10-CM | POA: Diagnosis not present

## 2020-06-06 DIAGNOSIS — Z885 Allergy status to narcotic agent status: Secondary | ICD-10-CM | POA: Diagnosis not present

## 2020-06-06 DIAGNOSIS — H903 Sensorineural hearing loss, bilateral: Secondary | ICD-10-CM | POA: Diagnosis not present

## 2020-06-06 DIAGNOSIS — S0991XA Unspecified injury of ear, initial encounter: Secondary | ICD-10-CM | POA: Diagnosis not present

## 2020-06-06 DIAGNOSIS — Z880 Allergy status to penicillin: Secondary | ICD-10-CM | POA: Diagnosis not present

## 2020-06-06 DIAGNOSIS — H9313 Tinnitus, bilateral: Secondary | ICD-10-CM | POA: Diagnosis not present

## 2020-06-06 DIAGNOSIS — Z881 Allergy status to other antibiotic agents status: Secondary | ICD-10-CM | POA: Diagnosis not present

## 2020-06-06 DIAGNOSIS — Z91040 Latex allergy status: Secondary | ICD-10-CM | POA: Diagnosis not present

## 2020-06-20 DIAGNOSIS — M75101 Unspecified rotator cuff tear or rupture of right shoulder, not specified as traumatic: Secondary | ICD-10-CM | POA: Diagnosis not present

## 2020-06-20 DIAGNOSIS — E854 Organ-limited amyloidosis: Secondary | ICD-10-CM | POA: Diagnosis not present

## 2020-06-20 DIAGNOSIS — K449 Diaphragmatic hernia without obstruction or gangrene: Secondary | ICD-10-CM | POA: Diagnosis not present

## 2020-06-20 DIAGNOSIS — K224 Dyskinesia of esophagus: Secondary | ICD-10-CM | POA: Diagnosis not present

## 2020-06-20 DIAGNOSIS — D352 Benign neoplasm of pituitary gland: Secondary | ICD-10-CM | POA: Diagnosis not present

## 2020-06-20 DIAGNOSIS — I10 Essential (primary) hypertension: Secondary | ICD-10-CM | POA: Diagnosis not present

## 2020-06-20 DIAGNOSIS — M810 Age-related osteoporosis without current pathological fracture: Secondary | ICD-10-CM | POA: Diagnosis not present

## 2020-06-20 DIAGNOSIS — G243 Spasmodic torticollis: Secondary | ICD-10-CM | POA: Diagnosis not present

## 2020-06-20 DIAGNOSIS — M170 Bilateral primary osteoarthritis of knee: Secondary | ICD-10-CM | POA: Diagnosis not present

## 2020-06-20 DIAGNOSIS — F411 Generalized anxiety disorder: Secondary | ICD-10-CM | POA: Diagnosis not present

## 2020-06-20 DIAGNOSIS — C4492 Squamous cell carcinoma of skin, unspecified: Secondary | ICD-10-CM | POA: Diagnosis not present

## 2020-06-20 DIAGNOSIS — F5104 Psychophysiologic insomnia: Secondary | ICD-10-CM | POA: Diagnosis not present

## 2020-07-16 DIAGNOSIS — R0602 Shortness of breath: Secondary | ICD-10-CM | POA: Diagnosis not present

## 2020-07-16 DIAGNOSIS — F32A Depression, unspecified: Secondary | ICD-10-CM | POA: Diagnosis present

## 2020-07-16 DIAGNOSIS — G4489 Other headache syndrome: Secondary | ICD-10-CM | POA: Diagnosis not present

## 2020-07-16 DIAGNOSIS — R059 Cough, unspecified: Secondary | ICD-10-CM | POA: Diagnosis not present

## 2020-07-16 DIAGNOSIS — R5383 Other fatigue: Secondary | ICD-10-CM | POA: Diagnosis not present

## 2020-07-16 DIAGNOSIS — R6520 Severe sepsis without septic shock: Secondary | ICD-10-CM | POA: Diagnosis not present

## 2020-07-16 DIAGNOSIS — B9781 Human metapneumovirus as the cause of diseases classified elsewhere: Secondary | ICD-10-CM | POA: Diagnosis present

## 2020-07-16 DIAGNOSIS — E876 Hypokalemia: Secondary | ICD-10-CM | POA: Diagnosis present

## 2020-07-16 DIAGNOSIS — J189 Pneumonia, unspecified organism: Secondary | ICD-10-CM | POA: Diagnosis not present

## 2020-07-16 DIAGNOSIS — R069 Unspecified abnormalities of breathing: Secondary | ICD-10-CM | POA: Diagnosis not present

## 2020-07-16 DIAGNOSIS — A419 Sepsis, unspecified organism: Secondary | ICD-10-CM | POA: Diagnosis not present

## 2020-07-16 DIAGNOSIS — K219 Gastro-esophageal reflux disease without esophagitis: Secondary | ICD-10-CM | POA: Diagnosis present

## 2020-07-16 DIAGNOSIS — Z20822 Contact with and (suspected) exposure to covid-19: Secondary | ICD-10-CM | POA: Diagnosis not present

## 2020-07-16 DIAGNOSIS — A4189 Other specified sepsis: Secondary | ICD-10-CM | POA: Diagnosis not present

## 2020-07-16 DIAGNOSIS — F329 Major depressive disorder, single episode, unspecified: Secondary | ICD-10-CM | POA: Diagnosis not present

## 2020-07-16 DIAGNOSIS — R509 Fever, unspecified: Secondary | ICD-10-CM | POA: Diagnosis not present

## 2020-07-16 DIAGNOSIS — I10 Essential (primary) hypertension: Secondary | ICD-10-CM | POA: Diagnosis present

## 2020-07-16 DIAGNOSIS — E782 Mixed hyperlipidemia: Secondary | ICD-10-CM | POA: Diagnosis present

## 2020-07-16 DIAGNOSIS — Z20828 Contact with and (suspected) exposure to other viral communicable diseases: Secondary | ICD-10-CM | POA: Diagnosis not present

## 2020-07-16 DIAGNOSIS — J9601 Acute respiratory failure with hypoxia: Secondary | ICD-10-CM | POA: Diagnosis not present

## 2020-07-16 DIAGNOSIS — R17 Unspecified jaundice: Secondary | ICD-10-CM | POA: Diagnosis not present

## 2020-07-16 DIAGNOSIS — J159 Unspecified bacterial pneumonia: Secondary | ICD-10-CM | POA: Diagnosis not present

## 2020-07-17 DIAGNOSIS — R6520 Severe sepsis without septic shock: Secondary | ICD-10-CM | POA: Diagnosis present

## 2020-07-17 DIAGNOSIS — K219 Gastro-esophageal reflux disease without esophagitis: Secondary | ICD-10-CM | POA: Diagnosis present

## 2020-07-17 DIAGNOSIS — A419 Sepsis, unspecified organism: Secondary | ICD-10-CM | POA: Diagnosis not present

## 2020-07-17 DIAGNOSIS — E782 Mixed hyperlipidemia: Secondary | ICD-10-CM | POA: Diagnosis present

## 2020-07-17 DIAGNOSIS — F329 Major depressive disorder, single episode, unspecified: Secondary | ICD-10-CM | POA: Diagnosis not present

## 2020-07-17 DIAGNOSIS — J9601 Acute respiratory failure with hypoxia: Secondary | ICD-10-CM | POA: Diagnosis not present

## 2020-07-17 DIAGNOSIS — B9781 Human metapneumovirus as the cause of diseases classified elsewhere: Secondary | ICD-10-CM | POA: Diagnosis not present

## 2020-07-17 DIAGNOSIS — R17 Unspecified jaundice: Secondary | ICD-10-CM | POA: Diagnosis present

## 2020-07-17 DIAGNOSIS — J159 Unspecified bacterial pneumonia: Secondary | ICD-10-CM | POA: Diagnosis present

## 2020-07-17 DIAGNOSIS — F32A Depression, unspecified: Secondary | ICD-10-CM | POA: Diagnosis present

## 2020-07-17 DIAGNOSIS — I10 Essential (primary) hypertension: Secondary | ICD-10-CM | POA: Diagnosis present

## 2020-07-17 DIAGNOSIS — A4189 Other specified sepsis: Secondary | ICD-10-CM | POA: Diagnosis present

## 2020-07-17 DIAGNOSIS — Z20822 Contact with and (suspected) exposure to covid-19: Secondary | ICD-10-CM | POA: Diagnosis present

## 2020-07-17 DIAGNOSIS — E876 Hypokalemia: Secondary | ICD-10-CM | POA: Diagnosis present

## 2020-07-21 DIAGNOSIS — R918 Other nonspecific abnormal finding of lung field: Secondary | ICD-10-CM | POA: Diagnosis not present

## 2020-07-21 DIAGNOSIS — Z85828 Personal history of other malignant neoplasm of skin: Secondary | ICD-10-CM | POA: Diagnosis not present

## 2020-07-21 DIAGNOSIS — J310 Chronic rhinitis: Secondary | ICD-10-CM | POA: Diagnosis not present

## 2020-07-21 DIAGNOSIS — J189 Pneumonia, unspecified organism: Secondary | ICD-10-CM | POA: Diagnosis not present

## 2020-07-21 DIAGNOSIS — E785 Hyperlipidemia, unspecified: Secondary | ICD-10-CM | POA: Diagnosis not present

## 2020-07-21 DIAGNOSIS — K449 Diaphragmatic hernia without obstruction or gangrene: Secondary | ICD-10-CM | POA: Diagnosis not present

## 2020-07-21 DIAGNOSIS — Z9181 History of falling: Secondary | ICD-10-CM | POA: Diagnosis not present

## 2020-07-21 DIAGNOSIS — M810 Age-related osteoporosis without current pathological fracture: Secondary | ICD-10-CM | POA: Diagnosis not present

## 2020-07-21 DIAGNOSIS — I10 Essential (primary) hypertension: Secondary | ICD-10-CM | POA: Diagnosis not present

## 2020-07-21 DIAGNOSIS — M170 Bilateral primary osteoarthritis of knee: Secondary | ICD-10-CM | POA: Diagnosis not present

## 2020-07-21 DIAGNOSIS — R490 Dysphonia: Secondary | ICD-10-CM | POA: Diagnosis not present

## 2020-07-21 DIAGNOSIS — E7800 Pure hypercholesterolemia, unspecified: Secondary | ICD-10-CM | POA: Diagnosis not present

## 2020-07-21 DIAGNOSIS — H905 Unspecified sensorineural hearing loss: Secondary | ICD-10-CM | POA: Diagnosis not present

## 2020-07-21 DIAGNOSIS — Z87442 Personal history of urinary calculi: Secondary | ICD-10-CM | POA: Diagnosis not present

## 2020-07-21 DIAGNOSIS — M19012 Primary osteoarthritis, left shoulder: Secondary | ICD-10-CM | POA: Diagnosis not present

## 2020-07-21 DIAGNOSIS — J209 Acute bronchitis, unspecified: Secondary | ICD-10-CM | POA: Diagnosis not present

## 2020-07-21 DIAGNOSIS — J383 Other diseases of vocal cords: Secondary | ICD-10-CM | POA: Diagnosis not present

## 2020-07-21 DIAGNOSIS — J188 Other pneumonia, unspecified organism: Secondary | ICD-10-CM | POA: Diagnosis not present

## 2020-07-21 DIAGNOSIS — H9313 Tinnitus, bilateral: Secondary | ICD-10-CM | POA: Diagnosis not present

## 2020-07-21 DIAGNOSIS — R059 Cough, unspecified: Secondary | ICD-10-CM | POA: Diagnosis not present

## 2020-07-21 DIAGNOSIS — K219 Gastro-esophageal reflux disease without esophagitis: Secondary | ICD-10-CM | POA: Diagnosis not present

## 2020-07-21 DIAGNOSIS — R1310 Dysphagia, unspecified: Secondary | ICD-10-CM | POA: Diagnosis not present

## 2020-07-21 DIAGNOSIS — E559 Vitamin D deficiency, unspecified: Secondary | ICD-10-CM | POA: Diagnosis not present

## 2020-07-21 DIAGNOSIS — J123 Human metapneumovirus pneumonia: Secondary | ICD-10-CM | POA: Diagnosis not present

## 2020-07-21 DIAGNOSIS — E876 Hypokalemia: Secondary | ICD-10-CM | POA: Diagnosis not present

## 2020-07-21 DIAGNOSIS — F411 Generalized anxiety disorder: Secondary | ICD-10-CM | POA: Diagnosis not present

## 2020-07-21 DIAGNOSIS — M19011 Primary osteoarthritis, right shoulder: Secondary | ICD-10-CM | POA: Diagnosis not present

## 2020-07-21 DIAGNOSIS — F32A Depression, unspecified: Secondary | ICD-10-CM | POA: Diagnosis not present

## 2020-07-26 DIAGNOSIS — M170 Bilateral primary osteoarthritis of knee: Secondary | ICD-10-CM | POA: Diagnosis not present

## 2020-07-26 DIAGNOSIS — J188 Other pneumonia, unspecified organism: Secondary | ICD-10-CM | POA: Diagnosis not present

## 2020-07-26 DIAGNOSIS — J123 Human metapneumovirus pneumonia: Secondary | ICD-10-CM | POA: Diagnosis not present

## 2020-07-26 DIAGNOSIS — K219 Gastro-esophageal reflux disease without esophagitis: Secondary | ICD-10-CM | POA: Diagnosis not present

## 2020-07-26 DIAGNOSIS — E876 Hypokalemia: Secondary | ICD-10-CM | POA: Diagnosis not present

## 2020-07-26 DIAGNOSIS — I10 Essential (primary) hypertension: Secondary | ICD-10-CM | POA: Diagnosis not present

## 2020-07-28 DIAGNOSIS — E876 Hypokalemia: Secondary | ICD-10-CM | POA: Diagnosis not present

## 2020-07-28 DIAGNOSIS — I10 Essential (primary) hypertension: Secondary | ICD-10-CM | POA: Diagnosis not present

## 2020-07-28 DIAGNOSIS — J123 Human metapneumovirus pneumonia: Secondary | ICD-10-CM | POA: Diagnosis not present

## 2020-07-28 DIAGNOSIS — K219 Gastro-esophageal reflux disease without esophagitis: Secondary | ICD-10-CM | POA: Diagnosis not present

## 2020-07-28 DIAGNOSIS — J188 Other pneumonia, unspecified organism: Secondary | ICD-10-CM | POA: Diagnosis not present

## 2020-07-28 DIAGNOSIS — M170 Bilateral primary osteoarthritis of knee: Secondary | ICD-10-CM | POA: Diagnosis not present

## 2020-08-01 DIAGNOSIS — J189 Pneumonia, unspecified organism: Secondary | ICD-10-CM | POA: Diagnosis not present

## 2020-08-01 DIAGNOSIS — J9811 Atelectasis: Secondary | ICD-10-CM | POA: Diagnosis not present

## 2020-08-01 DIAGNOSIS — I10 Essential (primary) hypertension: Secondary | ICD-10-CM | POA: Diagnosis not present

## 2020-08-01 DIAGNOSIS — L608 Other nail disorders: Secondary | ICD-10-CM | POA: Diagnosis not present

## 2020-08-02 DIAGNOSIS — E876 Hypokalemia: Secondary | ICD-10-CM | POA: Diagnosis not present

## 2020-08-02 DIAGNOSIS — K219 Gastro-esophageal reflux disease without esophagitis: Secondary | ICD-10-CM | POA: Diagnosis not present

## 2020-08-02 DIAGNOSIS — M170 Bilateral primary osteoarthritis of knee: Secondary | ICD-10-CM | POA: Diagnosis not present

## 2020-08-02 DIAGNOSIS — J188 Other pneumonia, unspecified organism: Secondary | ICD-10-CM | POA: Diagnosis not present

## 2020-08-02 DIAGNOSIS — J123 Human metapneumovirus pneumonia: Secondary | ICD-10-CM | POA: Diagnosis not present

## 2020-08-02 DIAGNOSIS — I10 Essential (primary) hypertension: Secondary | ICD-10-CM | POA: Diagnosis not present

## 2020-08-04 DIAGNOSIS — J188 Other pneumonia, unspecified organism: Secondary | ICD-10-CM | POA: Diagnosis not present

## 2020-08-04 DIAGNOSIS — E876 Hypokalemia: Secondary | ICD-10-CM | POA: Diagnosis not present

## 2020-08-04 DIAGNOSIS — I10 Essential (primary) hypertension: Secondary | ICD-10-CM | POA: Diagnosis not present

## 2020-08-04 DIAGNOSIS — M170 Bilateral primary osteoarthritis of knee: Secondary | ICD-10-CM | POA: Diagnosis not present

## 2020-08-04 DIAGNOSIS — K219 Gastro-esophageal reflux disease without esophagitis: Secondary | ICD-10-CM | POA: Diagnosis not present

## 2020-08-04 DIAGNOSIS — J123 Human metapneumovirus pneumonia: Secondary | ICD-10-CM | POA: Diagnosis not present

## 2020-08-08 DIAGNOSIS — I10 Essential (primary) hypertension: Secondary | ICD-10-CM | POA: Diagnosis not present

## 2020-08-08 DIAGNOSIS — J188 Other pneumonia, unspecified organism: Secondary | ICD-10-CM | POA: Diagnosis not present

## 2020-08-08 DIAGNOSIS — K219 Gastro-esophageal reflux disease without esophagitis: Secondary | ICD-10-CM | POA: Diagnosis not present

## 2020-08-08 DIAGNOSIS — M170 Bilateral primary osteoarthritis of knee: Secondary | ICD-10-CM | POA: Diagnosis not present

## 2020-08-08 DIAGNOSIS — J123 Human metapneumovirus pneumonia: Secondary | ICD-10-CM | POA: Diagnosis not present

## 2020-08-08 DIAGNOSIS — E876 Hypokalemia: Secondary | ICD-10-CM | POA: Diagnosis not present

## 2020-08-11 DIAGNOSIS — R600 Localized edema: Secondary | ICD-10-CM | POA: Diagnosis not present

## 2020-08-11 DIAGNOSIS — M7989 Other specified soft tissue disorders: Secondary | ICD-10-CM | POA: Diagnosis not present

## 2020-08-11 DIAGNOSIS — I493 Ventricular premature depolarization: Secondary | ICD-10-CM | POA: Diagnosis not present

## 2020-08-12 DIAGNOSIS — I493 Ventricular premature depolarization: Secondary | ICD-10-CM | POA: Diagnosis not present

## 2020-08-14 ENCOUNTER — Encounter: Payer: Self-pay | Admitting: Dermatology

## 2020-08-14 ENCOUNTER — Other Ambulatory Visit: Payer: Self-pay

## 2020-08-14 ENCOUNTER — Ambulatory Visit (INDEPENDENT_AMBULATORY_CARE_PROVIDER_SITE_OTHER): Payer: Medicare Other | Admitting: Dermatology

## 2020-08-14 DIAGNOSIS — L820 Inflamed seborrheic keratosis: Secondary | ICD-10-CM

## 2020-08-14 DIAGNOSIS — L400 Psoriasis vulgaris: Secondary | ICD-10-CM | POA: Diagnosis not present

## 2020-08-14 DIAGNOSIS — Z1283 Encounter for screening for malignant neoplasm of skin: Secondary | ICD-10-CM

## 2020-08-14 DIAGNOSIS — D485 Neoplasm of uncertain behavior of skin: Secondary | ICD-10-CM | POA: Diagnosis not present

## 2020-08-14 MED ORDER — HALOBETASOL PROPIONATE 0.05 % EX CREA: TOPICAL_CREAM | Freq: Every day | CUTANEOUS | 0 refills | Status: DC

## 2020-08-14 NOTE — Patient Instructions (Addendum)
Biopsy, Surgery (Curettage) & Surgery (Excision) Aftercare Instructions  1. Okay to remove bandage in 24 hours  2. Wash area with soap and water  3. Apply Vaseline to area twice daily until healed (Not Neosporin)  4. Okay to cover with a Band-Aid to decrease the chance of infection or prevent irritation from clothing; also it's okay to uncover lesion at home.  5. Suture instructions: return to our office in 7-10 or 10-14 days for a nurse visit for suture removal. Variable healing with sutures, if pain or itching occurs call our office. It's okay to shower or bathe 24 hours after sutures are given.  6. The following risks may occur after a biopsy, curettage or excision: bleeding, scarring, discoloration, recurrence, infection (redness, yellow drainage, pain or swelling).  7. For questions, concerns and results call our office at [place] before 4pm & Friday before 3pm. Biopsy results will be available in 1 week.   Dr. Derrill Kay @ [phone]

## 2020-08-16 DIAGNOSIS — L600 Ingrowing nail: Secondary | ICD-10-CM | POA: Diagnosis not present

## 2020-08-16 DIAGNOSIS — B351 Tinea unguium: Secondary | ICD-10-CM | POA: Diagnosis not present

## 2020-08-16 DIAGNOSIS — M79674 Pain in right toe(s): Secondary | ICD-10-CM | POA: Diagnosis not present

## 2020-08-20 DIAGNOSIS — E876 Hypokalemia: Secondary | ICD-10-CM | POA: Diagnosis not present

## 2020-08-20 DIAGNOSIS — J310 Chronic rhinitis: Secondary | ICD-10-CM | POA: Diagnosis not present

## 2020-08-20 DIAGNOSIS — F411 Generalized anxiety disorder: Secondary | ICD-10-CM | POA: Diagnosis not present

## 2020-08-20 DIAGNOSIS — K449 Diaphragmatic hernia without obstruction or gangrene: Secondary | ICD-10-CM | POA: Diagnosis not present

## 2020-08-20 DIAGNOSIS — Z87442 Personal history of urinary calculi: Secondary | ICD-10-CM | POA: Diagnosis not present

## 2020-08-20 DIAGNOSIS — F32A Depression, unspecified: Secondary | ICD-10-CM | POA: Diagnosis not present

## 2020-08-20 DIAGNOSIS — M810 Age-related osteoporosis without current pathological fracture: Secondary | ICD-10-CM | POA: Diagnosis not present

## 2020-08-20 DIAGNOSIS — J188 Other pneumonia, unspecified organism: Secondary | ICD-10-CM | POA: Diagnosis not present

## 2020-08-20 DIAGNOSIS — M170 Bilateral primary osteoarthritis of knee: Secondary | ICD-10-CM | POA: Diagnosis not present

## 2020-08-20 DIAGNOSIS — H905 Unspecified sensorineural hearing loss: Secondary | ICD-10-CM | POA: Diagnosis not present

## 2020-08-20 DIAGNOSIS — M19011 Primary osteoarthritis, right shoulder: Secondary | ICD-10-CM | POA: Diagnosis not present

## 2020-08-20 DIAGNOSIS — K219 Gastro-esophageal reflux disease without esophagitis: Secondary | ICD-10-CM | POA: Diagnosis not present

## 2020-08-20 DIAGNOSIS — H9313 Tinnitus, bilateral: Secondary | ICD-10-CM | POA: Diagnosis not present

## 2020-08-20 DIAGNOSIS — Z85828 Personal history of other malignant neoplasm of skin: Secondary | ICD-10-CM | POA: Diagnosis not present

## 2020-08-20 DIAGNOSIS — E7800 Pure hypercholesterolemia, unspecified: Secondary | ICD-10-CM | POA: Diagnosis not present

## 2020-08-20 DIAGNOSIS — I10 Essential (primary) hypertension: Secondary | ICD-10-CM | POA: Diagnosis not present

## 2020-08-20 DIAGNOSIS — R490 Dysphonia: Secondary | ICD-10-CM | POA: Diagnosis not present

## 2020-08-20 DIAGNOSIS — Z9181 History of falling: Secondary | ICD-10-CM | POA: Diagnosis not present

## 2020-08-20 DIAGNOSIS — J383 Other diseases of vocal cords: Secondary | ICD-10-CM | POA: Diagnosis not present

## 2020-08-20 DIAGNOSIS — J123 Human metapneumovirus pneumonia: Secondary | ICD-10-CM | POA: Diagnosis not present

## 2020-08-20 DIAGNOSIS — E785 Hyperlipidemia, unspecified: Secondary | ICD-10-CM | POA: Diagnosis not present

## 2020-08-20 DIAGNOSIS — E559 Vitamin D deficiency, unspecified: Secondary | ICD-10-CM | POA: Diagnosis not present

## 2020-08-20 DIAGNOSIS — R1310 Dysphagia, unspecified: Secondary | ICD-10-CM | POA: Diagnosis not present

## 2020-08-20 DIAGNOSIS — M19012 Primary osteoarthritis, left shoulder: Secondary | ICD-10-CM | POA: Diagnosis not present

## 2020-08-22 ENCOUNTER — Telehealth: Payer: Self-pay | Admitting: Dermatology

## 2020-08-22 NOTE — Telephone Encounter (Signed)
Results. Said we tried to send her message on my chart.

## 2020-08-22 NOTE — Telephone Encounter (Signed)
pathology results to patient.

## 2020-08-24 DIAGNOSIS — M1711 Unilateral primary osteoarthritis, right knee: Secondary | ICD-10-CM | POA: Diagnosis not present

## 2020-08-26 ENCOUNTER — Encounter: Payer: Self-pay | Admitting: Dermatology

## 2020-08-26 NOTE — Progress Notes (Signed)
   Follow-Up Visit   Subjective  Katie Stout is a 75 y.o. female who presents for the following: Annual Exam (Patient here today for skin check.  Per patient she has a spot on the right forearm x years that has now changed. Per patient no bleeding just redness and looks like a scab.  Per patient she also has a itchy place on her left lower back just noticed one month ago.).  General skin check.  Change in spot right forearm Location:  Duration:  Quality:  Associated Signs/Symptoms: Modifying Factors:  Severity:  Timing: Context:   Objective  Well appearing patient in no apparent distress; mood and affect are within normal limits. Objective  Head to Toe: General skin examination, no atypical moles or melanoma.  Objective  Left Breast, Left Lower Back: Inflamed pink-tan textured papule  Objective  Right Forearm - Posterior: 7 mm focally hemorrhagic crust         A full examination was performed including scalp, head, eyes, ears, nose, lips, neck, chest, axillae, abdomen, back, buttocks, bilateral upper extremities, bilateral lower extremities, hands, feet, fingers, toes, fingernails, and toenails. All findings within normal limits unless otherwise noted below.   Assessment & Plan    Skin exam for malignant neoplasm Head to Toe  Yearly skin check  Inflamed seborrheic keratosis (2) Left Breast; Left Lower Back  Destruction of lesion - Left Breast, Left Lower Back Complexity: simple   Destruction method: cryotherapy   Informed consent: discussed and consent obtained   Timeout:  patient name, date of birth, surgical site, and procedure verified Lesion destroyed using liquid nitrogen: Yes   Cryotherapy cycles:  1 Outcome: patient tolerated procedure well with no complications   Post-procedure details: wound care instructions given    Neoplasm of uncertain behavior of skin Right Forearm - Posterior  Skin / nail biopsy Type of biopsy: tangential   Informed  consent: discussed and consent obtained   Timeout: patient name, date of birth, surgical site, and procedure verified   Procedure prep:  Patient was prepped and draped in usual sterile fashion (Non sterile) Prep type:  Chlorhexidine Anesthesia: the lesion was anesthetized in a standard fashion   Anesthetic:  1% lidocaine w/ epinephrine 1-100,000 local infiltration Instrument used: flexible razor blade   Hemostasis achieved with: ferric subsulfate   Outcome: patient tolerated procedure well   Post-procedure details: sterile dressing applied and wound care instructions given   Dressing type: bandage and petrolatum    Specimen 1 - Surgical pathology Differential Diagnosis: R/O BCC vs SCC vs ISK  Check Margins: No  Treated after biopsy  After shave biopsy the base and margins were treated with triple curettage plus cautery.  1.1 cm  Psoriasis vulgaris (3) Left Hand - Anterior; Right Hand - Anterior; Left Lower Leg - Anterior  halobetasol (ULTRAVATE) 0.05 % cream - Left Hand - Anterior, Left Lower Leg - Anterior, Right Hand - Anterior      I, Lavonna Monarch, MD, have reviewed all documentation for this visit.  The documentation on 08/26/20 for the exam, diagnosis, procedures, and orders are all accurate and complete.

## 2020-09-08 DIAGNOSIS — I10 Essential (primary) hypertension: Secondary | ICD-10-CM | POA: Diagnosis not present

## 2020-09-08 DIAGNOSIS — K219 Gastro-esophageal reflux disease without esophagitis: Secondary | ICD-10-CM | POA: Diagnosis not present

## 2020-09-08 DIAGNOSIS — J123 Human metapneumovirus pneumonia: Secondary | ICD-10-CM | POA: Diagnosis not present

## 2020-09-08 DIAGNOSIS — J188 Other pneumonia, unspecified organism: Secondary | ICD-10-CM | POA: Diagnosis not present

## 2020-09-08 DIAGNOSIS — E876 Hypokalemia: Secondary | ICD-10-CM | POA: Diagnosis not present

## 2020-09-08 DIAGNOSIS — M170 Bilateral primary osteoarthritis of knee: Secondary | ICD-10-CM | POA: Diagnosis not present

## 2020-09-15 DIAGNOSIS — R5383 Other fatigue: Secondary | ICD-10-CM | POA: Diagnosis not present

## 2020-09-15 DIAGNOSIS — R0902 Hypoxemia: Secondary | ICD-10-CM | POA: Diagnosis not present

## 2020-09-15 DIAGNOSIS — G4489 Other headache syndrome: Secondary | ICD-10-CM | POA: Diagnosis not present

## 2020-09-15 DIAGNOSIS — R410 Disorientation, unspecified: Secondary | ICD-10-CM | POA: Diagnosis not present

## 2020-09-15 DIAGNOSIS — R519 Headache, unspecified: Secondary | ICD-10-CM | POA: Diagnosis not present

## 2020-09-15 DIAGNOSIS — G459 Transient cerebral ischemic attack, unspecified: Secondary | ICD-10-CM | POA: Diagnosis not present

## 2020-09-15 DIAGNOSIS — R42 Dizziness and giddiness: Secondary | ICD-10-CM | POA: Diagnosis not present

## 2020-09-20 DIAGNOSIS — Z9629 Presence of other otological and audiological implants: Secondary | ICD-10-CM | POA: Diagnosis not present

## 2020-09-20 DIAGNOSIS — Z4589 Encounter for adjustment and management of other implanted devices: Secondary | ICD-10-CM | POA: Diagnosis not present

## 2020-09-20 DIAGNOSIS — H903 Sensorineural hearing loss, bilateral: Secondary | ICD-10-CM | POA: Diagnosis not present

## 2020-09-25 DIAGNOSIS — F418 Other specified anxiety disorders: Secondary | ICD-10-CM | POA: Diagnosis not present

## 2020-09-25 DIAGNOSIS — R42 Dizziness and giddiness: Secondary | ICD-10-CM | POA: Diagnosis not present

## 2020-09-25 DIAGNOSIS — I10 Essential (primary) hypertension: Secondary | ICD-10-CM | POA: Diagnosis not present

## 2020-10-03 ENCOUNTER — Ambulatory Visit: Payer: Medicare Other | Admitting: Internal Medicine

## 2020-11-03 DIAGNOSIS — Z20822 Contact with and (suspected) exposure to covid-19: Secondary | ICD-10-CM | POA: Diagnosis not present

## 2020-11-03 DIAGNOSIS — M791 Myalgia, unspecified site: Secondary | ICD-10-CM | POA: Diagnosis not present

## 2020-11-03 DIAGNOSIS — J101 Influenza due to other identified influenza virus with other respiratory manifestations: Secondary | ICD-10-CM | POA: Diagnosis not present

## 2020-11-03 DIAGNOSIS — R509 Fever, unspecified: Secondary | ICD-10-CM | POA: Diagnosis not present

## 2020-11-08 DIAGNOSIS — J019 Acute sinusitis, unspecified: Secondary | ICD-10-CM | POA: Diagnosis not present

## 2020-11-10 ENCOUNTER — Other Ambulatory Visit: Payer: Self-pay | Admitting: Internal Medicine

## 2020-11-10 DIAGNOSIS — E785 Hyperlipidemia, unspecified: Secondary | ICD-10-CM

## 2020-11-10 DIAGNOSIS — E876 Hypokalemia: Secondary | ICD-10-CM

## 2020-11-10 DIAGNOSIS — I10 Essential (primary) hypertension: Secondary | ICD-10-CM

## 2020-11-10 DIAGNOSIS — E559 Vitamin D deficiency, unspecified: Secondary | ICD-10-CM

## 2020-11-14 DIAGNOSIS — R509 Fever, unspecified: Secondary | ICD-10-CM | POA: Diagnosis not present

## 2020-11-14 DIAGNOSIS — R52 Pain, unspecified: Secondary | ICD-10-CM | POA: Diagnosis not present

## 2020-11-14 DIAGNOSIS — J029 Acute pharyngitis, unspecified: Secondary | ICD-10-CM | POA: Diagnosis not present

## 2020-11-17 DIAGNOSIS — R319 Hematuria, unspecified: Secondary | ICD-10-CM | POA: Diagnosis not present

## 2020-12-16 ENCOUNTER — Other Ambulatory Visit: Payer: Self-pay | Admitting: Internal Medicine

## 2020-12-16 DIAGNOSIS — E559 Vitamin D deficiency, unspecified: Secondary | ICD-10-CM

## 2020-12-16 DIAGNOSIS — E785 Hyperlipidemia, unspecified: Secondary | ICD-10-CM

## 2020-12-16 DIAGNOSIS — E876 Hypokalemia: Secondary | ICD-10-CM

## 2020-12-16 DIAGNOSIS — I10 Essential (primary) hypertension: Secondary | ICD-10-CM

## 2020-12-26 DIAGNOSIS — K449 Diaphragmatic hernia without obstruction or gangrene: Secondary | ICD-10-CM | POA: Diagnosis not present

## 2020-12-26 DIAGNOSIS — K224 Dyskinesia of esophagus: Secondary | ICD-10-CM | POA: Diagnosis not present

## 2020-12-26 DIAGNOSIS — G8929 Other chronic pain: Secondary | ICD-10-CM | POA: Diagnosis not present

## 2020-12-26 DIAGNOSIS — R319 Hematuria, unspecified: Secondary | ICD-10-CM | POA: Diagnosis not present

## 2020-12-26 DIAGNOSIS — R531 Weakness: Secondary | ICD-10-CM | POA: Diagnosis not present

## 2020-12-26 DIAGNOSIS — F418 Other specified anxiety disorders: Secondary | ICD-10-CM | POA: Diagnosis not present

## 2020-12-26 DIAGNOSIS — M19041 Primary osteoarthritis, right hand: Secondary | ICD-10-CM | POA: Diagnosis not present

## 2020-12-26 DIAGNOSIS — Z9189 Other specified personal risk factors, not elsewhere classified: Secondary | ICD-10-CM | POA: Diagnosis not present

## 2020-12-26 DIAGNOSIS — E782 Mixed hyperlipidemia: Secondary | ICD-10-CM | POA: Diagnosis not present

## 2020-12-26 DIAGNOSIS — M25571 Pain in right ankle and joints of right foot: Secondary | ICD-10-CM | POA: Diagnosis not present

## 2020-12-26 DIAGNOSIS — K219 Gastro-esophageal reflux disease without esophagitis: Secondary | ICD-10-CM | POA: Diagnosis not present

## 2020-12-26 DIAGNOSIS — E611 Iron deficiency: Secondary | ICD-10-CM | POA: Diagnosis not present

## 2020-12-26 DIAGNOSIS — I10 Essential (primary) hypertension: Secondary | ICD-10-CM | POA: Diagnosis not present

## 2021-01-10 DIAGNOSIS — N764 Abscess of vulva: Secondary | ICD-10-CM | POA: Diagnosis not present

## 2021-02-03 DIAGNOSIS — U071 COVID-19: Secondary | ICD-10-CM | POA: Diagnosis not present

## 2021-02-22 DIAGNOSIS — M79641 Pain in right hand: Secondary | ICD-10-CM | POA: Diagnosis not present

## 2021-02-22 DIAGNOSIS — R062 Wheezing: Secondary | ICD-10-CM | POA: Diagnosis not present

## 2021-02-22 DIAGNOSIS — M79642 Pain in left hand: Secondary | ICD-10-CM | POA: Diagnosis not present

## 2021-02-22 DIAGNOSIS — U099 Post covid-19 condition, unspecified: Secondary | ICD-10-CM | POA: Diagnosis not present

## 2021-03-21 DIAGNOSIS — M8589 Other specified disorders of bone density and structure, multiple sites: Secondary | ICD-10-CM | POA: Diagnosis not present

## 2021-03-21 DIAGNOSIS — N959 Unspecified menopausal and perimenopausal disorder: Secondary | ICD-10-CM | POA: Diagnosis not present

## 2021-03-21 DIAGNOSIS — Z13820 Encounter for screening for osteoporosis: Secondary | ICD-10-CM | POA: Diagnosis not present

## 2021-03-21 DIAGNOSIS — Z9189 Other specified personal risk factors, not elsewhere classified: Secondary | ICD-10-CM | POA: Diagnosis not present

## 2021-03-21 DIAGNOSIS — Z780 Asymptomatic menopausal state: Secondary | ICD-10-CM | POA: Diagnosis not present

## 2021-03-21 DIAGNOSIS — M8580 Other specified disorders of bone density and structure, unspecified site: Secondary | ICD-10-CM | POA: Diagnosis not present

## 2021-03-21 DIAGNOSIS — Z1231 Encounter for screening mammogram for malignant neoplasm of breast: Secondary | ICD-10-CM | POA: Diagnosis not present

## 2021-03-27 DIAGNOSIS — H353131 Nonexudative age-related macular degeneration, bilateral, early dry stage: Secondary | ICD-10-CM | POA: Diagnosis not present

## 2021-03-27 DIAGNOSIS — H43813 Vitreous degeneration, bilateral: Secondary | ICD-10-CM | POA: Diagnosis not present

## 2021-03-27 DIAGNOSIS — Z961 Presence of intraocular lens: Secondary | ICD-10-CM | POA: Diagnosis not present

## 2021-03-27 DIAGNOSIS — H04123 Dry eye syndrome of bilateral lacrimal glands: Secondary | ICD-10-CM | POA: Diagnosis not present

## 2021-04-02 DIAGNOSIS — E8581 Light chain (AL) amyloidosis: Secondary | ICD-10-CM | POA: Diagnosis not present

## 2021-04-02 DIAGNOSIS — Q310 Web of larynx: Secondary | ICD-10-CM | POA: Diagnosis not present

## 2021-04-02 DIAGNOSIS — J383 Other diseases of vocal cords: Secondary | ICD-10-CM | POA: Diagnosis not present

## 2021-04-02 DIAGNOSIS — J387 Other diseases of larynx: Secondary | ICD-10-CM | POA: Diagnosis not present

## 2021-04-02 DIAGNOSIS — J384 Edema of larynx: Secondary | ICD-10-CM | POA: Diagnosis not present

## 2021-06-05 DIAGNOSIS — H9313 Tinnitus, bilateral: Secondary | ICD-10-CM | POA: Diagnosis not present

## 2021-06-05 DIAGNOSIS — Z974 Presence of external hearing-aid: Secondary | ICD-10-CM | POA: Diagnosis not present

## 2021-06-05 DIAGNOSIS — H903 Sensorineural hearing loss, bilateral: Secondary | ICD-10-CM | POA: Diagnosis not present

## 2021-06-23 ENCOUNTER — Other Ambulatory Visit: Payer: Self-pay | Admitting: Dermatology

## 2021-06-23 DIAGNOSIS — L400 Psoriasis vulgaris: Secondary | ICD-10-CM

## 2021-06-27 DIAGNOSIS — H906 Mixed conductive and sensorineural hearing loss, bilateral: Secondary | ICD-10-CM | POA: Diagnosis not present

## 2021-06-27 DIAGNOSIS — E782 Mixed hyperlipidemia: Secondary | ICD-10-CM | POA: Diagnosis not present

## 2021-06-27 DIAGNOSIS — R531 Weakness: Secondary | ICD-10-CM | POA: Diagnosis not present

## 2021-06-27 DIAGNOSIS — F411 Generalized anxiety disorder: Secondary | ICD-10-CM | POA: Diagnosis not present

## 2021-06-27 DIAGNOSIS — Z6836 Body mass index (BMI) 36.0-36.9, adult: Secondary | ICD-10-CM | POA: Diagnosis not present

## 2021-06-27 DIAGNOSIS — D352 Benign neoplasm of pituitary gland: Secondary | ICD-10-CM | POA: Diagnosis not present

## 2021-06-27 DIAGNOSIS — D489 Neoplasm of uncertain behavior, unspecified: Secondary | ICD-10-CM | POA: Diagnosis not present

## 2021-06-27 DIAGNOSIS — R682 Dry mouth, unspecified: Secondary | ICD-10-CM | POA: Diagnosis not present

## 2021-06-27 DIAGNOSIS — M170 Bilateral primary osteoarthritis of knee: Secondary | ICD-10-CM | POA: Diagnosis not present

## 2021-06-27 DIAGNOSIS — R609 Edema, unspecified: Secondary | ICD-10-CM | POA: Diagnosis not present

## 2021-06-27 DIAGNOSIS — F5101 Primary insomnia: Secondary | ICD-10-CM | POA: Diagnosis not present

## 2021-06-27 DIAGNOSIS — I10 Essential (primary) hypertension: Secondary | ICD-10-CM | POA: Diagnosis not present

## 2021-06-27 DIAGNOSIS — M159 Polyosteoarthritis, unspecified: Secondary | ICD-10-CM | POA: Diagnosis not present

## 2021-06-27 DIAGNOSIS — Z0000 Encounter for general adult medical examination without abnormal findings: Secondary | ICD-10-CM | POA: Diagnosis not present

## 2021-06-27 DIAGNOSIS — E6609 Other obesity due to excess calories: Secondary | ICD-10-CM | POA: Diagnosis not present

## 2021-06-27 DIAGNOSIS — R600 Localized edema: Secondary | ICD-10-CM | POA: Diagnosis not present

## 2021-06-27 DIAGNOSIS — R29898 Other symptoms and signs involving the musculoskeletal system: Secondary | ICD-10-CM | POA: Diagnosis not present

## 2021-08-22 ENCOUNTER — Telehealth: Payer: Self-pay | Admitting: Neurology

## 2021-08-22 NOTE — Telephone Encounter (Signed)
Pt called in stating she had moved to the other side of [place] and began seeing [facility]. She is interesting in transferring care back to our office and would like to know how to get that started?

## 2021-08-23 NOTE — Telephone Encounter (Signed)
Patient said injections were quite painful and she would prefer to drive to [facility] the 3-4 times a year to see Dr. Carles Collet and get her Botox injections here at our office. Patient had only nice things to say about Dr. Hall Busing but said there were more injections and that they were painful . Will send to [facility] for scheduling this patient for an office set up appointment for patient

## 2021-08-31 NOTE — Progress Notes (Signed)
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  Last injections were November, 2021 at [facility].  Obviously, this was a very long time ago and symptoms are markedly worse.  -Last injections here were January, 2021.  She actually did fairly well with that and we will reinitiate that pattern of injections.  This primarily involves the left sternocleidomastoid and right splenius capitis.  2.  Hand tremor  -She has noticed this over the recent years.  No evidence of Parkinson's disease.  Does not appear dystonic.  Did not see a lot on examination today.  No medication recommended.   Subjective:   Katie Stout was seen today in follow up for cervical dystonia.  I have not seen the patient in over 2 years.  Patient had been seen for many years by Dr. Gilford [name] at [facility] and then transferred care to me, with last injections with me in January, 2021.  She then transferred her care back to [facility] with Dr. Hall Busing, but she had a long period where she had no injections, between January, 2021 and November, 2021.  When she saw Dr. Hall Busing in November, 2021, it appears that the intention was to follow the pattern of injections that she had here, but the patient requested that the procedure be discontinued before completed and they wasted 110 units of Botox.  Pt states that the injections were painful.  Currently, she is having more neck pain. She will note some tremor because of the pain.  She is noting some hand tremor.     CURRENT MEDICATIONS:  Outpatient Encounter Medications as of 09/04/2021  Medication Sig   acetaminophen (TYLENOL) 500 MG tablet Take by mouth.   albuterol (VENTOLIN HFA) 108 (90 Base) MCG/ACT inhaler SMARTSIG:2 Puff(s) By Mouth Every 6 Hours PRN   ALPRAZolam (XANAX) 0.25 MG tablet TAKE 1 TABLET (0.25 MG TOTAL) BY MOUTH AT BEDTIME AS NEEDED FOR SLEEP.   Artificial Saliva (BIOTENE ORALBALANCE DRY MOUTH) GEL Take by mouth.   calcium carbonate (TUMS EX) 750 MG  chewable tablet Chew 2-4 tablets by mouth daily.   Cholecalciferol 2000 units TABS Take 1 tablet (2,000 Units total) by mouth daily.   fexofenadine (ALLEGRA) 30 MG tablet Take 30 mg by mouth 2 (two) times daily.   fluticasone (FLONASE) 50 MCG/ACT nasal spray 1 spray daily.   furosemide (LASIX) 20 MG tablet Take 1 tablet by mouth daily.   halobetasol (ULTRAVATE) 0.05 % cream APPLY TO AFFECTED AREA EVERY DAY AT BEDTIME   hyoscyamine (LEVSIN SL) 0.125 MG SL tablet PLACE 1 TABLET (0.125 MG TOTAL) UNDER THE TONGUE EVERY 4 (FOUR) HOURS AS NEEDED FOR CRAMPING.   metoprolol succinate (TOPROL-XL) 25 MG 24 hr tablet Take 25 mg by mouth daily.   Multiple Vitamins-Minerals (PRESERVISION AREDS 2) CAPS Take by mouth.   omeprazole (PRILOSEC) 20 MG capsule TAKE 1 CAPSULE BY MOUTH EVERY DAY   omeprazole (PRILOSEC) 20 MG capsule Take by mouth.   rosuvastatin (CRESTOR) 10 MG tablet TAKE 1 TABLET BY MOUTH EVERY DAY   spironolactone (ALDACTONE) 25 MG tablet TAKE 1 TABLET BY MOUTH EVERY DAY   [DISCONTINUED] azithromycin (ZITHROMAX) 250 MG tablet 2 tabs po qd x 1 day; 1 tablet per day x 4 days;   [DISCONTINUED] benzonatate (TESSALON) 100 MG capsule Take by mouth.   [DISCONTINUED] busPIRone (BUSPAR) 10 MG tablet Take 10 mg by mouth 2 (two) times daily as needed.   [DISCONTINUED] guaiFENesin (MUCINEX) 600 MG 12 hr tablet  Take by mouth.   [DISCONTINUED] moxifloxacin (AVELOX) 400 MG tablet Take 400 mg by mouth daily.   [DISCONTINUED] olopatadine (PATANOL) 0.1 % ophthalmic solution Place 1 drop into both eyes daily.   [DISCONTINUED] simethicone (MYLICON) 570 MG chewable tablet Chew by mouth.   [DISCONTINUED] venlafaxine XR (EFFEXOR-XR) 37.5 MG 24 hr capsule Take 37.5 mg by mouth daily.   No facility-administered encounter medications on file as of 09/04/2021.     Objective:   PHYSICAL EXAMINATION:    VITALS:   Vitals:   09/04/21 0857  BP: 131/72  Pulse: 71  SpO2: 98%  Weight: 200 lb 6.4 oz (90.9 kg)  Height:  5\' 5"  (1.651 m)    GEN:  The patient appears stated age and is in NAD. HEENT:  Normocephalic, atraumatic.  The mucous membranes are moist. The superficial temporal arteries are without ropiness or tenderness. CV:  RRR Lungs:  CTAB Neck/HEME:  There are no carotid bruits bilaterally.  There is very significant hypertrophy of the left sternocleidomastoid.  There is palpable hypertrophy of the left levator scapulae.  There is trapezius tenderness and hypertrophy, L more than R.  Neurological examination:  Orientation: The patient is alert and oriented x3. Cranial nerves: There is good facial symmetry.The speech is fluent and clear. Soft palate rises symmetrically and there is no tongue deviation. Hearing is intact to conversational tone. Sensation: Sensation is intact to light touch throughout Motor: Strength is at least antigravity x4.  Movement examination: Tone: There is normal tone in the UE/LE Abnormal movements:  no tremor of the hands.  She does have some of the head.  No myoclonus.  No asterixis.   Coordination:  There is no decremation with RAM's. Gait and Station: The patient has no difficulty arising out of a deep-seated chair without the use of the hands. The patient's stride length is good.      Total time spent on today's visit was 30 minutes, including both face-to-face time and nonface-to-face time.  Time included that spent on review of records (prior notes available to me/labs/imaging if pertinent), discussing treatment and goals, answering patient's questions and coordinating care.  Cc:  Shayne Alken, MD

## 2021-09-04 ENCOUNTER — Other Ambulatory Visit: Payer: Self-pay

## 2021-09-04 ENCOUNTER — Encounter: Payer: Self-pay | Admitting: Neurology

## 2021-09-04 ENCOUNTER — Telehealth: Payer: Self-pay

## 2021-09-04 ENCOUNTER — Ambulatory Visit (INDEPENDENT_AMBULATORY_CARE_PROVIDER_SITE_OTHER): Payer: Medicare Other | Admitting: Neurology

## 2021-09-04 VITALS — BP 131/72 | HR 71 | Ht 65.0 in | Wt 200.4 lb

## 2021-09-04 DIAGNOSIS — G243 Spasmodic torticollis: Secondary | ICD-10-CM | POA: Diagnosis not present

## 2021-09-04 DIAGNOSIS — R251 Tremor, unspecified: Secondary | ICD-10-CM

## 2021-09-04 NOTE — Telephone Encounter (Signed)
New message   Benefit Verification [id number] Submitted! For BV Basic submissions, please allow 1 business day for results.  For BV Full submissions, please allow 2 business days for results.

## 2021-09-04 NOTE — Telephone Encounter (Signed)
New message   Quantity :3  Botox 100 unit   Your information has been submitted to [facility]. To check for an updated outcome later, reopen this PA request from your dashboard.  If [employer] has not responded to your request within 24 hours, contact [facility] at [phone]. If you think there may be a problem with your PA request, use our live chat feature at the bottom right.  Katie Stout Key: [id number] - PA Case ID: [id number] Need help? Call us at [phone] Status Sent to [facility] Botox 100UNIT solution Form [employer] Electronic PA Form [phone] NCPDP)

## 2021-09-05 NOTE — Telephone Encounter (Signed)
F/u  Message:  The Prior Authorization request has been approved for Botox 100 units/ml.  The authorization is valid from 08/05/2021 through 09/04/2022. A letter of explanation will also be mailed to the patient.

## 2021-09-05 NOTE — Telephone Encounter (Signed)
F/u  BOTOX (onabotulinumtoxinA)  ACQUISITION - [place] List of Specialty Pharmacies may not include all available options. If preferred Specialty Pharmacy is not listed, check with payer.  [?]Buy and Bill [ ]  Buy and [place] Available [ ]  Specialty Pharmacy Required [ ]  Prescription Coverage Only - See Pharmacy Benefits Section [ ]  Payer will not release information Specialty Pharmacies:     [facility] The medical plan's renewal date is 07/08/2022. This plan runs on a calendar year basis. The local Medicare Administrative Contractor's published Local Coverage Article LCA [id number], listed the diagnosis G24.3, CPT and J-code on the coverage policy. For additional payer coverage criteria, please reference the BOTOX Policies and Forms link available at [email or web address].

## 2021-09-21 ENCOUNTER — Ambulatory Visit (INDEPENDENT_AMBULATORY_CARE_PROVIDER_SITE_OTHER): Payer: Medicare Other | Admitting: Neurology

## 2021-09-21 ENCOUNTER — Other Ambulatory Visit: Payer: Self-pay

## 2021-09-21 DIAGNOSIS — G243 Spasmodic torticollis: Secondary | ICD-10-CM

## 2021-09-21 MED ORDER — ONABOTULINUMTOXINA 100 UNITS IJ SOLR
300.0000 [IU] | Freq: Once | INTRAMUSCULAR | Status: AC
  Administered 2021-09-21: 240 [IU] via INTRAMUSCULAR

## 2021-09-21 NOTE — Procedures (Signed)
[facility] Clinic  ? ?Procedure Note Botox ? ?Attending: Dr. Wells Guiles Kashmir Leedy ? ?Preoperative Diagnosis(es): Cervical Dystonia ? ?Result History  ?Worked well several years ago but hasn't done injections for a long time ? ?Consent obtained from: The patient ?Benefits discussed included, but were not limited to decreased muscle tightness, increased joint range of motion, and decreased pain.  Risk discussed included, but were not limited pain and discomfort, bleeding, bruising, excessive weakness, venous thrombosis, muscle atrophy and dysphagia.  A copy of the patient medication guide was given to the patient which explains the blackbox warning. ? ?Patients identity and treatment sites confirmed Yes.  . ? ?Details of Procedure: ?Skin was cleaned with alcohol.  A 30 gauge, 1/2 inch needle was introduced to the target muscle (except splenius capitus, posterior approach, where 27 inch, 1 1/2 gauge needle was used).  Prior to injection, the needle plunger was aspirated to make sure the needle was not within a blood vessel.  There was no blood retrieved on aspiration.   ? ?Following is a summary of the muscles injected  And the amount of Botulinum toxin used: ? ? ?Dilution ?0.9% preservative free saline mixed with 100 u Botox type A to make 10 U per 0.1cc ? ?Injections  ?Location Left  Right Units Number of sites  ?      ?Sternocleidomastoid 70  70 1  ?Splenius Capitus, posterior approach  80 80 1  ?Splenius Capitus, lateral approach  40 40 1  ?Levator Scapulae      ?Trapezius 20/20/10  50 3  ?      ?TOTAL UNITS:   240   ? ?Agent: Botulinum Type A ( Onobotulinum Toxin type A ).  3 vials of Botox were used, each containing 100 units and freshly diluted with 1 mL of sterile, non-preserved saline ? ? Total injected (Units): 240 ? Total wasted (Units): 60 ? ? ?Pt tolerated procedure well without complications.   ?Reinjection is anticipated in 3 months. ?

## 2021-09-25 ENCOUNTER — Ambulatory Visit: Payer: Medicare Other | Admitting: Dermatology

## 2021-12-20 ENCOUNTER — Telehealth: Payer: Self-pay | Admitting: Internal Medicine

## 2021-12-20 NOTE — Telephone Encounter (Signed)
See note below from pt and advise of any recommendation.

## 2021-12-20 NOTE — Telephone Encounter (Signed)
Please let patient know that I do not note any individual provider specifically however I do note that they have the following office: [facility] [address]  [place], [place] [address] Appointments    [phone]  [phone])

## 2021-12-20 NOTE — Telephone Encounter (Signed)
Patient notified via mychart

## 2021-12-20 NOTE — Telephone Encounter (Signed)
Patient called stating she has moved and wanted to know if you knew or had any recommendations for a gastroenterologist in [facility] with the [place].  She is experiencing pain in her hernia area and feels like she needs another endoscopy.  Please advise.  Thank you.

## 2021-12-21 ENCOUNTER — Ambulatory Visit: Payer: Medicare Other | Admitting: Neurology

## 2021-12-28 ENCOUNTER — Ambulatory Visit: Payer: Medicare Other | Admitting: Neurology

## 2022-01-25 ENCOUNTER — Telehealth: Payer: Self-pay | Admitting: Neurology

## 2022-01-25 NOTE — Telephone Encounter (Signed)
Called patient and spoke to her about her prior authorization status. She understood

## 2022-01-25 NOTE — Telephone Encounter (Signed)
Patient called and said she'd like to continue with getting Botox but it has been since February and she wants to make sure there doesn't need to be another authorization done first.

## 2022-03-19 ENCOUNTER — Telehealth: Payer: Self-pay | Admitting: Neurology

## 2022-03-19 NOTE — Telephone Encounter (Signed)
Called patient and let her know its ok to get flu shot and Botox

## 2022-03-19 NOTE — Telephone Encounter (Signed)
Patient called and said she has a flu vaccine scheduled tomorrow and Botox on Friday, 03/22/22.  Is there any concern with having those so close together?

## 2022-03-22 ENCOUNTER — Ambulatory Visit (INDEPENDENT_AMBULATORY_CARE_PROVIDER_SITE_OTHER): Payer: Medicare Other | Admitting: Neurology

## 2022-03-22 DIAGNOSIS — G243 Spasmodic torticollis: Secondary | ICD-10-CM | POA: Diagnosis not present

## 2022-03-22 MED ORDER — ONABOTULINUMTOXINA 100 UNITS IJ SOLR
300.0000 [IU] | Freq: Once | INTRAMUSCULAR | Status: AC
  Administered 2022-03-22: 240 [IU] via INTRAMUSCULAR

## 2022-03-22 NOTE — Procedures (Signed)
Botulinum Clinic   Procedure Note Botox  Attending: Dr. Wells Guiles Kambria Grima  Preoperative Diagnosis(es): Cervical Dystonia  Result History  Helped when she last did it, but has not had injections in 6 months because of other medical problems going on.  Consent obtained from: The patient Benefits discussed included, but were not limited to decreased muscle tightness, increased joint range of motion, and decreased pain.  Risk discussed included, but were not limited pain and discomfort, bleeding, bruising, excessive weakness, venous thrombosis, muscle atrophy and dysphagia.  A copy of the patient medication guide was given to the patient which explains the blackbox warning.  Patients identity and treatment sites confirmed Yes.  .  Details of Procedure: Skin was cleaned with alcohol.  A 30 gauge, 1/2 inch needle was introduced to the target muscle (except splenius capitus, posterior approach, where 27 inch, 1 1/2 gauge needle was used).  Prior to injection, the needle plunger was aspirated to make sure the needle was not within a blood vessel.  There was no blood retrieved on aspiration.    Following is a summary of the muscles injected  And the amount of Botulinum toxin used:   Dilution 0.9% preservative free saline mixed with 100 u Botox type A to make 10 U per 0.1cc  Injections  Location Left  Right Units Number of sites        Sternocleidomastoid 70  70 1  Splenius Capitus, posterior approach  80 80 1  Splenius Capitus, lateral approach  40 40 1  Levator Scapulae      Trapezius 20/20/10  50 3        TOTAL UNITS:   240    Agent: Botulinum Type A ( Onobotulinum Toxin type A ).  3 vials of Botox were used, each containing 100 units and freshly diluted with 1 mL of sterile, non-preserved saline   Total injected (Units): 240  Total wasted (Units): 60   Pt tolerated procedure well without complications.   Reinjection is anticipated in 3 months.

## 2022-06-21 ENCOUNTER — Ambulatory Visit (INDEPENDENT_AMBULATORY_CARE_PROVIDER_SITE_OTHER): Payer: Medicare Other | Admitting: Neurology

## 2022-06-21 ENCOUNTER — Ambulatory Visit: Payer: Medicare Other | Admitting: Neurology

## 2022-06-21 DIAGNOSIS — G243 Spasmodic torticollis: Secondary | ICD-10-CM | POA: Diagnosis not present

## 2022-06-21 MED ORDER — ONABOTULINUMTOXINA 100 UNITS IJ SOLR
300.0000 [IU] | Freq: Once | INTRAMUSCULAR | Status: AC
  Administered 2022-06-21: 240 [IU] via INTRAMUSCULAR

## 2022-06-21 NOTE — Procedures (Signed)
[facility]ulinum Clinic   Procedure Note Botox  Attending: Dr. Wells Guiles Anginette Espejo  Preoperative Diagnosis(es): Cervical Dystonia  Result History  Worked well last visit without SE.  No swallow trouble  Consent obtained from: The patient Benefits discussed included, but were not limited to decreased muscle tightness, increased joint range of motion, and decreased pain.  Risk discussed included, but were not limited pain and discomfort, bleeding, bruising, excessive weakness, venous thrombosis, muscle atrophy and dysphagia.  A copy of the patient medication guide was given to the patient which explains the blackbox warning.  Patients identity and treatment sites confirmed Yes.  .  Details of Procedure: Skin was cleaned with alcohol.  A 30 gauge, 1/2 inch needle was introduced to the target muscle (except splenius capitus, posterior approach, where 27 inch, 1 1/2 gauge needle was used).  Prior to injection, the needle plunger was aspirated to make sure the needle was not within a blood vessel.  There was no blood retrieved on aspiration.    Following is a summary of the muscles injected  And the amount of Botulinum toxin used:   Dilution 0.9% preservative free saline mixed with 100 u Botox type A to make 10 U per 0.1cc  Injections  Location Left  Right Units Number of sites        Sternocleidomastoid 70  70 1  Splenius Capitus, posterior approach  80 80 1  Splenius Capitus, lateral approach  40 40 1  Levator Scapulae      Trapezius 20/20/10  50 3        TOTAL UNITS:   240    Agent: Botulinum Type A ( Onobotulinum Toxin type A ).  3 vials of Botox were used, each containing 100 units and freshly diluted with 1 mL of sterile, non-preserved saline   Total injected (Units): 240  Total wasted (Units): 60   Pt tolerated procedure well without complications.   Reinjection is anticipated in 3 months.

## 2022-07-04 ENCOUNTER — Telehealth: Payer: Self-pay | Admitting: Anesthesiology

## 2022-07-04 NOTE — Telephone Encounter (Signed)
Pt called back in returning Chelsea's call

## 2022-07-04 NOTE — Telephone Encounter (Signed)
Called patient back and gave her Dr. Arturo Morton recommendations patient understand

## 2022-07-04 NOTE — Telephone Encounter (Signed)
Called patient and was unable to leave voicemail it was full

## 2022-07-04 NOTE — Telephone Encounter (Signed)
Pt called to inform Dr Tat that a week after receiving her Botox she started having trouble swallowing. When she drinks water she feels like it gets stuck on her throat. Food its hard swallow. It did not happen the time before she received her Botox this time it did. Pt states she is not complaining she just wants to inform Dr Tat about it.

## 2022-07-18 ENCOUNTER — Telehealth: Payer: Self-pay | Admitting: Neurology

## 2022-07-18 NOTE — Telephone Encounter (Signed)
Patient would like to try one more set of injections and see how those go and if she has issues she will not do it after then . She asked about a little less in each muscle

## 2022-07-18 NOTE — Telephone Encounter (Signed)
Pt called in to give an update on her swallowing issue. She says it's gotten better. This past weekend was when she started to see a difference and yesterday she had a larger change. She thinks it went on about 2-3 weeks. She says that is consistent to what has happened in years past.   She has not noticed much difference in her head tilt.  She also stated her insurance let her know the prior authorization for her botox will expire in February.

## 2022-08-21 ENCOUNTER — Telehealth: Payer: Self-pay

## 2022-08-21 NOTE — Telephone Encounter (Signed)
Patient is calling in rescheduling the botox injection to April, wants to make sure that she will be covered when she comes in next as her approval expires this month.

## 2022-09-20 ENCOUNTER — Other Ambulatory Visit (HOSPITAL_COMMUNITY): Payer: Self-pay

## 2022-09-20 ENCOUNTER — Ambulatory Visit: Payer: Medicare Other | Admitting: Neurology

## 2022-09-20 NOTE — Telephone Encounter (Signed)
Submitted update Botox One Review:- Awaiting response to verify Medical benefit billing, previous year no PA was required for [employer] FEP for Buy/Bill.   Botox PA approved through pharmacy benefit through 09/20/23- Copay is $185.00. Patient's who are eligible for Medicare, and still have Commercial pharmacy benefits are not eligible for Botox copay card.  Key: [id number]

## 2022-10-25 ENCOUNTER — Ambulatory Visit: Payer: Medicare Other | Admitting: Neurology

## 2023-03-26 ENCOUNTER — Telehealth: Payer: Self-pay | Admitting: Neurology

## 2023-03-26 ENCOUNTER — Telehealth: Payer: Self-pay | Admitting: Pharmacy Technician

## 2023-03-26 NOTE — Telephone Encounter (Signed)
Caller stated she is in discomfort and want's to get the process started back up on getting botox

## 2023-03-26 NOTE — Telephone Encounter (Signed)
Sent to PA team for authorization called [employer] and made her aware

## 2023-03-27 ENCOUNTER — Other Ambulatory Visit (HOSPITAL_COMMUNITY): Payer: Self-pay

## 2023-03-27 NOTE — Telephone Encounter (Signed)
PA for Botox was previously approved. Original PA was submitted for 300u/84 day $185.00 copay. If patient insurance did not change, patient can continue with Botox until 3.15.25 with current pharmacy PA. Can be filled at [facility].

## 2023-04-01 NOTE — Telephone Encounter (Signed)
Error

## 2023-04-28 ENCOUNTER — Other Ambulatory Visit: Payer: Self-pay

## 2023-04-28 ENCOUNTER — Other Ambulatory Visit (HOSPITAL_COMMUNITY): Payer: Self-pay | Admitting: Pharmacy Technician

## 2023-04-28 ENCOUNTER — Telehealth: Payer: Self-pay | Admitting: Neurology

## 2023-04-28 ENCOUNTER — Other Ambulatory Visit (HOSPITAL_COMMUNITY): Payer: Self-pay

## 2023-04-28 DIAGNOSIS — G243 Spasmodic torticollis: Secondary | ICD-10-CM

## 2023-04-28 MED ORDER — BOTOX 100 UNITS IJ SOLR
INTRAMUSCULAR | 2 refills | Status: AC
  Filled 2023-04-28: qty 3, fill #0

## 2023-04-28 NOTE — Telephone Encounter (Signed)
PA for Botox was previously approved. Original PA was submitted for 300u/84 day $185.00 copay. If patient insurance did not change, patient can continue with Botox until 3.15.25 with current pharmacy PA. Can be filled at [facility].

## 2023-04-28 NOTE — Telephone Encounter (Signed)
Caller stated she requested to restart Botox about a month ago and has not heard anything back yet. Would like to now update for Botox.

## 2023-04-28 NOTE — Telephone Encounter (Signed)
Pt is sch for 05-09-23 for the injection

## 2023-04-29 ENCOUNTER — Other Ambulatory Visit: Payer: Self-pay

## 2023-05-02 ENCOUNTER — Other Ambulatory Visit: Payer: Self-pay

## 2023-05-06 ENCOUNTER — Other Ambulatory Visit (HOSPITAL_COMMUNITY): Payer: Self-pay

## 2023-05-06 NOTE — Progress Notes (Signed)
Called and left HIPAA compliant message on pt phone to call back to arrange delivery for patients appt coming up on 05/09/23.  However pt may be getting as buy and bill.

## 2023-05-07 ENCOUNTER — Other Ambulatory Visit: Payer: Self-pay

## 2023-05-07 ENCOUNTER — Other Ambulatory Visit (HOSPITAL_COMMUNITY): Payer: Self-pay

## 2023-05-09 ENCOUNTER — Ambulatory Visit (INDEPENDENT_AMBULATORY_CARE_PROVIDER_SITE_OTHER): Payer: Medicare Other | Admitting: Neurology

## 2023-05-09 DIAGNOSIS — G243 Spasmodic torticollis: Secondary | ICD-10-CM

## 2023-05-09 MED ORDER — ONABOTULINUMTOXINA 100 UNITS IJ SOLR
300.0000 [IU] | Freq: Once | INTRAMUSCULAR | Status: AC
Start: 2023-05-09 — End: 2023-05-09
  Administered 2023-05-09: 240 [IU] via INTRAMUSCULAR

## 2023-05-09 NOTE — Procedures (Signed)
[facility]ulin[facility] Clinic   Procedure Note Botox  Attending: Dr. Lurena Joiner Katie Stout  Preoperative Diagnosis(es): Cervical Dystonia  Result History  Has not had injections for almost a year.  Had dysphagia last time and had not had that in long time and scared her.  Now with significant pain.  Told her if has dysphagia again, may try daxxify if insurance approves  Consent obtained from: The patient Benefits discussed included, but were not limited to decreased muscle tightness, increased joint range of motion, and decreased pain.  Risk discussed included, but were not limited pain and discomfort, bleeding, bruising, excessive weakness, venous thrombosis, muscle atrophy and dysphagia.  A copy of the patient medication guide was given to the patient which explains the blackbox warning.  Patients identity and treatment sites confirmed Yes.  .  Details of Procedure: Skin was cleaned with alcohol.  A 30 gauge, 1/2 inch needle was introduced to the target muscle (except splenius capitus, posterior approach, where 27 inch, 1 1/2 gauge needle was used).  Prior to injection, the needle plunger was aspirated to make sure the needle was not within a blood vessel.  There was no blood retrieved on aspiration.    Following is a summary of the muscles injected  And the amount of Botulinum toxin used:   Dilution 0.9% preservative free saline mixed with 100 u Botox type A to make 10 U per 0.1cc  Injections  Location Left  Right Units Number of sites        Sternocleidomastoid 70  70 1  Splenius Capitus, posterior approach  90 90 1  Splenius Capitus, lateral approach  30 30 1   Levator Scapulae      Trapezius 20/20/10  50 3        TOTAL UNITS:   240    Agent: Botulinum Type A ( Onobotulinum Toxin type A ).  3 vials of Botox were used, each containing 100 units and freshly diluted with 1 mL of sterile, non-preserved saline   Total injected (Units): 240  Total wasted (Units): 60   Pt tolerated procedure well  without complications.   Reinjection is anticipated in 3 months.

## 2023-06-02 ENCOUNTER — Telehealth: Payer: Self-pay | Admitting: Neurology

## 2023-06-02 NOTE — Telephone Encounter (Signed)
Great. Noted. Thank you.

## 2023-06-02 NOTE — Telephone Encounter (Signed)
Patient wants Dr.Tat to know she can swallow just fine now. She believes Dr.Tat changed the injection site and it helped her tremendously.

## 2023-06-20 ENCOUNTER — Other Ambulatory Visit: Payer: Self-pay

## 2023-06-24 ENCOUNTER — Other Ambulatory Visit (HOSPITAL_COMMUNITY): Payer: Self-pay

## 2023-06-24 NOTE — Progress Notes (Signed)
Pt receiving through [place] and [address]

## 2023-08-08 ENCOUNTER — Ambulatory Visit: Payer: Medicare Other | Admitting: Neurology

## 2023-09-09 ENCOUNTER — Telehealth: Payer: Self-pay | Admitting: Pharmacy Technician

## 2023-09-09 ENCOUNTER — Other Ambulatory Visit (HOSPITAL_COMMUNITY): Payer: Self-pay

## 2023-09-09 NOTE — Telephone Encounter (Signed)
 Pharmacy Patient Advocate Encounter- Botox BIV-Pharmacy Benefit:  PA was submitted to [facility] and has been approved through: 1.1.25 Authorization# [id number]  Please send prescription to Specialty Pharmacy: [facility]: [phone]  Estimated Copay is: 170  Patient IS NOT eligible for Botox Copay Card, which will make patient's copay as little as zero. Copay card will be provided to pharmacy.

## 2023-09-09 NOTE — Telephone Encounter (Signed)
 Pharmacy Patient Advocate Encounter  BotoxOne verification has been submitted. Benefit Verification #:   [id number]  Pharmacy PA has been submitted for BOTOX 100u via CoverMyMeds. INSURANCE: [facility] [employer] DATE SUBMITTED: 3.4.25 KEY: [id number] Status is pending

## 2023-09-10 ENCOUNTER — Other Ambulatory Visit (HOSPITAL_COMMUNITY): Payer: Self-pay

## 2023-09-10 ENCOUNTER — Other Ambulatory Visit: Payer: Self-pay

## 2023-09-10 NOTE — Telephone Encounter (Signed)
Pharmacy Patient Advocate Encounter- Botox BIV-Medical Benefit:  Buy/Bill J code: N6295 CPT code: 28413 Dx Code: G24.3

## 2023-09-11 ENCOUNTER — Other Ambulatory Visit (HOSPITAL_COMMUNITY): Payer: Self-pay

## 2023-09-11 NOTE — Telephone Encounter (Signed)
 Medical Benefits for FEP BCBS NO PA required. Deductible, copay, and coinsurance are all waived if patient has Medicare as Primary.   If Medicare denies, they will still pay %100 of plan limit maximums.   Call reference# [id number]

## 2023-09-12 ENCOUNTER — Ambulatory Visit (INDEPENDENT_AMBULATORY_CARE_PROVIDER_SITE_OTHER): Payer: Medicare Other | Admitting: Neurology

## 2023-09-12 DIAGNOSIS — G243 Spasmodic torticollis: Secondary | ICD-10-CM | POA: Insufficient documentation

## 2023-09-12 MED ORDER — ONABOTULINUMTOXINA 100 UNITS IJ SOLR
300.0000 [IU] | Freq: Once | INTRAMUSCULAR | Status: AC
  Administered 2023-09-12: 240 [IU] via INTRAMUSCULAR

## 2023-09-12 NOTE — Procedures (Signed)
 [facility]   Procedure Note Botox  Attending: Dr. Lurena Joiner Jahaad Penado  Preoperative Diagnosis(es): Cervical Dystonia  Result History  Did great last time.  No dysphagia.  Waited too long for injections (4 months) and she regretted that b/c noted wearing off.  Consent obtained from: The patient Benefits discussed included, but were not limited to decreased muscle tightness, increased joint range of motion, and decreased pain.  Risk discussed included, but were not limited pain and discomfort, bleeding, bruising, excessive weakness, venous thrombosis, muscle atrophy and dysphagia.  A copy of the patient medication guide was given to the patient which explains the blackbox warning.  Patients identity and treatment sites confirmed Yes.  .  Details of Procedure: Skin was cleaned with alcohol.  A 30 gauge, 1/2 inch needle was introduced to the target muscle (except splenius capitus, posterior approach, where 27 inch, 1 1/2 gauge needle was used).  Prior to injection, the needle plunger was aspirated to make sure the needle was not within a blood vessel.  There was no blood retrieved on aspiration.    Following is a summary of the muscles injected  And the amount of Botulinum toxin used:   Dilution 0.9% preservative free saline mixed with 100 u Botox type A to make 10 U per 0.1cc  Injections  Location Left  Right Units Number of sites        Sternocleidomastoid 70  70 1  Splenius Capitus, posterior approach  90 90 1  Splenius Capitus, lateral approach  30 30 1   Levator Scapulae      Trapezius 20/20/10  50 3        TOTAL UNITS:   240    Agent: Botulinum Type A ( Onobotulinum Toxin type A ).  3 vials of Botox were used, each containing 100 units and freshly diluted with 1 mL of sterile, non-preserved saline   Total injected (Units): 240  Total wasted (Units): 60   Pt tolerated procedure well without complications.   Reinjection is anticipated in 3 months.

## 2023-12-12 ENCOUNTER — Ambulatory Visit: Admitting: Neurology

## 2023-12-19 ENCOUNTER — Ambulatory Visit: Admitting: Neurology

## 2023-12-19 ENCOUNTER — Ambulatory Visit (INDEPENDENT_AMBULATORY_CARE_PROVIDER_SITE_OTHER): Admitting: Neurology

## 2023-12-19 DIAGNOSIS — G243 Spasmodic torticollis: Secondary | ICD-10-CM

## 2023-12-19 MED ORDER — ONABOTULINUMTOXINA 100 UNITS IJ SOLR
300.0000 [IU] | Freq: Once | INTRAMUSCULAR | Status: AC
  Administered 2023-12-19: 240 [IU] via INTRAMUSCULAR

## 2023-12-19 NOTE — Procedures (Signed)
 [facility]   Procedure Note Botox   Attending: Dr. Ivette Marks Aleayah [place]  Preoperative Diagnosis(es): Cervical Dystonia  Result History  Did great last time.  No dysphagia.  Just noted wearing off over last few days  Consent obtained from: The patient Benefits discussed included, but were not limited to decreased muscle tightness, increased joint range of motion, and decreased pain.  Risk discussed included, but were not limited pain and discomfort, bleeding, bruising, excessive weakness, venous thrombosis, muscle atrophy and dysphagia.  A copy of the patient medication guide was given to the patient which explains the blackbox warning.  Patients identity and treatment sites confirmed Yes.  .  Details of Procedure: Skin was cleaned with alcohol.  A 30 gauge, 1/2 inch needle was introduced to the target muscle (except splenius capitus, posterior approach, where 27 inch, 1 1/2 gauge needle was used).  Prior to injection, the needle plunger was aspirated to make sure the needle was not within a blood vessel.  There was no blood retrieved on aspiration.    Following is a summary of the muscles injected  And the amount of Botulinum toxin used:   Dilution 0.9% preservative free saline mixed with 100 u Botox  type A to make 10 U per 0.1cc  Injections  Location Left  Right Units Number of sites        Sternocleidomastoid 70  70 1  Splenius Capitus, posterior approach  90 90 1  Splenius Capitus, lateral approach  30 30 1   Levator Scapulae      Trapezius 20/20/10  50 3        TOTAL UNITS:   240    Agent: Botulinum Type A ( Onobotulinum Toxin type A ).  3 vials of Botox  were used, each containing 100 units and freshly diluted with 1 mL of sterile, non-preserved saline   Total injected (Units): 240  Total wasted (Units): 60   Pt tolerated procedure well without complications.   Reinjection is anticipated in 3 months.

## 2023-12-19 NOTE — Addendum Note (Signed)
 Addended by: Ortencia Blamer C on: 12/19/2023 03:10 PM   Modules accepted: Orders

## 2024-01-12 ENCOUNTER — Telehealth: Payer: Self-pay | Admitting: Neurology

## 2024-01-12 NOTE — Telephone Encounter (Signed)
 Called pt and informed her of Dr. Martie results. She understood and asked of o2 being at 15  should be concerning. I told her the normal is 90 - 100. But if she is concern please go the the UC or ER as advised by Dr. Evonnie.  She understood.

## 2024-01-12 NOTE — Telephone Encounter (Signed)
 Patient states that 3 weeks after her botox  injection starting July  4th  she states that she is having pain in all of joints and muscles  and is having a little short of breath and she is concerned that it is from the botox . She feels like something was attacking her  no fever

## 2024-01-27 NOTE — Progress Notes (Signed)
 [facility] [place] [address] [place], [place] [address] Tel: [phone]   Fax: [phone]            FOLLOW UP VISIT  Subjective/HPI:   Chief Complaint  Patient presents with  . Shoulder Pain  . Fatigue    Katie Stout is a pleasant 78 y.o. female who was seen in follow up of the above listed chief complaint and ongoing chronic conditions.  Current concerns include:   Patient seemed weak, frustrated and tearful during in room visit, but was too weak to ambulate to rest room or out of office without assistance. Nursing noted confusion in how to navigate our restroom and patient subsequent endorsed being confused about how to use the elevator (not typical for her). Advised ER evaluation would be recommended as she has acutely worsened compared to prior visit, and she does report today is the worst day; I do not think she would be able to safely navigate back home. Daughter aware.   Fatigue. She reports she is feeling weak and feels she is going to have to get another first alert.  She reports something is wrong; not feeling well at all. Becoming tearful. Has been like this for 2-3 weeks. When it started; felt a little weak, now trembles.  Reports her daughter told her she is 'getting old' but she reports this is something different.  Notes she is gaining central adiposity. Has remote h/o pituitary tumor and amenorrhea; reports she worries this could be the cause and would like to have prolactin checked.  She reports this is not having the energy to get up to do anything; when she gets herself in for a shower. She reports she can't take out her trash. She then feels weak, like she could fall, and feels short of breath.  Had Botox  about 5 weeks ago.  When walking without her cane, she feels her head is also falling over/leaning forward and reports this makes her feel like she is going to fall.  Oxygen level is low today.  Went  to [facility] 7/7 for this as well.  Denies flutters/chest pressure/palpitations. CXR 7/7 demonstrated improved cardiomegaly and resolved pulmonary edema.  Feels OK when sitting. Sleeps in her recliner because it is hard for her to get up and go. When lying flatter than normal, she gets a feeling her head like she is going to pass out. This resolves as soon as she sits up. Has had some new headaches.   No new trouble with bladder control; has some leakage at baseline. She is not sleepy/tired, but rather feels drained.  Struggling with driving and leaving the home.  Neck pain. Has known torticollis; reports she looked this up and is worried about MS as well. Worries that they could be related, starting at 36-55 years old.  Had Botox  about 5 weeks ago. She describes tingling; previously for years in right more than left hand, now happening numerous times and both hands will go numb and tingle/hurt. She reports she can't feel her slippers on her feet as well and reports they feel like they are numb.   Review of Systems: As per HPI.  Past Medical History, Social History and Family History: Reviewed and updated as appropriate. Tobacco Use History[1]  Allergies: Allergies[2]  Objective/Physical Examination:   Vitals:   01/27/24 1335 01/27/24 1336  BP: 158/64 134/65  BP Location: Left arm Left arm  Patient Position: Sitting Sitting  Pulse: 101  Resp: 19   Temp: 99.1 F (37.3 C)   TempSrc: Temporal   SpO2: 94%   Weight: 95.9 kg (211 lb 6.4 oz)      Physical Exam Vitals reviewed.  Constitutional:      General: She is not in acute distress.    Appearance: She is ill-appearing.  HENT:     Head: Normocephalic and atraumatic.  Neck:     Comments: Abnormal position of neck in a 'leaning forward' position worse than baseline Cardiovascular:     Rate and Rhythm: Regular rhythm. Tachycardia present.     Heart sounds: Murmur heard.     No friction rub. No gallop.  Pulmonary:     Effort:  Pulmonary effort is normal.     Breath sounds: Normal breath sounds. No wheezing, rhonchi or rales.     Comments: Chronic hoarseness Abdominal:     Palpations: Abdomen is soft.     Tenderness: There is no abdominal tenderness. There is no guarding or rebound.   Musculoskeletal:     Cervical back: Tenderness present.   Neurological:     Mental Status: She is alert.     Cranial Nerves: Cranial nerves 2-12 are intact.     Motor: Weakness and tremor present.     Coordination: Romberg sign negative.     Gait: Gait abnormal.     Comments: Confused about how to use/navigate restroom, how to use elevator in office.   Psychiatric:        Speech: Speech normal.        Behavior: Behavior is cooperative.     Assessment & Plan:   Patient Instructions  Current Planning: Nursing staff noted confusion in office when taking patient to the restroom; she was not able to orient self/navigate bathroom without assistance. She was also to weak to walk back out of the office, and subsequently narrated that she was not able to use the buttons in the elevator to navigate to our floor today; (this is atypical). Discussed concerns with patient that she is not safe to drive/go home and this is acutely atypical of her behavior and function. She requested I speak with her daughter, Burnard, before calling EMS/sending to ER as I do not think she should self-transport due to safety concerns. Daughter in agreement and EMS activated; patient prefers [facility] for assessment and daughter in agreement there as well.   Outpatient Planning: Urgent Cardiology referral given worsening shortness of breath/activity tolerance to rule out worsening cardiac disease Xray cervical spine today given bilateral hand numbness/foot numbness, as well as MRI of the cervical spine and brain given concerns as noted including h/o benign brain tumor, gait changes, etc.  PFT testing given relatively low oxygen level, will see if you  can do walk test with this as well Repeat labs ordered today with adjustment based on results Hold Aleve for 1-2 weeks and see how you are doing Likely small component of vertigo, but don't think this is a big part of what is going on; query if Botox  could be contributing to this as well (head appears to be leaning forward at this time as well to me)   Luwanna was seen today for shoulder pain and fatigue.  Diagnoses and all orders for this visit:  Confusion -     Ammonia; Future  Weakness -     CBC with Differential; Future -     Comprehensive Metabolic Panel; Future -     TSH; Future -  MRI Spine Cervical W And WO Contrast; Future -     Ferritin; Future -     Vitamin B12; Future -     Lyme Disease Total Antibody with Reflex to Immunoassay; Future  DOE (dyspnea on exertion) -     CBC with Differential; Future -     Comprehensive Metabolic Panel; Future -     TSH; Future -     B-Type Natriuretic Peptide (BNP); Future -     Ferritin; Future -     Ambulatory referral to Cardiology; Future -     AH Wake-Pulmonary Function Test Standard PFT (Spirometry, Lung Volumes, Carbon Monoxide Diffusing Capacity); 6 Minute Walk Test; Future -     Ambulatory referral to Home Health; Future  Fatigue, unspecified type  Bilateral hand numbness -     MRI Spine Cervical W And WO Contrast; Future  Gait instability -     CBC with Differential; Future -     MRI Brain W And WO Contrast; Future -     MRI Spine Cervical W And WO Contrast; Future -     Ambulatory referral to Home Health; Future  Elevated bilirubin -     Bilirubin (Total, Direct, Indirect); Future -     Ferritin; Future  Malaise -     CBC with Differential; Future  Tachycardia -     TSH; Future  History of prolactinoma -     Prolactin; Future -     MRI Brain W And WO Contrast; Future  Degenerative disc disease, cervical -     XR Spine Cervical 2-3 Views; Future -     MRI Spine Cervical W And WO Contrast; Future -      Ambulatory referral to Home Health; Future  Spastic torticollis -     XR Spine Cervical 2-3 Views; Future -     MRI Spine Cervical W And WO Contrast; Future  Subclinical hypothyroidism -     Ferritin; Future  Essential hypertension -     Ferritin; Future -     Ambulatory referral to Home Health; Future  Dizziness -     Ferritin; Future -     Ambulatory referral to Home Health; Future  Pain in finger of both hands -     Ferritin; Future  Obesity, morbid (CMD)    Return if symptoms worsen or fail to improve.  Current Meds Medications Ordered Prior to Encounter[3]  Active Problems Problem List[4]  On the day of the visit I spent 73 minutes preparing to see the patient, obtaining and/or reviewing separately obtained history, performing a medically appropriate examination and evaluation, counseling and educating the patient/caregiver, ordering medications, test, or procedures, referring and communicating with other health care professionals (not separately reported), individually interpreting results (not separately reported) and communicating results to the patient/caregiver, and care coordination (not separately reported) .This time does not include any time spent performing procedures or assesments that are separately billable.    Electronically signed by: Lonell JONELLE Mari, MD 01/27/2024 3:02 PM       [1] Social History Tobacco Use  Smoking Status Never  Smokeless Tobacco Never  Tobacco Comments   Lived for years with smokers  [2] Allergies Allergen Reactions  . Codeine Nausea And Vomiting and GI Intolerance  . Penicillins Hives  . Doxycycline  Other (See Comments)    Reaction: Bladder Spasms (intolerance)   . Sulfa  (Sulfonamide Antibiotics) Hives  . Adhesive Dermatitis  . Cephalexin Diarrhea  . Latex Rash  [3] Current  Outpatient Medications on File Prior to Visit  Medication Sig Dispense Refill  . ALPRAZolam  (XANAX ) 0.25 mg tablet Take 0.5-1 tablets (0.125-0.25  mg total) by mouth nightly as needed for sleep or anxiety. 30 tablet 0  . calcium  carbonate (Tums E-X) 300 mg (750 mg) chewable tablet Take 750 mg by mouth 3 (three) times a day as needed (heartburn).    . cetirizine (ZyrTEC) 10 mg tablet Take 10 mg by mouth daily.    . cholecalciferol , vitamin D3, (Vitamin D3) 100 mcg (4,000 unit) cap Take  by mouth Once Daily.    SABRA estradioL (ESTRACE) 0.01 % (0.1 mg/gram) vaginal cream Insert 1 gram (pea sized amount) vaginally daily for 2 weeks, then 3 times weekly. 42.5 g 11  . fluticasone  propionate (FLONASE ) 50 mcg/spray nasal spray Administer 2 sprays into each nostril Once Daily. 48 mL 3  . hyoscyamine  (LEVSIN /SL) 0.125 mg sublingual tablet PLACE 1 TABLET (0.125 MG TOTAL) UNDER THE TONGUE EVERY 6 (SIX) HOURS AS NEEDED (CRAMPING). 360 tablet 1  . metoprolol succinate (TOPROL XL) 50 mg 24 hr tablet Take 1 tablet (50 mg total) by mouth 2 (two) times a day. 180 tablet 0  . neomycin-polymyxin B-dexamethasone  (POLYDEX) 3.5 mg/g-10,000 unit/g-0.1 % oint Use in both eyes at night for 2 weeks then stop 3.5 g 3  . omeprazole  (PriLOSEC) 20 mg DR capsule Take 1 capsule (20 mg total) by mouth daily before breakfast. 90 capsule 3  . onabotulinumtoxin type A (Botox ) 100 unit solr INJECT 300 UNITS INTO PATIENTS HEAD AND NECK EVERY 90 DAYS BY DR. TAT    . triamcinolone  acetonide (KENALOG ) 0.1 % ointment Apply to the scaly itchy areas of the hands nightly, twice a day if needed. Avoid use to normal skin. Do not use on face. 80 g 4  . vit C,E-Zn-coppr-lutein-zeaxan (PreserVision AREDS-2) 250-90-40-1 mg cap capsule Take 1 capsule by mouth 2 (two) times a day.    . rosuvastatin  (CRESTOR ) 10 mg tablet TAKE 1 TABLET BY MOUTH EVERY DAY 90 tablet 1  . [DISCONTINUED] metFORMIN (GLUCOPHAGE-XR) 500 mg 24 hr tablet Take 1 tablet (500 mg total) by mouth daily with breakfast. May increase to 2 tabs in 2 weeks if needed/tolerated. 60 tablet 2  . [DISCONTINUED] triamcinolone  acetonide  (KENALOG ) 0.1 % cream Apply to the scaly itchy areas of the hands nightly, twice a day if needed. Avoid use to normal skin. Do not use on face. 80 g 3   No current facility-administered medications on file prior to visit.  [4] Patient Active Problem List Diagnosis  . Cervical dystonia  . Allergic rhinitis  . Degenerative disc disease, cervical  . Depression  . Essential hypertension  . Gastroesophageal reflux disease without esophagitis  . Mixed hyperlipidemia  . Insomnia  . Spastic torticollis  . Systolic murmur  . Vocal fold atrophy  . Anterior glottic web of larynx  . Asymmetrical sensorineural hearing loss  . Subjective tinnitus, bilateral  . Imbalance  . GAD (generalized anxiety disorder)  . Primary osteoarthritis of both knees  . Generalized weakness  . Muscular deconditioning  . Benign tumor of pituitary gland (HCC)  . SCCA (squamous cell carcinoma) of skin  . Vitamin D  deficiency  . Rotator cuff tear arthropathy of both shoulders  . Dysphonia  . Hiatal hernia  . Esophageal spasm  . Acute hand eczema  . Arthralgia of left acromioclavicular joint  . Erythrocytosis  . Iron  deficiency anemia  . Situational anxiety  . Vaginal irritation  . Atrophic  vaginitis  . Chronic pain of right ankle  . Wears hearing aid in both ears  . Dry mouth  . Primary osteoarthritis involving multiple joints  . Peripheral edema  . Subclinical hypothyroidism  . Chronic fatigue  . At risk for fall due to comorbid condition  . Impaired functional mobility, balance, gait, and endurance  . Chronic pain of both knees  . Varicose veins of both legs with edema  . History of breast abscess  . Dizziness  . Acute frontal sinusitis  . Episodic recurrent vertigo  . Subacromial impingement of left shoulder  . Non-healing non-surgical wound  . Intertrigo  . Cherry angioma  . Multiple benign nevi  . Seborrheic keratoses  . IFG (impaired fasting glucose)  . Scar of lower leg  . Acquired  hypothyroidism  . Other atopic dermatitis  . Pre-syncope  . Abnormality of gait  . Dysesthesia of scalp  . Postinflammatory hyperpigmentation

## 2024-01-27 NOTE — ED Provider Notes (Signed)
 [facility] Emergency Medicine Care Note       Chief Complaint  Patient presents with  . Altered Mental Status  . Shortness of Breath    History   Katie Stout is a 78 y.o. with the PMHx HTN, torticollis who presents to the ED with a chief complaint of weakness and an episode of confusion. Please see MDM for full history  Medical History[1] Surgical History[2] Family History[3] Social History[4]    Physical Exam   Physical Exam ED Triage Vitals  Temp 01/27/24 1546 98.3 F (36.8 C)  Heart Rate 01/27/24 1546 93  Resp 01/27/24 1546 18  BP 01/27/24 1555 (!) 145/59  MAP (mmHg) 01/27/24 1715 94  SpO2 01/27/24 1546 94 %  O2 Device --   O2 Flow Rate (L/min) --   Weight 01/27/24 1546 95.7 kg (211 lb)   Physical Exam Vitals and nursing note reviewed.  Constitutional:      General: She is not in acute distress.    Appearance: She is not ill-appearing.  HENT:     Head: Normocephalic and atraumatic.   Eyes:     Pupils: Pupils are equal, round, and reactive to light.    Cardiovascular:     Rate and Rhythm: Normal rate.  Pulmonary:     Effort: Pulmonary effort is normal. No tachypnea.  Abdominal:     Palpations: Abdomen is soft.     Tenderness: There is no abdominal tenderness.   Musculoskeletal:     Cervical back: Neck supple.     Right lower leg: No edema.     Left lower leg: No edema.   Skin:    General: Skin is warm and dry.   Neurological:     Mental Status: She is alert and oriented to person, place, and time.   Psychiatric:        Behavior: Behavior normal.        Scoring Tools Utilized    CHA2DS2-VASc Score: N/A  Glasgow Coma Scale Score: 15    NIH Stroke Scale: 0                 Procedures Performed   Procedures                        Medical Decision Making Problems Addressed: Confusion: complicated acute illness or injury Weakness: complicated acute illness or injury  Amount and/or Complexity of Data  Reviewed Labs: ordered. Radiology: ordered. ECG/medicine tests: ordered.  Risk Prescription drug management.      ED Course and Medical Decision Making    Katie Stout is a 78 y.o. with the PMHx HTN, torticollis who presents to the ED with a chief complaint of weakness and an episode of confusion.  Patient had a visit with her primary care doctor today and was noted to have some confusion and was reporting 2 to 3 weeks of not feeling well and generalized weakness.  No inciting or trauma.  She reports weakness to her legs and intermittent clumsiness with her hands and tingling in her hands and feet.  States at the doctor's office she had some confusion with the elevator but is able to recall this at this time.     On initial exam she is alert and oriented x 4.  She has a normal, neuroexam with 5/5 symmetric strength and no sensory changes.  2+ distal pulses.  Labs show: No signicant leukocytosis.  No significant anemia.  No  significant electrolyte derangements.  No evidence of AKI.  Normal T. bili and LFTs. UA without evidence of infection. COVID/Flu negative.  TSH WNL.  Not consistent with myxedema coma or thyrotoxicosis.  BNP WNL.  Chest x-ray shows no acute abnormalities.  Specifically no evidence of pneumonia, pneumothorax, pleural effusion, pulmonary edema.   MRI brain shows:  1. No acute intracranial abnormality.  2. Findings consistent with mild to moderate sequela of chronic microvascular disease with several remote lacunar infarcts noted in the basal ganglia.  MRI cervical spine shows: 1.  No high grade canal or foraminal stenosis within the cervical spine. 2.  Slight interval progression of multilevel cervical spondylosis with mild to moderate canal stenosis at C5-C6 and C6-C7. 3.  Interval development of C5/C6 hyperintense cord signal at C5-C6 may reflect myelomalacia given the associated degenerative changes at this level versus cord edema/injury if there is history of  recent trauma. 4.  Multilevel foraminal stenosis, most pronounced and moderate bilaterally at C5-C6 and on the right at C6-C7.   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  Do not feel this is consistent with acute cord compression.  Will place a spine referral and have patient follow-up with her PCP for further workup.   At this time feel her symptoms can be secondary to remote basal ganglia strokes as well as chronic cervical cord compression.  Fully discussed this with patient and her daughter at bedside.  All questions answered.  Patient stable for discharge.    Medications Delivered During ED Care   Medications  LORazepam (ATIVAN) tablet 0.5 mg (0.5 mg oral Given 01/27/24 1758)  gadobutroL (GADAVIST) INTRAVENOUS SOLUTION (SDV) soln 8 mL (8 mL intravenous Given 01/27/24 1837)     Clinical Complexity  Patient's presentation is most consistent with acute presentation with potential threat to life or bodily function   Patient does have comorbidities/underlying diseases which makes the patient's presentation today of weakness higher risk, increases risk of complications and morbidity/mortality of patient management, and ultimately increases complexity of their visit today.  Multiple problems were addressed during today's visit as outlined above.  Independent Historian Additional history/information obtained from daughter, EMS  External Data Reviewed  PCP notes PDMP/NARC score reviewed- No concerning pattern identified   Cardiac Monitor Interpretation The Patient was maintained on a cardiac monitor.  I personally viewed and interpreted the cardiac monitor which showed an underlying rhythm of sinus with a rate of 70s  ECG interpretation ECG was personally viewed and interpreted:  NSR with a rate of 83. No change from prior  Social Determinants of Health  Social Drivers of Health with Concerns   Living Situation: Not on file  (01/27/2024)  Transportation Needs: Not on file (01/27/2024)  Food Insecurity: Unknown (01/27/2024)   Food vital sign   . Within the past 12 months, you worried that your food would run out before you got money to buy more: Patient declined to answer   . Within the past 12 months, the food you bought just didn't last and you didn't have money to get more: Patient declined to answer  Utilities: Unknown (01/27/2024)   Utilities   . In the past 12 months has the electric, gas, oil, or water company threatened to shut off services in your home? : Patient declined to answer    Hospitalization Decision Considered hospitalization, however, evaluation and diagnostic testing in the emergency department does not suggest an emergent condition requiring admission or immediate  intervention beyond what has been performed at this time.  The patient is safe for discharge and has been instructed to return immediately for worsening symptoms, change in symptoms or any other concerns.  Discharge Medications Recommended   Discharge Medication List as of 01/27/2024  7:42 PM      OTC recommendation: tylenol   Clinical Impression   1. Weakness      2. Confusion         ED Disposition   Discharge   Old records reviewed. Labs, imaging, and EKG reviewed and interpreted by myself and used in the MDM  Addressed patient questions and concerns.  Reviewed discharge instructions with strict precautions given.  Advised patient to schedule follow-up with primary care doctor.  Patient verbalized understanding and agrees with plan.  Patient stable at discharge.    Katie Stout        [1] Past Medical History: Diagnosis Date  . Acid reflux   . Allergic   . Allergic rhinitis   . Allergy   . Anxiety   . Arthritis   . Bleeding from the nose   . Cervical dystonia   . Colon polyp 09/2018   Non pre-cancer  . Community acquired bacterial pneumonia 07/16/2020  . Diverticulitis of colon   . Dysphagia    . Eczema   . Elevated cholesterol   . Fractures 01/2000   Left arm  . H/O: pituitary tumor   . Heart murmur   . Hematuria   . Hiatal hernia   . HL (hearing loss)   . Hoarseness of voice   . Hypertension   . Irritable bowel syndrome   . Joint pain   . Kidney stone   . Lesion of vocal cord   . Obesity   . Osteopenia after menopause 02/06/2023  . Osteoporosis   . Psoriasis   . Rhinitis   . SCCA (squamous cell carcinoma) of skin 06/21/2020  . Subclinical hypothyroidism 08/02/2021  . Urinary tract infection    No sure when.  . Varicella   . Visual impairment   [2] Past Surgical History: Procedure Laterality Date  . BRAIN SURGERY  11/1981   Pituitary tumor removal  . CATARACT EXTRACTION Bilateral    Procedure: CATARACT EXTRACTION  . CESAREAN SECTION, CLASSICAL  08/07/1983  . CESAREAN SECTION, UNSPECIFIED     Procedure: CESAREAN SECTION  . COLONOSCOPY     Procedure: COLONOSCOPY  . EYE SURGERY  07/2005   Cataracts  . FRACTURE SURGERY  01/2000   Left arm  . HYSTERECTOMY      Procedure: HYSTERECTOMY; total  . LARYNX SURGERY     Procedure: LARYNX SURGERY; removed growth on larynx 2013  . PITUITARY SURGERY     Procedure: PITUITARY SURGERY; tumor removed  . SKIN BIOPSY     Procedure: SKIN BIOPSY  . SKIN CANCER EXCISION  2020   Lower left arm  . TOENAIL EXCISION  02/2023  . TOTAL ABDOMINAL HYSTERECTOMY  1993  . UPPER GASTROINTESTINAL ENDOSCOPY  09/2018  . VEIN SURGERY  01/2022  [3] Family History Problem Relation Name Age of Onset  . Heart attack Mother Deceased   . Coronary artery disease Mother Deceased   . Cancer Mother Deceased   . Glaucoma Mother Deceased   . Heart disease Mother Deceased   . Colon cancer Mother Deceased   . Breast cancer Mother Deceased   . Blood disease Father Deceased   . Macular degeneration Father Deceased   . Skin cancer Father Deceased   .  Arthritis Father Deceased   . Hypertension Father Deceased   . Stroke Father Deceased   .  Vision loss Father Deceased   . Cancer Brother Alm JULIANNA Hint        Deceased  . Neuropathy Neg Hx    [4] Social History Tobacco Use  . Smoking status: Never  . Smokeless tobacco: Never  . Tobacco comments:    Lived for years with smokers  Vaping Use  . Vaping status: Never Used  Substance Use Topics  . Alcohol use: Not Currently  . Drug use: Never

## 2024-01-28 ENCOUNTER — Telehealth: Payer: Self-pay | Admitting: Neurology

## 2024-01-28 NOTE — Telephone Encounter (Signed)
 Nurse for primary care called our office and stated that patient visited their office yesterday complaining about fatigue, shortness of breath, joint pain, neck pain.  Had noted that perhaps it had been going on since her Botox  injections.  Last Botox  injections were June 13.  Patient ultimately went to the emergency room.  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  Otherwise, emergency room workup was unremarkable.  Emergency room physician felt that findings on MRI probably chronic, but were new compared to her 2015 examination (and myelomalacia was new compared to our 2017 examination).  Patient did get a referral to neurosurgery from [facility].  The nurse from primary care stated that I could call the primary care physician on her cell phone, which I did, but was not able to get a hold of her.  I left a detailed message and gave my cell phone to call back.

## 2024-03-19 ENCOUNTER — Ambulatory Visit: Admitting: Neurology
